# Patient Record
Sex: Female | Born: 1935 | Race: White | Hispanic: No | Smoking: Former smoker
Health system: Southern US, Community
[De-identification: ages and names within clinical notes are randomized; demographics above are authoritative.]

## PROBLEM LIST (undated history)

## (undated) DIAGNOSIS — E079 Disorder of thyroid, unspecified: Secondary | ICD-10-CM

## (undated) DIAGNOSIS — I10 Essential (primary) hypertension: Secondary | ICD-10-CM

## (undated) DIAGNOSIS — F0390 Unspecified dementia without behavioral disturbance: Secondary | ICD-10-CM

## (undated) DIAGNOSIS — K279 Peptic ulcer, site unspecified, unspecified as acute or chronic, without hemorrhage or perforation: Secondary | ICD-10-CM

## (undated) DIAGNOSIS — E039 Hypothyroidism, unspecified: Secondary | ICD-10-CM

## (undated) DIAGNOSIS — K219 Gastro-esophageal reflux disease without esophagitis: Secondary | ICD-10-CM

## (undated) DIAGNOSIS — I2510 Atherosclerotic heart disease of native coronary artery without angina pectoris: Secondary | ICD-10-CM

## (undated) DIAGNOSIS — M1990 Unspecified osteoarthritis, unspecified site: Secondary | ICD-10-CM

## (undated) HISTORY — PX: ABDOMINAL HYSTERECTOMY: SHX81

---

## 2009-04-02 ENCOUNTER — Ambulatory Visit (HOSPITAL_COMMUNITY): Admission: RE | Admit: 2009-04-02 | Discharge: 2009-04-02 | Payer: Self-pay | Admitting: Internal Medicine

## 2009-04-02 IMAGING — CR DG CHEST 2V
2 series · 2 of 2 positions shown · non-contrast
Comparison: None available.

CLINICAL DATA: Cough.

CHEST - 2 VIEW

[w chest pa]
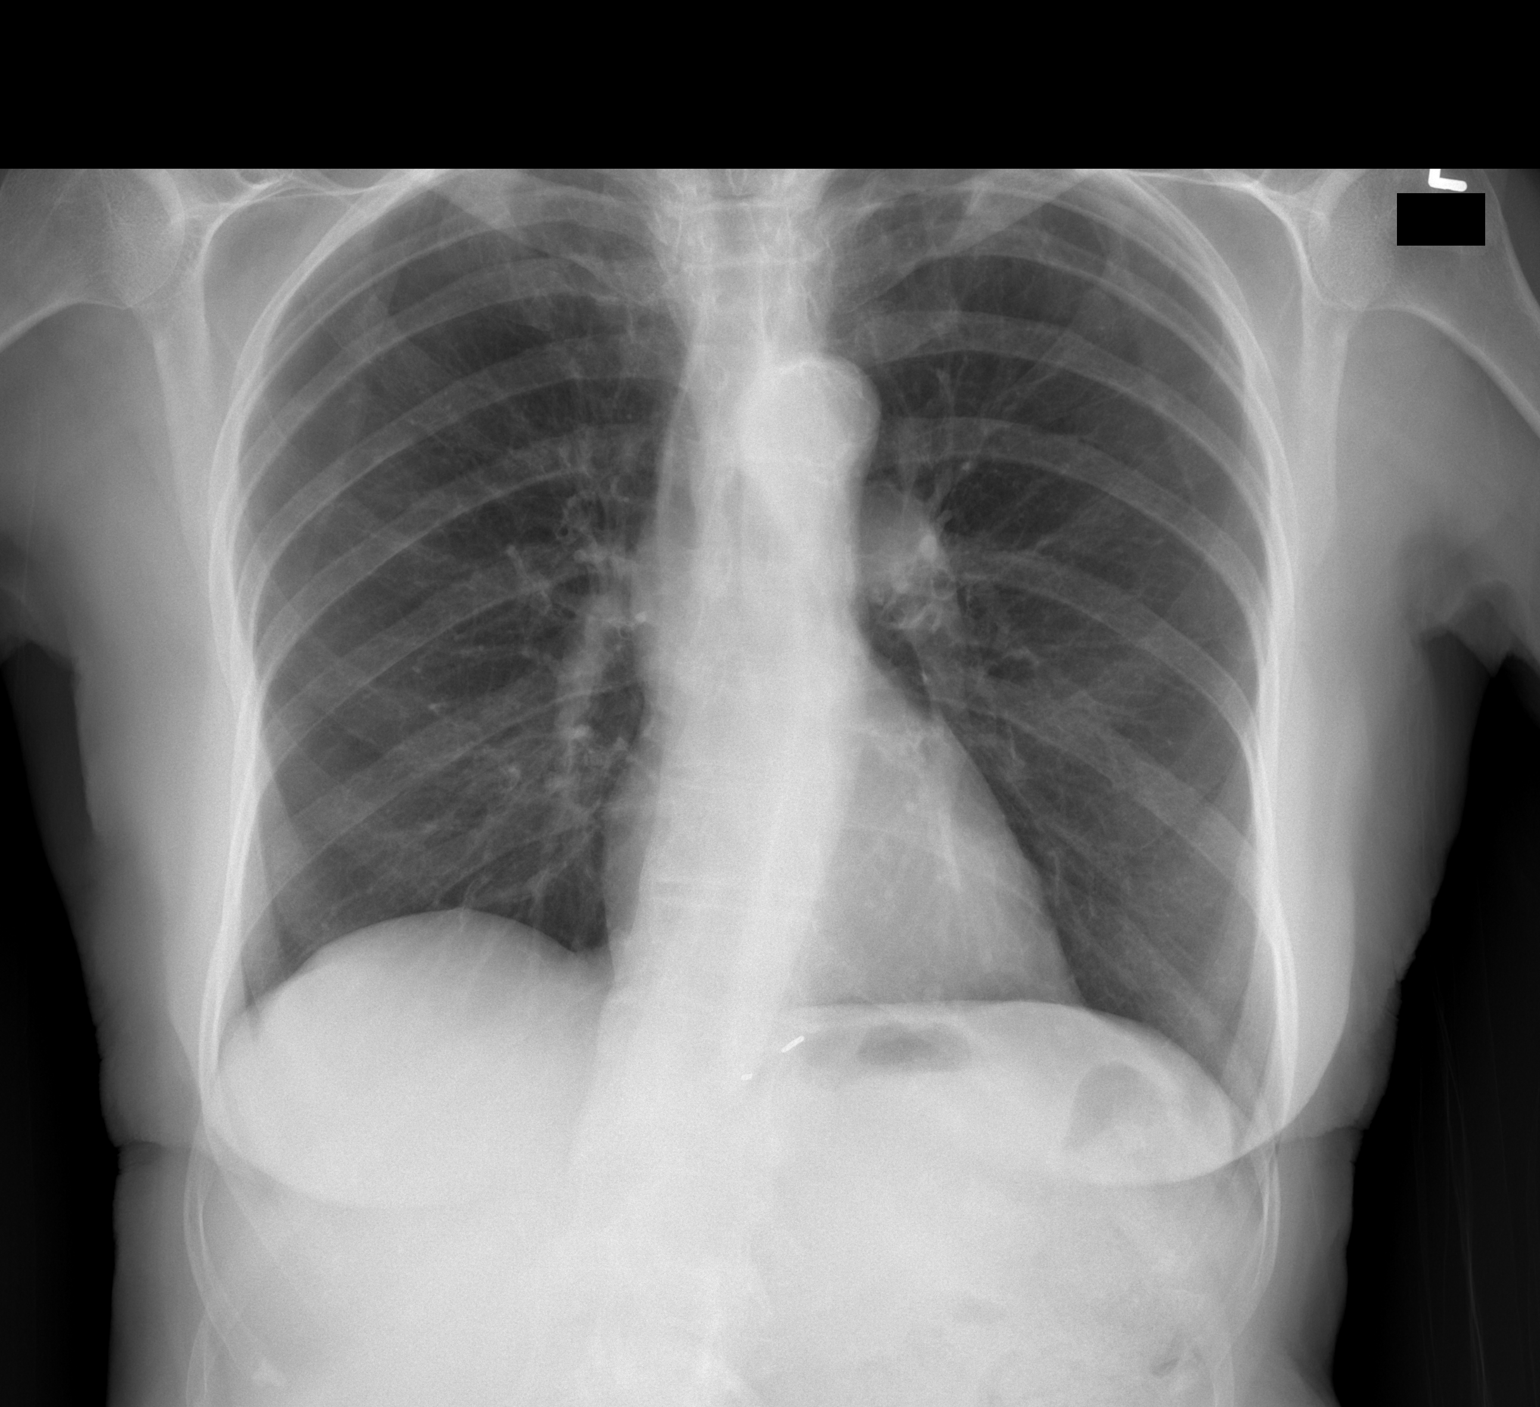

[w chest lat]
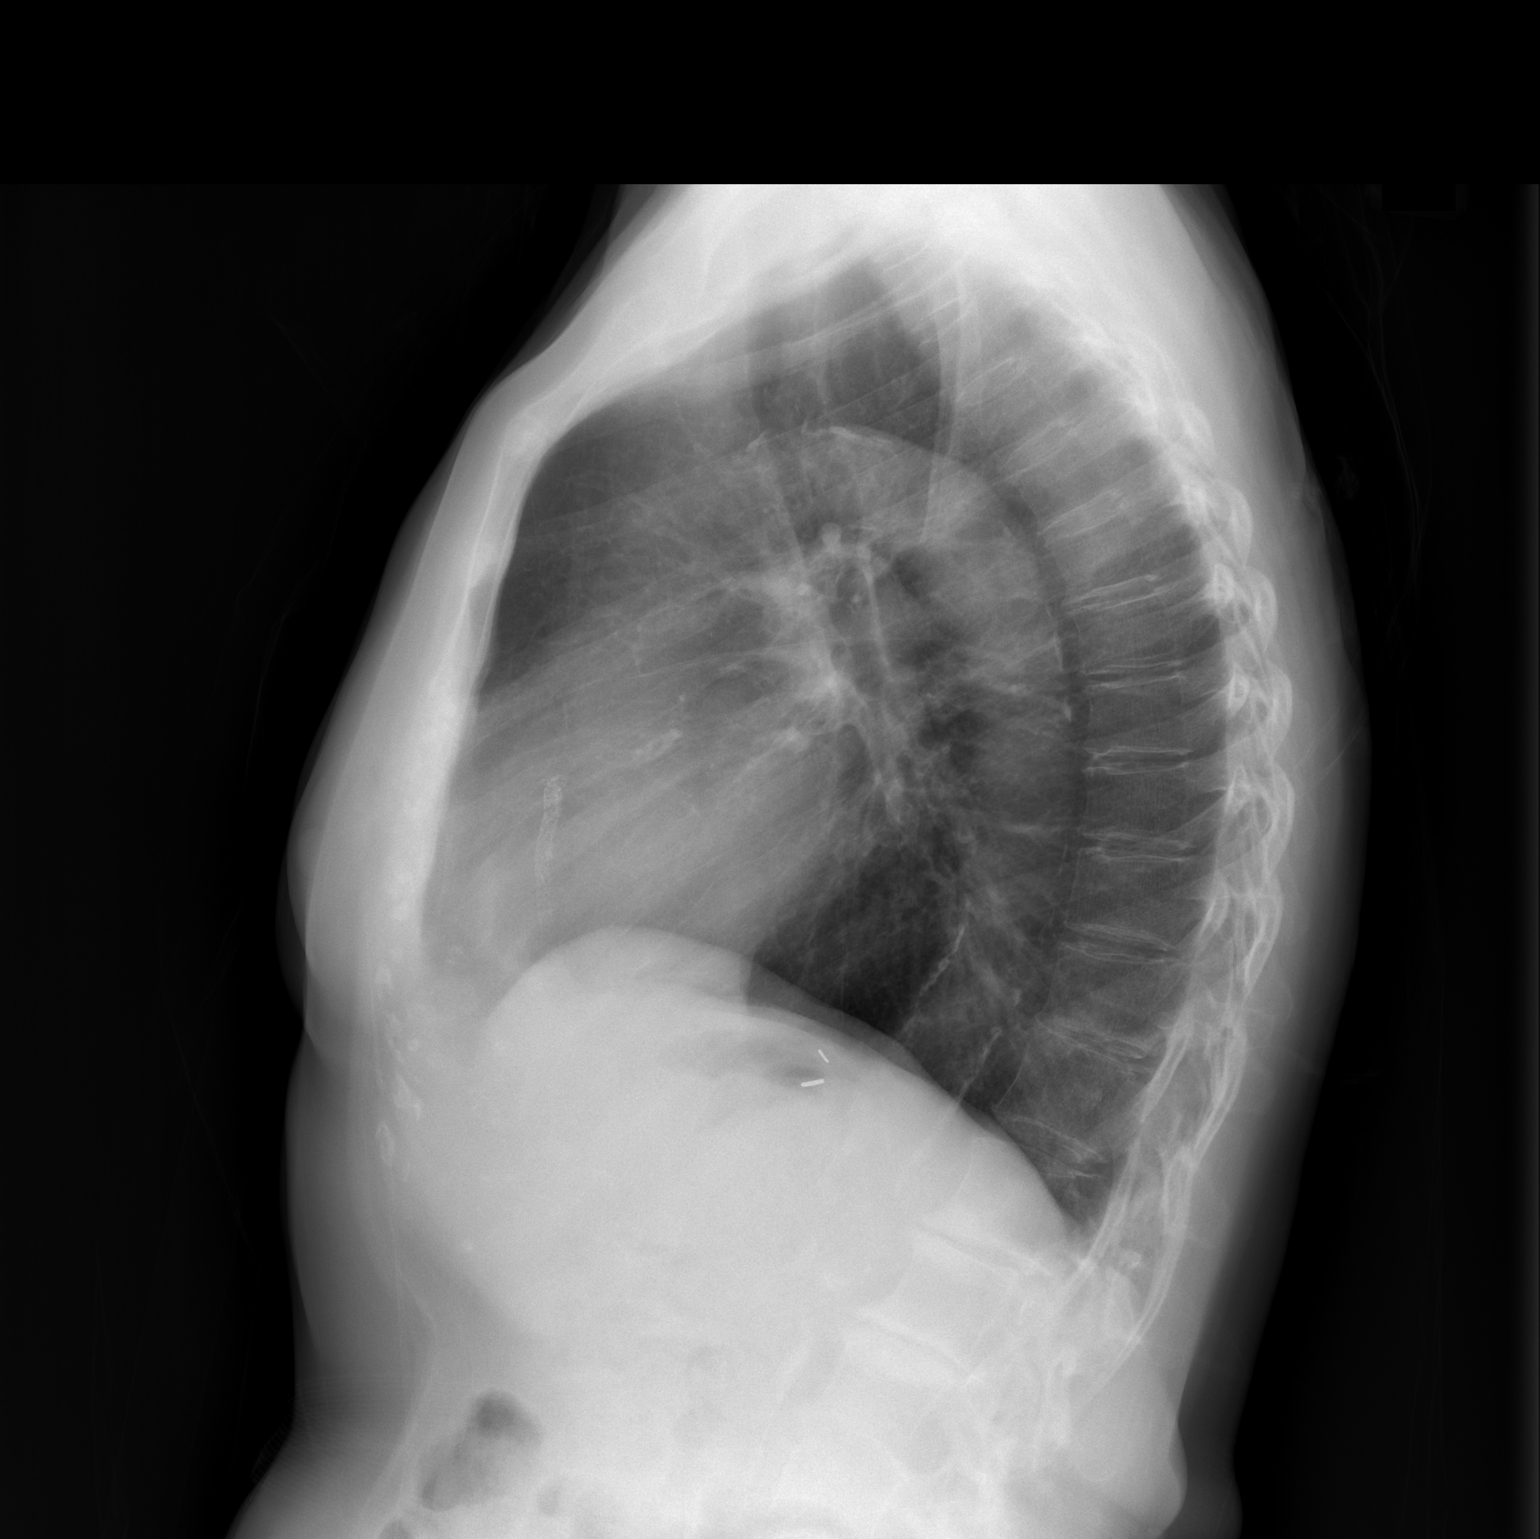

[2 of 2 positions shown; findings below may reference images not displayed]

FINDINGS: Lungs are clear.  Heart size is normal.  Coronary artery
stent noted.  No pleural effusion.
IMPRESSION: No acute disease.

## 2012-01-20 ENCOUNTER — Other Ambulatory Visit: Payer: Self-pay | Admitting: Internal Medicine

## 2012-01-23 ENCOUNTER — Other Ambulatory Visit: Payer: Self-pay | Admitting: Internal Medicine

## 2012-01-24 ENCOUNTER — Ambulatory Visit
Admission: RE | Admit: 2012-01-24 | Discharge: 2012-01-24 | Disposition: A | Discharge status: Home / Self Care | Payer: Medicare Other | Source: Ambulatory Visit | Attending: Internal Medicine | Admitting: Internal Medicine

## 2012-01-24 MED ORDER — IOHEXOL 300 MG/ML  SOLN
100.0000 mL | Freq: Once | INTRAMUSCULAR | Status: AC | PRN
  Administered 2012-01-24: 100 mL via INTRAVENOUS

## 2012-01-29 ENCOUNTER — Other Ambulatory Visit: Payer: Self-pay

## 2013-03-08 ENCOUNTER — Encounter (HOSPITAL_COMMUNITY): Payer: Self-pay | Admitting: Emergency Medicine

## 2013-03-08 ENCOUNTER — Emergency Department (HOSPITAL_COMMUNITY): Payer: Medicare Other

## 2013-03-08 ENCOUNTER — Observation Stay (HOSPITAL_COMMUNITY)
Admission: EM | Admit: 2013-03-08 | Discharge: 2013-03-10 | Disposition: A | Discharge status: Home / Self Care | Payer: Medicare Other | Attending: Internal Medicine | Admitting: Internal Medicine

## 2013-03-08 DIAGNOSIS — R1110 Vomiting, unspecified: Secondary | ICD-10-CM | POA: Insufficient documentation

## 2013-03-08 DIAGNOSIS — R112 Nausea with vomiting, unspecified: Secondary | ICD-10-CM | POA: Diagnosis present

## 2013-03-08 DIAGNOSIS — K277 Chronic peptic ulcer, site unspecified, without hemorrhage or perforation: Secondary | ICD-10-CM | POA: Insufficient documentation

## 2013-03-08 DIAGNOSIS — D649 Anemia, unspecified: Secondary | ICD-10-CM | POA: Diagnosis present

## 2013-03-08 DIAGNOSIS — N183 Chronic kidney disease, stage 3 unspecified: Secondary | ICD-10-CM | POA: Diagnosis present

## 2013-03-08 DIAGNOSIS — R269 Unspecified abnormalities of gait and mobility: Secondary | ICD-10-CM | POA: Insufficient documentation

## 2013-03-08 DIAGNOSIS — I959 Hypotension, unspecified: Principal | ICD-10-CM | POA: Diagnosis present

## 2013-03-08 DIAGNOSIS — K219 Gastro-esophageal reflux disease without esophagitis: Secondary | ICD-10-CM | POA: Insufficient documentation

## 2013-03-08 DIAGNOSIS — E079 Disorder of thyroid, unspecified: Secondary | ICD-10-CM | POA: Diagnosis present

## 2013-03-08 DIAGNOSIS — R5381 Other malaise: Secondary | ICD-10-CM | POA: Insufficient documentation

## 2013-03-08 DIAGNOSIS — E039 Hypothyroidism, unspecified: Secondary | ICD-10-CM | POA: Insufficient documentation

## 2013-03-08 DIAGNOSIS — D509 Iron deficiency anemia, unspecified: Secondary | ICD-10-CM | POA: Insufficient documentation

## 2013-03-08 DIAGNOSIS — F0390 Unspecified dementia without behavioral disturbance: Secondary | ICD-10-CM | POA: Diagnosis present

## 2013-03-08 DIAGNOSIS — I2510 Atherosclerotic heart disease of native coronary artery without angina pectoris: Secondary | ICD-10-CM | POA: Diagnosis present

## 2013-03-08 DIAGNOSIS — R262 Difficulty in walking, not elsewhere classified: Secondary | ICD-10-CM | POA: Insufficient documentation

## 2013-03-08 DIAGNOSIS — I10 Essential (primary) hypertension: Secondary | ICD-10-CM | POA: Diagnosis present

## 2013-03-08 DIAGNOSIS — R531 Weakness: Secondary | ICD-10-CM

## 2013-03-08 HISTORY — DX: Peptic ulcer, site unspecified, unspecified as acute or chronic, without hemorrhage or perforation: K27.9

## 2013-03-08 HISTORY — DX: Atherosclerotic heart disease of native coronary artery without angina pectoris: I25.10

## 2013-03-08 HISTORY — DX: Hypothyroidism, unspecified: E03.9

## 2013-03-08 HISTORY — DX: Gastro-esophageal reflux disease without esophagitis: K21.9

## 2013-03-08 HISTORY — DX: Unspecified dementia, unspecified severity, without behavioral disturbance, psychotic disturbance, mood disturbance, and anxiety: F03.90

## 2013-03-08 HISTORY — DX: Essential (primary) hypertension: I10

## 2013-03-08 HISTORY — DX: Disorder of thyroid, unspecified: E07.9

## 2013-03-08 HISTORY — DX: Unspecified osteoarthritis, unspecified site: M19.90

## 2013-03-08 LAB — COMPREHENSIVE METABOLIC PANEL
ALT: 18 U/L (ref 0–35)
AST: 22 U/L (ref 0–37)
CO2: 21 mEq/L (ref 19–32)
Calcium: 8.7 mg/dL (ref 8.4–10.5)
Creatinine, Ser: 1.3 mg/dL — ABNORMAL HIGH (ref 0.50–1.10)
GFR calc non Af Amer: 35 mL/min — ABNORMAL LOW (ref >90)
Sodium: 141 mEq/L (ref 135–145)
Total Protein: 5.8 g/dL — ABNORMAL LOW (ref 6.0–8.3)

## 2013-03-08 LAB — CBC WITH DIFFERENTIAL/PLATELET
Basophils Absolute: 0.1 10*3/uL (ref 0.0–0.1)
Basophils Relative: 1 % (ref 0–1)
Eosinophils Absolute: 0.4 10*3/uL (ref 0.0–0.7)
Eosinophils Relative: 5 % (ref 0–5)
HCT: 28.5 % — ABNORMAL LOW (ref 36.0–46.0)
MCH: 26.8 pg (ref 26.0–34.0)
MCHC: 32.3 g/dL (ref 30.0–36.0)
MCV: 83.1 fL (ref 78.0–100.0)
Monocytes Absolute: 0.6 10*3/uL (ref 0.1–1.0)
Platelets: 319 10*3/uL (ref 150–400)
RDW: 16.1 % — ABNORMAL HIGH (ref 11.5–15.5)
WBC: 7.2 10*3/uL (ref 4.0–10.5)

## 2013-03-08 LAB — GLUCOSE, CAPILLARY: Glucose-Capillary: 134 mg/dL — ABNORMAL HIGH (ref 70–99)

## 2013-03-08 LAB — POCT I-STAT TROPONIN I

## 2013-03-08 LAB — CBC
HCT: 27.9 % — ABNORMAL LOW (ref 36.0–46.0)
Hemoglobin: 9 g/dL — ABNORMAL LOW (ref 12.0–15.0)
MCH: 26.7 pg (ref 26.0–34.0)
Platelets: 312 10*3/uL (ref 150–400)
RBC: 3.37 MIL/uL — ABNORMAL LOW (ref 3.87–5.11)

## 2013-03-08 LAB — OCCULT BLOOD, POC DEVICE: Fecal Occult Bld: NEGATIVE

## 2013-03-08 LAB — PROTIME-INR: Prothrombin Time: 14 seconds (ref 11.6–15.2)

## 2013-03-08 LAB — TROPONIN I: Troponin I: <0.3 ng/mL (ref <0.30)

## 2013-03-08 LAB — TYPE AND SCREEN: Antibody Screen: NEGATIVE

## 2013-03-08 MED ORDER — ACETAMINOPHEN 325 MG PO TABS
650.0000 mg | ORAL_TABLET | Freq: Four times a day (QID) | ORAL | Status: DC | PRN
  Administered 2013-03-08 – 2013-03-09 (×2): 650 mg via ORAL
  Filled 2013-03-08 (×2): qty 2

## 2013-03-08 MED ORDER — POTASSIUM CHLORIDE IN NACL 20-0.9 MEQ/L-% IV SOLN
INTRAVENOUS | Status: AC
  Administered 2013-03-08: 100 mL/h via INTRAVENOUS
  Administered 2013-03-09: 04:00:00 via INTRAVENOUS
  Filled 2013-03-08 (×2): qty 1000

## 2013-03-08 MED ORDER — ONDANSETRON HCL 4 MG PO TABS: 4.0000 mg | ORAL_TABLET | Freq: Four times a day (QID) | ORAL | Status: DC | PRN

## 2013-03-08 MED ORDER — SODIUM CHLORIDE 0.9 % IV SOLN
1000.0000 mL | INTRAVENOUS | Status: DC
  Administered 2013-03-08: 1000 mL via INTRAVENOUS

## 2013-03-08 MED ORDER — DEXTROSE 50 % IV SOLN
25.0000 mL | Freq: Once | INTRAVENOUS | Status: AC
  Administered 2013-03-08: 25 mL via INTRAVENOUS
  Filled 2013-03-08: qty 50

## 2013-03-08 MED ORDER — ATORVASTATIN CALCIUM 40 MG PO TABS
40.0000 mg | ORAL_TABLET | Freq: Every day | ORAL | Status: DC
  Administered 2013-03-08 – 2013-03-09 (×2): 40 mg via ORAL
  Filled 2013-03-08 (×3): qty 1

## 2013-03-08 MED ORDER — VENLAFAXINE HCL ER 75 MG PO CP24
75.0000 mg | ORAL_CAPSULE | Freq: Every day | ORAL | Status: DC
  Administered 2013-03-09 – 2013-03-10 (×2): 75 mg via ORAL
  Filled 2013-03-08 (×3): qty 1

## 2013-03-08 MED ORDER — VITAMIN D3 25 MCG (1000 UNIT) PO TABS
1000.0000 [IU] | ORAL_TABLET | Freq: Every day | ORAL | Status: DC
  Administered 2013-03-08 – 2013-03-09 (×2): 1000 [IU] via ORAL
  Filled 2013-03-08 (×3): qty 1

## 2013-03-08 MED ORDER — ALBUTEROL SULFATE (5 MG/ML) 0.5% IN NEBU: 2.5000 mg | INHALATION_SOLUTION | RESPIRATORY_TRACT | Status: DC | PRN

## 2013-03-08 MED ORDER — ACETAMINOPHEN 650 MG RE SUPP: 650.0000 mg | Freq: Four times a day (QID) | RECTAL | Status: DC | PRN

## 2013-03-08 MED ORDER — HEPARIN SODIUM (PORCINE) 5000 UNIT/ML IJ SOLN
5000.0000 [IU] | Freq: Three times a day (TID) | INTRAMUSCULAR | Status: DC
  Administered 2013-03-08 – 2013-03-10 (×5): 5000 [IU] via SUBCUTANEOUS
  Filled 2013-03-08 (×9): qty 1

## 2013-03-08 MED ORDER — ONDANSETRON HCL 4 MG/2ML IJ SOLN: 4.0000 mg | Freq: Four times a day (QID) | INTRAMUSCULAR | Status: DC | PRN

## 2013-03-08 MED ORDER — SODIUM CHLORIDE 0.9 % IV SOLN
1000.0000 mL | Freq: Once | INTRAVENOUS | Status: AC
  Administered 2013-03-08: 1000 mL via INTRAVENOUS

## 2013-03-08 MED ORDER — LEVOTHYROXINE SODIUM 50 MCG PO TABS
50.0000 ug | ORAL_TABLET | Freq: Every day | ORAL | Status: DC
  Administered 2013-03-09 – 2013-03-10 (×2): 50 ug via ORAL
  Filled 2013-03-08 (×3): qty 1

## 2013-03-08 MED ORDER — CLOPIDOGREL BISULFATE 75 MG PO TABS
75.0000 mg | ORAL_TABLET | Freq: Every day | ORAL | Status: DC
  Administered 2013-03-08 – 2013-03-10 (×3): 75 mg via ORAL
  Filled 2013-03-08 (×4): qty 1

## 2013-03-08 MED ORDER — ASPIRIN EC 81 MG PO TBEC
81.0000 mg | DELAYED_RELEASE_TABLET | Freq: Every day | ORAL | Status: DC
  Administered 2013-03-08 – 2013-03-09 (×2): 81 mg via ORAL
  Filled 2013-03-08 (×3): qty 1

## 2013-03-08 MED ORDER — SODIUM CHLORIDE 0.9 % IJ SOLN
3.0000 mL | Freq: Two times a day (BID) | INTRAMUSCULAR | Status: DC
  Administered 2013-03-09: 3 mL via INTRAVENOUS

## 2013-03-08 NOTE — ED Notes (Addendum)
Pt reports to the ED for eval of hypotension and generalized weakness. Pt is an 80 y.o. Female who was at an adult day care when she began complaining of generalized weakness and was lowered to the ground. Pt denies any pain. Upon arrival pts BP was 69/35. Pt is pale and clammy. Pt has also had 4 episodes of emesis en route. No blood was noted in the emesis. Pt was given 500 mL of fluid and 4 mg of Zofran and BP came up to 120/50 mm/hg. Pts HR 70s-80s and regular. 12 lead showed NSR. CBG 84 mg/dl. Pt A&O at baseline. Pt has hx of dementia. BP 108/51 at this time. Denies CP, SOB, or abdominal pain.

## 2013-03-08 NOTE — ED Provider Notes (Signed)
CSN: [id number]     Arrival date & time 03/08/13  1124 History  First MD Initiated Contact with Patient 03/08/13 1134     Chief Complaint  Patient presents with  . Weakness    HPI Pt presents to the ED after episodes of generalized weakness and vomiting.  Pt resides of an adult day care.  She complained of feeling weak and was lowered to the ground. EMS was contacted they took her blood pressure. It was 69/35. It was noted that patient appeared clammy and pale. While being transported, she had 4 episodes of vomiting. There is no evidence of blood. Patient was given 500 L of fluid and her blood pressure improved to 120/50. Patient states she does not recall feeling poorly this morning. Her symptoms started somewhat suddenly. She denies any complaints of chest pain, fever, headache, shortness of breath, or abdominal pain. She has not noticed any blood in her stool. Patient does have history of dementia and her memory of some of the events is somewhat limited Past Medical History  Diagnosis Date  . Dementia   . Hypertension   . GERD (gastroesophageal reflux disease)   . Thyroid disease   . PUD (peptic ulcer disease)    History reviewed. No pertinent past surgical history. No family history on file. History  Substance Use Topics  . Smoking status: Never Smoker   . Smokeless tobacco: Never Used  . Alcohol Use: No   OB History   Grav Para Term Preterm Abortions TAB SAB Ect Mult Living                 Review of Systems  All other systems reviewed and are negative.    Allergies  Review of patient's allergies indicates no known allergies.  Home Medications   Current Outpatient Rx  Name  Route  Sig  Dispense  Refill  . aspirin EC 81 MG tablet   Oral   Take 81 mg by mouth daily.         Marland Kitchen atorvastatin (LIPITOR) 40 MG tablet   Oral   Take 40 mg by mouth daily.         . cholecalciferol (VITAMIN D) 1000 UNITS tablet   Oral   Take 1,000 Units by mouth daily.         .  clopidogrel (PLAVIX) 75 MG tablet   Oral   Take 75 mg by mouth daily with breakfast.         . diazoxide (PROGLYCEM) 50 MG/ML suspension   Oral   Take 50 mg by mouth daily.         Marland Kitchen levothyroxine (SYNTHROID, LEVOTHROID) 50 MCG tablet   Oral   Take 50 mcg by mouth daily before breakfast.         . venlafaxine XR (EFFEXOR-XR) 75 MG 24 hr capsule   Oral   Take 75 mg by mouth daily with breakfast.          BP 115/72  Pulse 69  Resp 18  SpO2 98% Physical Exam  Nursing note and vitals reviewed. HENT:  Head: Normocephalic and atraumatic.  Right Ear: External ear normal.  Left Ear: External ear normal.  Eyes: Conjunctivae are normal. Right eye exhibits no discharge. Left eye exhibits no discharge. No scleral icterus.  Neck: Neck supple. No tracheal deviation present.  Cardiovascular: Normal rate, regular rhythm and intact distal pulses.   Pulmonary/Chest: Effort normal and breath sounds normal. No stridor. No respiratory distress. She has no  wheezes. She has no rales.  Abdominal: Soft. Bowel sounds are normal. She exhibits no distension. There is no tenderness. There is no rebound and no guarding.  Genitourinary: Rectum normal. Guaiac negative stool.  Musculoskeletal: She exhibits no edema and no tenderness.  Neurological: She is alert. She has normal strength. No sensory deficit. Cranial nerve deficit:  no gross defecits noted. She exhibits normal muscle tone. She displays no seizure activity. Coordination normal.  Skin: Skin is warm. She is diaphoretic. There is pallor.  Psychiatric: She has a normal mood and affect.    ED Course  Procedures (including critical care time) Labs Review Labs Reviewed  CBC WITH DIFFERENTIAL - Abnormal; Notable for the following:    RBC 3.43 (*)    Hemoglobin 9.2 (*)    HCT 28.5 (*)    RDW 16.1 (*)    All other components within normal limits  COMPREHENSIVE METABOLIC PANEL - Abnormal; Notable for the following:    Glucose, Bld 42 (*)     BUN 27 (*)    Creatinine, Ser 1.30 (*)    Total Protein 5.8 (*)    Albumin 3.0 (*)    GFR calc non Af Amer 35 (*)    GFR calc Af Amer 41 (*)    All other components within normal limits  LACTIC ACID, PLASMA - Abnormal; Notable for the following:    Lactic Acid, Venous 2.3 (*)    All other components within normal limits  PROTIME-INR  LIPASE, BLOOD  GLUCOSE, CAPILLARY  URINALYSIS, ROUTINE W REFLEX MICROSCOPIC  POCT I-STAT TROPONIN I  OCCULT BLOOD, POC DEVICE  POCT CBG (FASTING - GLUCOSE)-MANUAL ENTRY  TYPE AND SCREEN  ABO/RH   Imaging Review Dg Chest Port 1 View  03/08/2013   CLINICAL DATA:  Weakness.  EXAM: PORTABLE CHEST - 1 VIEW  COMPARISON:  04/02/2009  FINDINGS: There is no evidence of pulmonary edema, consolidation, pneumothorax or pleural fluid. The heart size is normal. Stable atherosclerotic calcifications and mild tortuosity of the thoracic aorta identified.  IMPRESSION: No active disease.   Electronically Signed   By: Irish Lack M.D.   On: 03/08/2013 13:13    EKG Interpretation    Date/Time:  Monday March 08 2013 11:55:12 EST Ventricular Rate:  57 PR Interval:  182 QRS Duration: 105 QT Interval:  457 QTC Calculation: 445 R Axis:   75 Text Interpretation:  Sinus rhythm ECG OTHERWISE WITHIN NORMAL LIMITS No previous tracing Confirmed by Charliegh Vasudevan  MD-J, Maria Coin ([id number]) on 03/08/2013 12:01:23 PM           1285 BP has remained stable.  134/57.  Pt no longer is pale or diaphoretic 1345 BP still without complaints.  BP 115/72 MDM   1. Nausea and vomiting   2. Hypotension    Pt appears well at this time.  May have had an episode of vasovagal hypotension associated with her nausea and vomiting.  No complaints of pain.  Doubt surgical abdomen.  Anemia noted but no rectal bleeding at this time.  Afebrile, increased lactic acid but doubt sepsis.    Will admit for monitoring, serial evaluation and lab testing.Celene Kras, MD 03/08/13 [phone]

## 2013-03-08 NOTE — Progress Notes (Signed)
Completed report with [place] Rn from Ed awaiting patient arrival

## 2013-03-08 NOTE — Discharge Summary (Deleted)
[facility]  History and Physical  Sarah Daniel BJY:[id number] DOB: Sep 05, 1921 DOA: 03/08/2013  Referring physician: EDP PCP: Lillia Mountain, MD  Outpatient Specialists:  1. None  Chief Complaint: Weakness  HPI: Sarah Daniel is a 79 y.o. female with history of dementia, hypertension, hypothyroid, PUD/GERD, CAD, hypoglycemia on Diazoxide presented to the ED by EMS with complaints of generalized weakness and vomiting. Patient is a poor historian secondary to dementia. Patient resides with daughter and is fairly independent of activities of daily living. Daughter prepared her breakfast this morning and left for the day. Patient apparently had breakfast and took the bus to the adult day care center where she goes twice a week. Within half an hour, she apparently complained of feeling weak and was lowered to the ground. EMS was contacted and initial blood pressure was 69/35 mmHg. Patient was also clammy and pale. On route to ED, she had 4 nonbloody emesis. In the ED, patient received a 500 mL IV fluid bolus and her blood pressures have been stable. Blood sugars on BMP was in 40 mg range although CBG was 80. She received a half an amp of 50% dextrose. As per daughter, she was in her usual state of health without any complaints this morning. Patient denies chest pain, palpitations, dyspnea. In the ED, rectal exam by EDP showed normal stools and guaiac negative. Creatinine 1.3 and hemoglobin 9.2 with unknown baseline. Chest x-ray without acute disease. Hospitalist admission requested.   Review of Systems: All systems reviewed and apart from history of presenting illness, are negative.  Past Medical History  Diagnosis Date  . Dementia   . Hypertension   . GERD (gastroesophageal reflux disease)   . Thyroid disease   . PUD (peptic ulcer disease)   . Coronary artery disease    History reviewed. No pertinent past surgical history. Social History:  reports that she has never smoked. She has  never used smokeless tobacco. She reports that she does not drink alcohol or use illicit drugs.   No Known Allergies  No family history on file.  family history negative.   Prior to Admission medications   Medication Sig Start Date End Date Taking? Authorizing Provider  aspirin EC 81 MG tablet Take 81 mg by mouth daily.   Yes Historical Provider, MD  atorvastatin (LIPITOR) 40 MG tablet Take 40 mg by mouth daily.   Yes Historical Provider, MD  cholecalciferol (VITAMIN D) 1000 UNITS tablet Take 1,000 Units by mouth daily.   Yes Historical Provider, MD  clopidogrel (PLAVIX) 75 MG tablet Take 75 mg by mouth daily with breakfast.   Yes Historical Provider, MD  diazoxide (PROGLYCEM) 50 MG/ML suspension Take 50 mg by mouth daily.   Yes Historical Provider, MD  levothyroxine (SYNTHROID, LEVOTHROID) 50 MCG tablet Take 50 mcg by mouth daily before breakfast.   Yes Historical Provider, MD  venlafaxine XR (EFFEXOR-XR) 75 MG 24 hr capsule Take 75 mg by mouth daily with breakfast.   Yes Historical Provider, MD   Physical Exam: Filed Vitals:   03/08/13 1400 03/08/13 1415 03/08/13 1430 03/08/13 1445  BP: 112/55 109/54 111/50 118/59  Pulse: 77 71 73 75  Resp: 15 13 18 16   SpO2: 99% 96% 99% 97%     General exam: small built and thinly nourished female patient, lying comfortably supine on the gurney in no obvious distress.  Head, eyes and ENT: Nontraumatic and normocephalic. Pupils equally reacting to light and accommodation. Oral mucosa moist.  Neck: Supple. No JVD, carotid bruit  or thyromegaly.  Lymphatics: No lymphadenopathy.  Respiratory system: Clear to auscultation. No increased work of breathing.  Cardiovascular system: S1 and S2 heard, RRR. No JVD, murmurs, gallops, clicks or pedal edema. Telemetry: Sinus rhythm.   Gastrointestinal system: Abdomen is nondistended, soft and nontender. Normal bowel sounds heard. No organomegaly or masses appreciated.  Central nervous system: Alert and  oriented to person and partly to place only . No focal neurological deficits.  Extremities: Symmetric 5 x 5 power. Peripheral pulses symmetrically felt.   Skin: No rashes or acute findings.  Musculoskeletal system: Negative exam.  Psychiatry: Pleasant and cooperative.   Labs on Admission:  Basic Metabolic Panel:  Recent Labs Lab 03/08/13 1200  NA 141  K 3.5  CL 107  CO2 21  GLUCOSE 42*  BUN 27*  CREATININE 1.30*  CALCIUM 8.7   Liver Function Tests:  Recent Labs Lab 03/08/13 1200  AST 22  ALT 18  ALKPHOS 70  BILITOT 0.3  PROT 5.8*  ALBUMIN 3.0*    Recent Labs Lab 03/08/13 1200  LIPASE 53   No results found for this basename: AMMONIA,  in the last 168 hours CBC:  Recent Labs Lab 03/08/13 1200  WBC 7.2  NEUTROABS 5.1  HGB 9.2*  HCT 28.5*  MCV 83.1  PLT 319   Cardiac Enzymes: No results found for this basename: CKTOTAL, CKMB, CKMBINDEX, TROPONINI,  in the last 168 hours  BNP (last 3 results) No results found for this basename: PROBNP,  in the last 8760 hours CBG:  Recent Labs Lab 03/08/13 1344 03/08/13 1435  GLUCAP 80 134*    Radiological Exams on Admission: Dg Chest Port 1 View  03/08/2013   CLINICAL DATA:  Weakness.  EXAM: PORTABLE CHEST - 1 VIEW  COMPARISON:  04/02/2009  FINDINGS: There is no evidence of pulmonary edema, consolidation, pneumothorax or pleural fluid. The heart size is normal. Stable atherosclerotic calcifications and mild tortuosity of the thoracic aorta identified.  IMPRESSION: No active disease.   Electronically Signed   By: Irish Lack M.D.   On: 03/08/2013 13:13    EKG: Independently reviewed.  sinus bradycardia at 57 beats per minute without acute changes.   Assessment/Plan Principal Problem:   Hypotension Active Problems:   Nausea & vomiting   Weakness generalized   Anemia   CKD (chronic kidney disease), stage III   Dementia   Hypertension   Thyroid disease   Coronary artery disease   Hypotension  -  Unclear etiology.? Vasovagal from nausea and vomiting.  - Patient not on antihypertensives at home.  - Improved after IV fluids. Continue IV fluids overnight and monitor closely on telemetry   Nausea and vomiting - ? Viral gastroenteritis. No further episodes in the ED - Continue regular consistency diet and monitor closely.  Generalized weakness - Secondary to problem #1 and 2.  Anemia - Possibly chronic but no prior labs to compare. Follow CBC in a few hours. - Rectal exam showed normal colored stools and guaiac negative.  Stage III chronic kidney disease - Baseline creatinine is not known. Followup BMP in a.m.  History of hypoglycemia - Monitor CBGs closely. Continue diazoxide. - Not sure if she truly had a hypoglycemic episode in the ED.  Hypothyroidism  - continue Synthroid  History of CAD - Continue aspirin and Plavix.  History of dementia - Mental status at baseline.    Code Status: Full   Family Communication: Discussed with daughter at bedside.   Disposition Plan: Home when medically stable  Time spent: 60 minutes   HONGALGI,ANAND, MD, FACP, FHM. [facility] Pager [phone]  If 7PM-7AM, please contact night-coverage [email or web address] Password [facility] 03/08/2013, 3:31 PM

## 2013-03-08 NOTE — ED Notes (Signed)
CBG done. Blood sugar was 80

## 2013-03-09 LAB — URINALYSIS, ROUTINE W REFLEX MICROSCOPIC
Glucose, UA: NEGATIVE mg/dL
Hgb urine dipstick: NEGATIVE
Ketones, ur: NEGATIVE mg/dL
Leukocytes, UA: NEGATIVE
Protein, ur: NEGATIVE mg/dL
Urobilinogen, UA: 0.2 mg/dL (ref 0.0–1.0)

## 2013-03-09 LAB — TROPONIN I: Troponin I: <0.3 ng/mL (ref <0.30)

## 2013-03-09 LAB — CBC
Hemoglobin: 8.6 g/dL — ABNORMAL LOW (ref 12.0–15.0)
MCH: 26.7 pg (ref 26.0–34.0)
MCV: 82.9 fL (ref 78.0–100.0)
RBC: 3.22 MIL/uL — ABNORMAL LOW (ref 3.87–5.11)

## 2013-03-09 LAB — BASIC METABOLIC PANEL
BUN: 23 mg/dL (ref 6–23)
CO2: 19 mEq/L (ref 19–32)
Calcium: 8.4 mg/dL (ref 8.4–10.5)
Chloride: 106 mEq/L (ref 96–112)
Glucose, Bld: 95 mg/dL (ref 70–99)
Potassium: 4.4 mEq/L (ref 3.5–5.1)
Sodium: 136 mEq/L (ref 135–145)

## 2013-03-09 LAB — GLUCOSE, CAPILLARY
Glucose-Capillary: 102 mg/dL — ABNORMAL HIGH (ref 70–99)
Glucose-Capillary: 70 mg/dL (ref 70–99)
Glucose-Capillary: 92 mg/dL (ref 70–99)
Glucose-Capillary: 94 mg/dL (ref 70–99)

## 2013-03-09 LAB — IRON AND TIBC
Iron: 18 ug/dL — ABNORMAL LOW (ref 42–135)
Saturation Ratios: 5 % — ABNORMAL LOW (ref 20–55)
UIBC: 318 ug/dL (ref 125–400)

## 2013-03-09 LAB — URINE MICROSCOPIC-ADD ON

## 2013-03-09 LAB — VITAMIN B12: Vitamin B-12: 441 pg/mL (ref 211–911)

## 2013-03-09 MED ORDER — HYDROCODONE-ACETAMINOPHEN 5-325 MG PO TABS
1.0000 | ORAL_TABLET | Freq: Once | ORAL | Status: AC
  Administered 2013-03-09: 03:00:00 1 via ORAL
  Filled 2013-03-09: qty 1

## 2013-03-09 MED ORDER — PANTOPRAZOLE SODIUM 40 MG PO TBEC
40.0000 mg | DELAYED_RELEASE_TABLET | Freq: Every day | ORAL | Status: DC
  Administered 2013-03-09: 09:00:00 40 mg via ORAL
  Filled 2013-03-09: qty 1

## 2013-03-09 MED ORDER — POTASSIUM CHLORIDE IN NACL 20-0.9 MEQ/L-% IV SOLN
INTRAVENOUS | Status: DC
  Administered 2013-03-09: 16:00:00 75 mL/h via INTRAVENOUS
  Administered 2013-03-10: 06:00:00 via INTRAVENOUS
  Filled 2013-03-09 (×2): qty 1000

## 2013-03-09 NOTE — Progress Notes (Signed)
Subjective: No complaints  Objective: Vital signs in last 24 hours: Temp:  [98.2 F (36.8 C)-98.3 F (36.8 C)] 98.2 F (36.8 C) (12/02 0609) Pulse Rate:  [58-108] 61 (12/02 0609) Resp:  [13-22] 16 (12/02 0609) BP: (104-159)/(50-76) 159/76 mmHg (12/02 0609) SpO2:  [92 %-100 %] 98 % (12/02 0609) Weight:  [36 kg (79 lb 5.9 oz)-45.7 kg (100 lb 12 oz)] 45.7 kg (100 lb 12 oz) (12/02 0609) Weight change:  Last BM Date: 03/08/13  Intake/Output from previous day: 12/01 0701 - 12/02 0700 In: 1351.7 [P.O.:120; I.V.:1231.7] Out: -  Intake/Output this shift:    General appearance: alert Resp: clear to auscultation bilaterally Cardio: regular rate and rhythm, S1, S2 normal, no murmur, click, rub or gallop GI: soft, non-tender; bowel sounds normal; no masses,  no organomegaly Extremities: extremities normal, atraumatic, no cyanosis or edema  Lab Results:  Recent Labs  03/08/13 1800 03/09/13 0345  WBC 10.0 6.8  HGB 9.0* 8.6*  HCT 27.9* 26.7*  PLT 312 280   BMET  Recent Labs  03/08/13 1200 03/09/13 0345  NA 141 136  K 3.5 4.4  CL 107 106  CO2 21 19  GLUCOSE 42* 95  BUN 27* 23  CREATININE 1.30* 1.04  CALCIUM 8.7 8.4    Studies/Results: Dg Chest Port 1 View  03/08/2013   CLINICAL DATA:  Weakness.  EXAM: PORTABLE CHEST - 1 VIEW  COMPARISON:  04/02/2009  FINDINGS: There is no evidence of pulmonary edema, consolidation, pneumothorax or pleural fluid. The heart size is normal. Stable atherosclerotic calcifications and mild tortuosity of the thoracic aorta identified.  IMPRESSION: No active disease.   Electronically Signed   By: Irish Lack M.D.   On: 03/08/2013 13:13    Medications: I have reviewed the patient's current medications.  Assessment/Plan: Hypotension  - Unclear etiology.? Vasovagal from nausea and vomiting.  - Patient not on antihypertensives at home.  - Improved after IV fluids. Continue IV fluids overnight and monitor closely on telemetry  Nausea and  vomiting  . No further episodes   - Continue regular consistency diet and monitor closely.  Anemia  - Possibly chronic but no prior labs to compare. Basic lab anemia workup - Rectal exam showed normal colored stools and guaiac negative.  Stage III chronic kidney disease .  History of hypoglycemia  - Monitor CBGs closely. Hold diazoxide.  - Will discuss her history with daughter.  Hypothyroidism  - continue Synthroid, check tsh  History of CAD  - Continue aspirin and Plavix.  History of dementia  - Mental status at baseline.  Code Status: Full     LOS: 1 day   Marcin Holte JOSEPH 03/09/2013, 7:42 AM

## 2013-03-09 NOTE — Evaluation (Signed)
Physical Therapy Evaluation Patient Details Name: Sarah Daniel MRN: [id number] DOB: 1921/08/23 Today's Date: 03/09/2013 Time: 4098-1191 PT Time Calculation (min): 26 min  PT Assessment / Plan / Recommendation History of Present Illness  Pt. is an 79 y/o female admitted with weakness, hypotension and n/v.   Clinical Impression  Pt admitted with weakness, hypotension, n/v. Pt currently with functional limitations due to the deficits listed below (see PT Problem List). At this time no further skilled acute PT services indicated as pt. Is at baseline. Pt will benefit from skilled PT  In the outpatient setting to improve balance and decrease risk of falls.     PT Assessment  All further PT needs can be met in the next venue of care    Follow Up Recommendations  Outpatient PT    Does the patient have the potential to tolerate intense rehabilitation      Barriers to Discharge        Equipment Recommendations  None recommended by PT    Recommendations for Other Services     Frequency      Precautions / Restrictions Precautions Precautions: Fall Restrictions Weight Bearing Restrictions: No   Pertinent Vitals/Pain Denies pain      Mobility  Bed Mobility Bed Mobility: Supine to Sit Supine to Sit: 7: Independent Transfers Transfers: Stand to Sit;Sit to Stand Sit to Stand: 5: Supervision;From bed Stand to Sit: 5: Supervision;To chair/3-in-1 Ambulation/Gait Ambulation/Gait Assistance: 5: Supervision Ambulation Distance (Feet): 350 Feet Assistive device: None Ambulation/Gait Assistance Details: occasional increased lateral sway without LOB Gait Pattern: Within Functional Limits Gait velocity: WFL General Gait Details: pt. performed horizontal head turns with amb without LOB Stairs: No Wheelchair Mobility Wheelchair Mobility: No    Exercises     PT Diagnosis: Difficulty walking;Abnormality of gait  PT Problem List: Decreased balance;Decreased mobility;Decreased  cognition PT Treatment Interventions:       PT Goals(Current goals can be found in the care plan section) Acute Rehab PT Goals Patient Stated Goal: to go home PT Goal Formulation: With patient/family Time For Goal Achievement: 03/16/13 Potential to Achieve Goals: Good  Visit Information  Last PT Received On: 03/09/13 Assistance Needed: +1 History of Present Illness: Pt. is an 79 y/o female admitted with weakness, hypotension and n/v.        Prior Functioning  Home Living Family/patient expects to be discharged to:: Private residence Living Arrangements: Children Available Help at Discharge: Family;Other (Comment) ([facility]) Type of Home: House Home Access: Level entry Home Layout: One level;Able to live on main level with bedroom/bathroom Home Equipment: None Prior Function Level of Independence: Independent Comments: pt. overall very independent, at home up to 4 hours without supervision Communication Communication: No difficulties Dominant Hand: Right    Cognition  Cognition Arousal/Alertness: Awake/alert Behavior During Therapy: WFL for tasks assessed/performed Overall Cognitive Status: History of cognitive impairments - at baseline Memory: Decreased short-term memory    Extremity/Trunk Assessment Upper Extremity Assessment Upper Extremity Assessment: Overall WFL for tasks assessed Lower Extremity Assessment Lower Extremity Assessment: Overall WFL for tasks assessed Cervical / Trunk Assessment Cervical / Trunk Assessment: Normal   Balance Balance Balance Assessed: Yes Static Sitting Balance Static Sitting - Balance Support: Feet supported;No upper extremity supported Static Sitting - Level of Assistance: 7: Independent Static Sitting - Comment/# of Minutes: 5 Static Standing Balance Static Standing - Balance Support: No upper extremity supported;During functional activity Static Standing - Level of Assistance: 5: Stand by assistance   End of Session  PT - End of Session Equipment Utilized During Treatment: Gait belt Activity Tolerance: Patient tolerated treatment well Patient left: in chair;with call bell/phone within reach;with family/visitor present Nurse Communication: Mobility status  GP Functional Assessment Tool Used: clinical judgement Functional Limitation: Mobility: Walking and moving around Mobility: Walking and Moving Around Current Status (Z6109): At least 1 percent but less than 20 percent impaired, limited or restricted Mobility: Walking and Moving Around Goal Status [phone]): At least 1 percent but less than 20 percent impaired, limited or restricted Mobility: Walking and Moving Around Discharge Status [id number]): At least 1 percent but less than 20 percent impaired, limited or restricted   Ernestene Mention 03/09/2013, 4:17 PM  Clarita Crane, PT, DPT [phone]

## 2013-03-09 NOTE — Progress Notes (Signed)
UR completed 

## 2013-03-09 NOTE — Progress Notes (Signed)
This nurse phoned [facility] and informed her that Dr. Valentina Lucks would be giving her a call in r/t her mother's status. Voiced her appreciation.

## 2013-03-09 NOTE — Progress Notes (Signed)
Received call from [facility] daughter, wanting to know update on her mother's status, this nurse unable to ascertain from chart if there would be a discharge . Took name and number and told her this nurse would return her call as soon as i called the MD. Spoke with Dr. Valentina Lucks VS given and BS given. He took daughter's number and stated he would call her.

## 2013-03-09 NOTE — Progress Notes (Signed)
Pt's B/P=180/76;rechecked after 56m9ns.B/P=165/78;DR.Stoneking MD on call was called & made aware ;ordered to just continue monitoring pt.

## 2013-03-10 LAB — BASIC METABOLIC PANEL
BUN: 16 mg/dL (ref 6–23)
Calcium: 8.7 mg/dL (ref 8.4–10.5)
Creatinine, Ser: 0.89 mg/dL (ref 0.50–1.10)
GFR calc Af Amer: 65 mL/min — ABNORMAL LOW (ref >90)
Sodium: 143 mEq/L (ref 135–145)

## 2013-03-10 LAB — CBC
Platelets: 322 10*3/uL (ref 150–400)
RBC: 3.37 MIL/uL — ABNORMAL LOW (ref 3.87–5.11)
RDW: 16.2 % — ABNORMAL HIGH (ref 11.5–15.5)
WBC: 6 10*3/uL (ref 4.0–10.5)

## 2013-03-10 LAB — GLUCOSE, CAPILLARY
Glucose-Capillary: 104 mg/dL — ABNORMAL HIGH (ref 70–99)
Glucose-Capillary: 95 mg/dL (ref 70–99)

## 2013-03-10 MED ORDER — PANTOPRAZOLE SODIUM 40 MG PO TBEC: 40.0000 mg | DELAYED_RELEASE_TABLET | Freq: Every day | ORAL | Status: DC

## 2013-03-10 NOTE — Progress Notes (Signed)
Sarah Daniel [place] to be D/C'd Home per MD order.  Discussed with the patient and all questions fully answered.    Medication List    STOP taking these medications       PROGLYCEM 50 MG/ML suspension  Generic drug:  diazoxide      TAKE these medications       aspirin EC 81 MG tablet  Take 81 mg by mouth daily.     atorvastatin 40 MG tablet  Commonly known as:  LIPITOR  Take 40 mg by mouth daily.     cholecalciferol 1000 UNITS tablet  Commonly known as:  VITAMIN D  Take 1,000 Units by mouth daily.     clopidogrel 75 MG tablet  Commonly known as:  PLAVIX  Take 75 mg by mouth daily with breakfast.     levothyroxine 50 MCG tablet  Commonly known as:  SYNTHROID, LEVOTHROID  Take 50 mcg by mouth daily before breakfast.     pantoprazole 40 MG tablet  Commonly known as:  PROTONIX  Take 1 tablet (40 mg total) by mouth daily.     venlafaxine XR 75 MG 24 hr capsule  Commonly known as:  EFFEXOR-XR  Take 75 mg by mouth daily with breakfast.        VVS, Skin clean, dry and intact without evidence of skin break down, no evidence of skin tears noted. Scattered bruising to arms and legs.  IV catheter discontinued intact. Site without signs and symptoms of complications. Dressing and pressure applied.  An After Visit Summary was printed and given to the patient's daughter Patient escorted via [facility], and D/C home via private auto.  [place], [employer] 03/10/2013 9:15 AM

## 2013-03-10 NOTE — Care Management Note (Signed)
    Page 1 of 1   03/10/2013     11:34:19 AM   CARE MANAGEMENT NOTE 03/10/2013  Patient:  Sarah Daniel, Sarah Daniel   Account Number:  [id number]  Date Initiated:  03/10/2013  Documentation initiated by:  Letha Cape  Subjective/Objective Assessment:   dx hypotension  admit as observation.     Action/Plan:   Anticipated DC Date:  03/10/2013   Anticipated DC Plan:  HOME/SELF CARE      DC Planning Services  CM consult      Choice offered to / List presented to:             Status of service:  Completed, signed off Medicare Important Message given?   (If response is "NO", the following Medicare IM given date fields will be blank) Date Medicare IM given:   Date Additional Medicare IM given:    Discharge Disposition:  HOME/SELF CARE  Per UR Regulation:  Reviewed for med. necessity/level of care/duration of stay  If discussed at Long Length of Stay Meetings, dates discussed:    Comments:  03/10/13 11:33 Letha Cape RN, BSN [phone] patient lives with daughter, patient dc today, no NCM referral received, no needs anticipated.

## 2013-03-11 NOTE — Discharge Summary (Signed)
Physician Discharge Summary  Patient ID: Sarah Daniel MRN: [id number] DOB/AGE: 12-03-1921 79 y.o.  Admit date: 03/08/2013 Discharge date: 03/11/2013  Admission Diagnoses: Hypotension Hypoglycemia Nausea and vomiting Anemia Dementia Hypertension Hypothyroidism Coronary artery disease   Discharge Diagnoses:  Principal Problem:   Hypotension Active Problems: Hypoglycemia   Nausea & vomiting  Iron deficiency Anemia   Dementia   Hypertension   Hypothyroidism Coronary artery disease   Discharged Condition: good  Hospital Course: The patient was admitted on December 1. She breakfast at about 8:30 and then went to her adult daycare. At about 10 AM she began feeling weak and EMS was called blood pressure was initially 70/35 patient was clammy and pale and on route to the ER had 5 episodes of emesis. Her blood pressure was stabilized in ER with IV fluid bolus. Initial blood sugar was 40 and creatinine was 1.3. Hemoglobin 9.2. The patient was admitted and remained on IV fluids. Hemoglobin dropped as low as 8.9 although no evidence of active bleeding, iron studies suggested iron deficiency, B12 and folate normal. Blood sugars were monitored and remained in the normal range no document hypoglycemia. His oxide was discontinued.  Cardiac enzymes were negative. Discussion with daughter, patient has had episodes of hypoglycemia for several years usually occurring couple hours after a meal. She may have had a workup for this prior to moving to [facility], she's been on diazoxide to treat this.   Consults: None  Significant Diagnostic Studies: labs: At discharge sodium 143 potassium 4.0 chloride 111 bicarbonate 22 BUN 16 creatinine 0.89 , CBC WBC 6.3 hemoglobin 8.9 and platelets 322 and radiology: CXR: normal  Treatments: IV hydration  Discharge Exam: Blood pressure 142/82, pulse 79, temperature 98.2 F (36.8 C), temperature source Oral, resp. rate 18, height 5\' 3"  (1.6 m), weight 45.7 kg (100  lb 12 oz), SpO2 99.00%. Resp: clear to auscultation bilaterally Cardio: regular rate and rhythm, S1, S2 normal, no murmur, click, rub or gallop  Disposition: 01-Home or Self Care     Medication List    STOP taking these medications       PROGLYCEM 50 MG/ML suspension  Generic drug:  diazoxide      TAKE these medications       aspirin EC 81 MG tablet  Take 81 mg by mouth daily.     atorvastatin 40 MG tablet  Commonly known as:  LIPITOR  Take 40 mg by mouth daily.     cholecalciferol 1000 UNITS tablet  Commonly known as:  VITAMIN D  Take 1,000 Units by mouth daily.     clopidogrel 75 MG tablet  Commonly known as:  PLAVIX  Take 75 mg by mouth daily with breakfast.     levothyroxine 50 MCG tablet  Commonly known as:  SYNTHROID, LEVOTHROID  Take 50 mcg by mouth daily before breakfast.     pantoprazole 40 MG tablet  Commonly known as:  PROTONIX  Take 1 tablet (40 mg total) by mouth daily.     venlafaxine XR 75 MG 24 hr capsule  Commonly known as:  EFFEXOR-XR  Take 75 mg by mouth daily with breakfast.           Follow-up Information   Follow up with Lillia Mountain, MD. (next scheduled visit)    Specialty:  Internal Medicine   Contact information:   [address] [place] [address] [phone]       Signed: Lillia Mountain 03/11/2013, 7:48 AM

## 2013-03-11 NOTE — H&P (Signed)
[facility]  History and Physical  Ethlyn Moll MRN:[id number] DOB: 01/20/1922 DOA: 03/08/2013  Referring physician: EDP PCP: GRIFFIN,JOHN JOSEPH, MD  Outpatient Specialists:  1. None  Chief Complaint: Weakness  HPI: Chavie Ryle is a 79 y.o. female with history of dementia, hypertension, hypothyroid, PUD/GERD, CAD, hypoglycemia on Diazoxide presented to the ED by EMS with complaints of generalized weakness and vomiting. Patient is a poor historian secondary to dementia. Patient resides with daughter and is fairly independent of activities of daily living. Daughter prepared her breakfast this morning and left for the day. Patient apparently had breakfast and took the bus to the adult day care center where she goes twice a week. Within half an hour, she apparently complained of feeling weak and was lowered to the ground. EMS was contacted and initial blood pressure was 69/35 mmHg. Patient was also clammy and pale. On route to ED, she had 4 nonbloody emesis. In the ED, patient received a 500 mL IV fluid bolus and her blood pressures have been stable. Blood sugars on BMP was in 40 mg range although CBG was 80. She received a half an amp of 50% dextrose. As per daughter, she was in her usual state of health without any complaints this morning. Patient denies chest pain, palpitations, dyspnea. In the ED, rectal exam by EDP showed normal stools and guaiac negative. Creatinine 1.3 and hemoglobin 9.2 with unknown baseline. Chest x-ray without acute disease. Hospitalist admission requested.   Review of Systems: All systems reviewed and apart from history of presenting illness, are negative.  Past Medical History  Diagnosis Date  . Dementia   . Hypertension   . GERD (gastroesophageal reflux disease)   . Thyroid disease   . PUD (peptic ulcer disease)   . Coronary artery disease    History reviewed. No pertinent past surgical history. Social History:  reports that she has never smoked. She has  never used smokeless tobacco. She reports that she does not drink alcohol or use illicit drugs.   No Known Allergies  No family history on file.  family history negative.   Prior to Admission medications   Medication Sig Start Date End Date Taking? Authorizing Provider  aspirin EC 81 MG tablet Take 81 mg by mouth daily.   Yes Historical Provider, MD  atorvastatin (LIPITOR) 40 MG tablet Take 40 mg by mouth daily.   Yes Historical Provider, MD  cholecalciferol (VITAMIN D) 1000 UNITS tablet Take 1,000 Units by mouth daily.   Yes Historical Provider, MD  clopidogrel (PLAVIX) 75 MG tablet Take 75 mg by mouth daily with breakfast.   Yes Historical Provider, MD  diazoxide (PROGLYCEM) 50 MG/ML suspension Take 50 mg by mouth daily.   Yes Historical Provider, MD  levothyroxine (SYNTHROID, LEVOTHROID) 50 MCG tablet Take 50 mcg by mouth daily before breakfast.   Yes Historical Provider, MD  venlafaxine XR (EFFEXOR-XR) 75 MG 24 hr capsule Take 75 mg by mouth daily with breakfast.   Yes Historical Provider, MD   Physical Exam: Filed Vitals:   03/08/13 1400 03/08/13 1415 03/08/13 1430 03/08/13 1445  BP: 112/55 109/54 111/50 118/59  Pulse: 77 71 73 75  Resp: 15 13 18 16  SpO2: 99% 96% 99% 97%     General exam: small built and thinly nourished female patient, lying comfortably supine on the gurney in no obvious distress.  Head, eyes and ENT: Nontraumatic and normocephalic. Pupils equally reacting to light and accommodation. Oral mucosa moist.  Neck: Supple. No JVD, carotid bruit   or thyromegaly.  Lymphatics: No lymphadenopathy.  Respiratory system: Clear to auscultation. No increased work of breathing.  Cardiovascular system: S1 and S2 heard, RRR. No JVD, murmurs, gallops, clicks or pedal edema. Telemetry: Sinus rhythm.   Gastrointestinal system: Abdomen is nondistended, soft and nontender. Normal bowel sounds heard. No organomegaly or masses appreciated.  Central nervous system: Alert and  oriented to person and partly to place only . No focal neurological deficits.  Extremities: Symmetric 5 x 5 power. Peripheral pulses symmetrically felt.   Skin: No rashes or acute findings.  Musculoskeletal system: Negative exam.  Psychiatry: Pleasant and cooperative.   Labs on Admission:  Basic Metabolic Panel:  Recent Labs Lab 03/08/13 1200  NA 141  K 3.5  CL 107  CO2 21  GLUCOSE 42*  BUN 27*  CREATININE 1.30*  CALCIUM 8.7   Liver Function Tests:  Recent Labs Lab 03/08/13 1200  AST 22  ALT 18  ALKPHOS 70  BILITOT 0.3  PROT 5.8*  ALBUMIN 3.0*    Recent Labs Lab 03/08/13 1200  LIPASE 53   No results found for this basename: AMMONIA,  in the last 168 hours CBC:  Recent Labs Lab 03/08/13 1200  WBC 7.2  NEUTROABS 5.1  HGB 9.2*  HCT 28.5*  MCV 83.1  PLT 319   Cardiac Enzymes: No results found for this basename: CKTOTAL, CKMB, CKMBINDEX, TROPONINI,  in the last 168 hours  BNP (last 3 results) No results found for this basename: PROBNP,  in the last 8760 hours CBG:  Recent Labs Lab 03/08/13 1344 03/08/13 1435  GLUCAP 80 134*    Radiological Exams on Admission: Dg Chest Port 1 View  03/08/2013   CLINICAL DATA:  Weakness.  EXAM: PORTABLE CHEST - 1 VIEW  COMPARISON:  04/02/2009  FINDINGS: There is no evidence of pulmonary edema, consolidation, pneumothorax or pleural fluid. The heart size is normal. Stable atherosclerotic calcifications and mild tortuosity of the thoracic aorta identified.  IMPRESSION: No active disease.   Electronically Signed   By: Glenn  Yamagata M.D.   On: 03/08/2013 13:13    EKG: Independently reviewed.  sinus bradycardia at 57 beats per minute without acute changes.   Assessment/Plan Principal Problem:   Hypotension Active Problems:   Nausea & vomiting   Weakness generalized   Anemia   CKD (chronic kidney disease), stage III   Dementia   Hypertension   Thyroid disease   Coronary artery disease   Hypotension  -  Unclear etiology.? Vasovagal from nausea and vomiting.  - Patient not on antihypertensives at home.  - Improved after IV fluids. Continue IV fluids overnight and monitor closely on telemetry   Nausea and vomiting - ? Viral gastroenteritis. No further episodes in the ED - Continue regular consistency diet and monitor closely.  Generalized weakness - Secondary to problem #1 and 2.  Anemia - Possibly chronic but no prior labs to compare. Follow CBC in a few hours. - Rectal exam showed normal colored stools and guaiac negative.  Stage III chronic kidney disease - Baseline creatinine is not known. Followup BMP in a.m.  History of hypoglycemia - Monitor CBGs closely. Continue diazoxide. - Not sure if she truly had a hypoglycemic episode in the ED.  Hypothyroidism  - continue Synthroid  History of CAD - Continue aspirin and Plavix.  History of dementia - Mental status at baseline.    Code Status: Full   Family Communication: Discussed with daughter at bedside.   Disposition Plan: Home when medically stable     Time spent: 60 minutes   Maleia Weems, MD, FACP, FHM. [facility] Pager [phone]  If 7PM-7AM, please contact night-coverage [email or web address] Password [email or web address] 03/08/2013, 3:31 PM        

## 2014-01-29 ENCOUNTER — Emergency Department (HOSPITAL_COMMUNITY): Payer: Medicare Other

## 2014-01-29 ENCOUNTER — Inpatient Hospital Stay (HOSPITAL_COMMUNITY): Payer: Medicare Other

## 2014-01-29 ENCOUNTER — Inpatient Hospital Stay (HOSPITAL_COMMUNITY)
Admission: EM | Admit: 2014-01-29 | Discharge: 2014-02-01 | DRG: 065 | Disposition: A | Discharge status: Rehabilitation Facility | Payer: Medicare Other | Attending: Internal Medicine | Admitting: Internal Medicine

## 2014-01-29 ENCOUNTER — Encounter (HOSPITAL_COMMUNITY): Payer: Self-pay | Admitting: Emergency Medicine

## 2014-01-29 DIAGNOSIS — I10 Essential (primary) hypertension: Secondary | ICD-10-CM

## 2014-01-29 DIAGNOSIS — F329 Major depressive disorder, single episode, unspecified: Secondary | ICD-10-CM | POA: Diagnosis present

## 2014-01-29 DIAGNOSIS — I63239 Cerebral infarction due to unspecified occlusion or stenosis of unspecified carotid arteries: Secondary | ICD-10-CM

## 2014-01-29 DIAGNOSIS — Z8673 Personal history of transient ischemic attack (TIA), and cerebral infarction without residual deficits: Secondary | ICD-10-CM

## 2014-01-29 DIAGNOSIS — R1310 Dysphagia, unspecified: Secondary | ICD-10-CM | POA: Diagnosis present

## 2014-01-29 DIAGNOSIS — Z87891 Personal history of nicotine dependence: Secondary | ICD-10-CM | POA: Diagnosis not present

## 2014-01-29 DIAGNOSIS — R414 Neurologic neglect syndrome: Secondary | ICD-10-CM | POA: Diagnosis present

## 2014-01-29 DIAGNOSIS — Z7902 Long term (current) use of antithrombotics/antiplatelets: Secondary | ICD-10-CM

## 2014-01-29 DIAGNOSIS — R471 Dysarthria and anarthria: Secondary | ICD-10-CM | POA: Diagnosis present

## 2014-01-29 DIAGNOSIS — Z7982 Long term (current) use of aspirin: Secondary | ICD-10-CM

## 2014-01-29 DIAGNOSIS — E079 Disorder of thyroid, unspecified: Secondary | ICD-10-CM

## 2014-01-29 DIAGNOSIS — N183 Chronic kidney disease, stage 3 unspecified: Secondary | ICD-10-CM

## 2014-01-29 DIAGNOSIS — I739 Peripheral vascular disease, unspecified: Secondary | ICD-10-CM | POA: Diagnosis present

## 2014-01-29 DIAGNOSIS — Z90710 Acquired absence of both cervix and uterus: Secondary | ICD-10-CM

## 2014-01-29 DIAGNOSIS — IMO0001 Reserved for inherently not codable concepts without codable children: Secondary | ICD-10-CM

## 2014-01-29 DIAGNOSIS — I6521 Occlusion and stenosis of right carotid artery: Secondary | ICD-10-CM | POA: Diagnosis present

## 2014-01-29 DIAGNOSIS — E039 Hypothyroidism, unspecified: Secondary | ICD-10-CM | POA: Diagnosis present

## 2014-01-29 DIAGNOSIS — F0390 Unspecified dementia without behavioral disturbance: Secondary | ICD-10-CM | POA: Diagnosis present

## 2014-01-29 DIAGNOSIS — I63411 Cerebral infarction due to embolism of right middle cerebral artery: Secondary | ICD-10-CM | POA: Diagnosis present

## 2014-01-29 DIAGNOSIS — M1990 Unspecified osteoarthritis, unspecified site: Secondary | ICD-10-CM | POA: Diagnosis present

## 2014-01-29 DIAGNOSIS — G8104 Flaccid hemiplegia affecting left nondominant side: Secondary | ICD-10-CM

## 2014-01-29 DIAGNOSIS — R531 Weakness: Secondary | ICD-10-CM

## 2014-01-29 DIAGNOSIS — G8194 Hemiplegia, unspecified affecting left nondominant side: Secondary | ICD-10-CM | POA: Diagnosis present

## 2014-01-29 DIAGNOSIS — K219 Gastro-esophageal reflux disease without esophagitis: Secondary | ICD-10-CM | POA: Diagnosis present

## 2014-01-29 DIAGNOSIS — Z66 Do not resuscitate: Secondary | ICD-10-CM | POA: Diagnosis present

## 2014-01-29 DIAGNOSIS — I2510 Atherosclerotic heart disease of native coronary artery without angina pectoris: Secondary | ICD-10-CM | POA: Diagnosis present

## 2014-01-29 DIAGNOSIS — Z955 Presence of coronary angioplasty implant and graft: Secondary | ICD-10-CM | POA: Diagnosis not present

## 2014-01-29 DIAGNOSIS — I639 Cerebral infarction, unspecified: Secondary | ICD-10-CM

## 2014-01-29 DIAGNOSIS — Z79899 Other long term (current) drug therapy: Secondary | ICD-10-CM | POA: Diagnosis not present

## 2014-01-29 DIAGNOSIS — Z4659 Encounter for fitting and adjustment of other gastrointestinal appliance and device: Secondary | ICD-10-CM

## 2014-01-29 LAB — I-STAT CHEM 8, ED
BUN: 23 mg/dL (ref 6–23)
CALCIUM ION: 1.18 mmol/L (ref 1.13–1.30)
CHLORIDE: 104 meq/L (ref 96–112)
CREATININE: 0.9 mg/dL (ref 0.50–1.10)
Glucose, Bld: 152 mg/dL — ABNORMAL HIGH (ref 70–99)
HCT: 38 % (ref 36.0–46.0)
Hemoglobin: 12.9 g/dL (ref 12.0–15.0)
Potassium: 4.1 mEq/L (ref 3.7–5.3)
Sodium: 139 mEq/L (ref 137–147)
TCO2: 24 mmol/L (ref 0–100)

## 2014-01-29 LAB — CBC
HCT: 34.1 % — ABNORMAL LOW (ref 36.0–46.0)
HEMOGLOBIN: 10.8 g/dL — AB (ref 12.0–15.0)
MCH: 29.2 pg (ref 26.0–34.0)
MCHC: 31.7 g/dL (ref 30.0–36.0)
MCV: 92.2 fL (ref 78.0–100.0)
PLATELETS: 301 10*3/uL (ref 150–400)
RBC: 3.7 MIL/uL — AB (ref 3.87–5.11)
RDW: 15.8 % — ABNORMAL HIGH (ref 11.5–15.5)
WBC: 7.4 10*3/uL (ref 4.0–10.5)

## 2014-01-29 LAB — COMPREHENSIVE METABOLIC PANEL
ALK PHOS: 83 U/L (ref 39–117)
ALT: 29 U/L (ref 0–35)
AST: 26 U/L (ref 0–37)
Albumin: 3.6 g/dL (ref 3.5–5.2)
Anion gap: 13 (ref 5–15)
BUN: 23 mg/dL (ref 6–23)
CO2: 24 meq/L (ref 19–32)
Calcium: 9.4 mg/dL (ref 8.4–10.5)
Chloride: 101 mEq/L (ref 96–112)
Creatinine, Ser: 0.9 mg/dL (ref 0.50–1.10)
GFR calc non Af Amer: 55 mL/min — ABNORMAL LOW (ref >90)
GFR, EST AFRICAN AMERICAN: 63 mL/min — AB (ref >90)
Glucose, Bld: 154 mg/dL — ABNORMAL HIGH (ref 70–99)
Potassium: 4.3 mEq/L (ref 3.7–5.3)
SODIUM: 138 meq/L (ref 137–147)
Total Bilirubin: 0.3 mg/dL (ref 0.3–1.2)
Total Protein: 7 g/dL (ref 6.0–8.3)

## 2014-01-29 LAB — DIFFERENTIAL
Basophils Absolute: 0 10*3/uL (ref 0.0–0.1)
Basophils Relative: 1 % (ref 0–1)
EOS PCT: 1 % (ref 0–5)
Eosinophils Absolute: 0 10*3/uL (ref 0.0–0.7)
LYMPHS PCT: 7 % — AB (ref 12–46)
Lymphs Abs: 0.5 10*3/uL — ABNORMAL LOW (ref 0.7–4.0)
MONOS PCT: 5 % (ref 3–12)
Monocytes Absolute: 0.4 10*3/uL (ref 0.1–1.0)
Neutro Abs: 6.4 10*3/uL (ref 1.7–7.7)
Neutrophils Relative %: 86 % — ABNORMAL HIGH (ref 43–77)

## 2014-01-29 LAB — ETHANOL: Alcohol, Ethyl (B): <11 mg/dL (ref 0–11)

## 2014-01-29 LAB — I-STAT TROPONIN, ED: Troponin i, poc: 0 ng/mL (ref 0.00–0.08)

## 2014-01-29 LAB — APTT: aPTT: 24 seconds (ref 24–37)

## 2014-01-29 LAB — PROTIME-INR
INR: 0.99 (ref 0.00–1.49)
Prothrombin Time: 13.2 seconds (ref 11.6–15.2)

## 2014-01-29 MED ORDER — ASPIRIN EC 81 MG PO TBEC: 81.0000 mg | DELAYED_RELEASE_TABLET | Freq: Every day | ORAL | Status: DC

## 2014-01-29 MED ORDER — PANTOPRAZOLE SODIUM 40 MG IV SOLR
40.0000 mg | Freq: Once | INTRAVENOUS | Status: AC
  Administered 2014-01-30: 40 mg via INTRAVENOUS
  Filled 2014-01-29: qty 40

## 2014-01-29 MED ORDER — LEVOTHYROXINE SODIUM 50 MCG PO TABS
50.0000 ug | ORAL_TABLET | Freq: Every day | ORAL | Status: DC
  Administered 2014-01-31: 50 ug via ORAL
  Filled 2014-01-29 (×2): qty 1

## 2014-01-29 MED ORDER — DONEPEZIL HCL 10 MG PO TABS
10.0000 mg | ORAL_TABLET | Freq: Every day | ORAL | Status: DC
  Administered 2014-01-30 – 2014-01-31 (×2): 10 mg via ORAL
  Filled 2014-01-29 (×2): qty 1

## 2014-01-29 MED ORDER — SENNOSIDES-DOCUSATE SODIUM 8.6-50 MG PO TABS: 1.0000 | ORAL_TABLET | Freq: Every evening | ORAL | Status: DC | PRN

## 2014-01-29 MED ORDER — STROKE: EARLY STAGES OF RECOVERY BOOK
Freq: Once | Status: AC
  Administered 2014-01-29: 21:00:00
  Filled 2014-01-29: qty 1

## 2014-01-29 MED ORDER — ENOXAPARIN SODIUM 30 MG/0.3ML ~~LOC~~ SOLN
30.0000 mg | SUBCUTANEOUS | Status: DC
  Administered 2014-01-29 – 2014-01-31 (×3): 30 mg via SUBCUTANEOUS
  Filled 2014-01-29 (×3): qty 0.3

## 2014-01-29 MED ORDER — VENLAFAXINE HCL ER 75 MG PO CP24
75.0000 mg | ORAL_CAPSULE | Freq: Every day | ORAL | Status: DC
  Administered 2014-01-30 – 2014-01-31 (×2): 75 mg via ORAL
  Filled 2014-01-29 (×3): qty 1

## 2014-01-29 MED ORDER — PANTOPRAZOLE SODIUM 40 MG PO TBEC: 40.0000 mg | DELAYED_RELEASE_TABLET | Freq: Every day | ORAL | Status: DC

## 2014-01-29 MED ORDER — ASPIRIN 325 MG PO TABS
325.0000 mg | ORAL_TABLET | Freq: Every day | ORAL | Status: DC
  Administered 2014-01-31: 325 mg via ORAL
  Filled 2014-01-29: qty 1

## 2014-01-29 MED ORDER — ASPIRIN 300 MG RE SUPP
300.0000 mg | Freq: Every day | RECTAL | Status: DC
  Administered 2014-01-29 – 2014-01-30 (×2): 300 mg via RECTAL
  Filled 2014-01-29: qty 1

## 2014-01-29 NOTE — Progress Notes (Signed)
Report recd from Sharonaryn, ED RN. Pt currently in MRI. Will monitor for pt's arrival to unit.  Andrew AuVafiadis, Carlisa Eble I 01/29/2014 5:42 PM

## 2014-01-29 NOTE — Progress Notes (Signed)
1845 - pt unable to follow commands, unable to perform full NIH scale. Expressive aphasia noted, pt able to state where she is and name.  Will closely monitor   Andrew AuVafiadis, Namira Rosekrans I 01/29/2014 7:06 PM

## 2014-01-29 NOTE — Progress Notes (Signed)
1832 - pt arrived to unit per stretcher from MRI with transporter, Dion BodyJames Lackey. No distress noted. Will monitor.   Andrew AuVafiadis, Brandon Scarbrough I 01/29/2014 6:40 PM

## 2014-01-29 NOTE — ED Notes (Signed)
Per EMS, pt neighbor checked on her this afternoon and found pt on her knees in the floor with L sided weakness and slurred speech. LKW was last pm. Pt presents at this time with L weakness, slurred speech, and R sided gaze.

## 2014-01-29 NOTE — Consult Note (Signed)
Referring Physician: Dr. Alvino Chapel    Chief Complaint: left hemiparesis, left face weakness, right gaze preference, dysarthria.  HPI:                                                                                                                                         Sarah Daniel is an 80 y.o. female with a past medical history significant for HTN, CAD, dementia, hypothyroidism, brought in by EMS for further evaluation of the above stated symptoms. Patient is not really capable of contribute to her clinical history and thus all information is obtained from the chart. " Per EMS, pt neighbor checked on her this afternoon and found pt on her knees in the floor with L sided weakness and slurred speech. LKW was last pm. Pt presents at this time with L weakness, slurred speech, and R sided gaze". Reportedly last seen normal last PM. CT brain showed cortical hypodensity in the right frontoparietal region suspicious for acute/subacute right MCA distribution infarct. No mass effect or hemorrhagic transformation. Currently, alert and awake, follows commands.  Date last known well: 01/28/14 Time last known well: uncertain tPA Given: no, out of the window.   Past Medical History  Diagnosis Date  . Dementia   . Hypertension   . GERD (gastroesophageal reflux disease)   . Thyroid disease   . PUD (peptic ulcer disease)   . Coronary artery disease   . Hypothyroidism   . Arthritis     Past Surgical History  Procedure Laterality Date  . Abdominal hysterectomy      History reviewed. No pertinent family history. Social History:  reports that she has quit smoking. She has never used smokeless tobacco. She reports that she does not drink alcohol or use illicit drugs.  Allergies: No Known Allergies  Medications:                                                                                                                           I have reviewed the patient's current medications.  ROS: unable  to obtain due to mental status.  History obtained from chart review  Physical exam: pleasant female in no apparent distress. Blood pressure 172/66, temperature 97.7 F (36.5 C), resp. rate 15, SpO2 100.00%. Head: normocephalic. Neck: supple, no bruits, no JVD. Cardiac: no murmurs. Lungs: clear. Abdomen: soft, no tender, no mass. Extremities: no edema. Neurologic Examination:                                                                                                      General: Mental Status: Alert and awake. Comprehension intact and is able to follow 2nd steps commands. Dysarthric but without evidence of aphasia.   Cranial Nerves: II: Discs flat bilaterally; Visual fields grossly normal, pupils equal, round, reactive to light and accommodation III,IV, VI: ptosis not present, right gaze preference V,VII: smile asymmetric due to left face weakness, facial light touch sensation normal bilaterally VIII: hearing grossly normal bilaterally IX,X: gag reflex present XI: bilateral shoulder shrug no tested XII: midline tongue Motor: Dense flaccid left hemiparesis arm greater than leg Sensory: Pinprick and light touch appears to be diminished in the left side. Deep Tendon Reflexes:  1+ UE, 2 LE Plantars: Right: downgoing   Left: upgoing Cerebellar: Unable to test in the left side due to weakness. Gait:  Unable to test    Results for orders placed during the hospital encounter of 01/29/14 (from the past 48 hour(s))  ETHANOL     Status: None   Collection Time    01/29/14  3:42 PM      Result Value Ref Range   Alcohol, Ethyl (B) <11  0 - 11 mg/dL   Comment:            LOWEST DETECTABLE LIMIT FOR     SERUM ALCOHOL IS 11 mg/dL     FOR MEDICAL PURPOSES ONLY  PROTIME-INR     Status: None   Collection Time    01/29/14  3:42 PM      Result Value Ref Range   Prothrombin Time 13.2   11.6 - 15.2 seconds   INR 0.99  0.00 - 1.49  APTT     Status: None   Collection Time    01/29/14  3:42 PM      Result Value Ref Range   aPTT 24  24 - 37 seconds  CBC     Status: Abnormal   Collection Time    01/29/14  3:42 PM      Result Value Ref Range   WBC 7.4  4.0 - 10.5 K/uL   RBC 3.70 (*) 3.87 - 5.11 MIL/uL   Hemoglobin 10.8 (*) 12.0 - 15.0 g/dL   HCT 34.1 (*) 36.0 - 46.0 %   MCV 92.2  78.0 - 100.0 fL   MCH 29.2  26.0 - 34.0 pg   MCHC 31.7  30.0 - 36.0 g/dL   RDW 15.8 (*) 11.5 - 15.5 %   Platelets 301  150 - 400 K/uL  DIFFERENTIAL     Status: Abnormal   Collection Time    01/29/14  3:42 PM      Result Value Ref Range  Neutrophils Relative % 86 (*) 43 - 77 %   Neutro Abs 6.4  1.7 - 7.7 K/uL   Lymphocytes Relative 7 (*) 12 - 46 %   Lymphs Abs 0.5 (*) 0.7 - 4.0 K/uL   Monocytes Relative 5  3 - 12 %   Monocytes Absolute 0.4  0.1 - 1.0 K/uL   Eosinophils Relative 1  0 - 5 %   Eosinophils Absolute 0.0  0.0 - 0.7 K/uL   Basophils Relative 1  0 - 1 %   Basophils Absolute 0.0  0.0 - 0.1 K/uL  COMPREHENSIVE METABOLIC PANEL     Status: Abnormal   Collection Time    01/29/14  3:42 PM      Result Value Ref Range   Sodium 138  137 - 147 mEq/L   Potassium 4.3  3.7 - 5.3 mEq/L   Chloride 101  96 - 112 mEq/L   CO2 24  19 - 32 mEq/L   Glucose, Bld 154 (*) 70 - 99 mg/dL   BUN 23  6 - 23 mg/dL   Creatinine, Ser 0.90  0.50 - 1.10 mg/dL   Calcium 9.4  8.4 - 10.5 mg/dL   Total Protein 7.0  6.0 - 8.3 g/dL   Albumin 3.6  3.5 - 5.2 g/dL   AST 26  0 - 37 U/L   ALT 29  0 - 35 U/L   Alkaline Phosphatase 83  39 - 117 U/L   Total Bilirubin 0.3  0.3 - 1.2 mg/dL   GFR calc non Af Amer 55 (*) >90 mL/min   GFR calc Af Amer 63 (*) >90 mL/min   Comment: (NOTE)     The eGFR has been calculated using the CKD EPI equation.     This calculation has not been validated in all clinical situations.     eGFR's persistently <90 mL/min signify possible Chronic Kidney     Disease.   Anion gap 13  5  - 15  I-STAT TROPOININ, ED     Status: None   Collection Time    01/29/14  3:53 PM      Result Value Ref Range   Troponin i, poc 0.00  0.00 - 0.08 ng/mL   Comment 3            Comment: Due to the release kinetics of cTnI,     a negative result within the first hours     of the onset of symptoms does not rule out     myocardial infarction with certainty.     If myocardial infarction is still suspected,     repeat the test at appropriate intervals.  I-STAT CHEM 8, ED     Status: Abnormal   Collection Time    01/29/14  3:55 PM      Result Value Ref Range   Sodium 139  137 - 147 mEq/L   Potassium 4.1  3.7 - 5.3 mEq/L   Chloride 104  96 - 112 mEq/L   BUN 23  6 - 23 mg/dL   Creatinine, Ser 0.90  0.50 - 1.10 mg/dL   Glucose, Bld 152 (*) 70 - 99 mg/dL   Calcium, Ion 1.18  1.13 - 1.30 mmol/L   TCO2 24  0 - 100 mmol/L   Hemoglobin 12.9  12.0 - 15.0 g/dL   HCT 38.0  36.0 - 46.0 %   No results found.   Assessment: 80 y.o. female with a right MCA distribution syndrome and CT brain  disclosing cortical hypodensity in the same territory. Stroke mechanism likely embolic or resulting from focal right MCA atherosclerotic disease. Unfortunately, she is out of the window for IV thrombolysis or endovascular intervention. Admit to medicine and complete stroke work up. Aspirin after she passes swallowing evaluation. Stroke team will follow up in the morning.  Stroke Risk Factors - age,  HTN, CAD,  Plan: 1. HgbA1c, fasting lipid panel 2. MRI, MRA  of the brain without contrast 3. Echocardiogram 4. Carotid dopplers 5. Prophylactic therapy-aspirin 6. Risk factor modification 7. Telemetry monitoring 8. Frequent neuro checks 9. PT/OT SLP   Dorian Pod, MD [facility] [phone]  01/29/2014, 4:50 PM

## 2014-01-29 NOTE — ED Notes (Signed)
Patient transported to CT 

## 2014-01-29 NOTE — H&P (Signed)
[facility] History and Physical  Sarah Daniel ZOX:[id number] DOB: [birth year] DOA: 01/29/2014  Referring physician: EDP PCP: Sarah MountainGRIFFIN,Sarah Sarah Langhorst, Daniel   Chief Complaint: found down  HPI: Sarah Daniel is a 80 y.o. female with PMH of CAD, HTN, Dementia, hypothyroidism was brought to the ER by EMS today after her daughter found her down this morning beside her bed, when she had a facial droop and L hemiparesis. Patient is a very poor historian. Last seen normal yesterday, called EMS [facility] she was noted to have L hemiparesis, L neglect and facial droop. CT with R MCA stroke, Neurology and [facility] consulted    Review of Systems: unable to obtain due to dementia Constitutional:  No weight loss, night sweats, Fevers, chills, fatigue.  HEENT:  No headaches, Difficulty swallowing,Tooth/dental problems,Sore throat,  No sneezing, itching, ear ache, nasal congestion, post nasal drip,  Cardio-vascular:  No chest pain, Orthopnea, PND, swelling in lower extremities, anasarca, dizziness, palpitations  GI:  No heartburn, indigestion, abdominal pain, nausea, vomiting, diarrhea, change in bowel habits, loss of appetite  Resp:  No shortness of breath with exertion or at rest. No excess mucus, no productive cough, No non-productive cough, No coughing up of blood.No change in color of mucus.No wheezing.No chest wall deformity  Skin:  no rash or lesions.  GU:  no dysuria, change in color of urine, no urgency or frequency. No flank pain.  Musculoskeletal:  No joint pain or swelling. No decreased range of motion. No back pain.  Psych:  No change in mood or affect. No depression or anxiety. No memory loss.   Past Medical History  Diagnosis Date  . Dementia   . Hypertension   . GERD (gastroesophageal reflux disease)   . Thyroid disease   . PUD (peptic ulcer disease)   . Coronary artery disease   . Hypothyroidism   . Arthritis    Past Surgical History  Procedure Laterality  Date  . Abdominal hysterectomy     Social History:  reports that she has quit smoking. She has never used smokeless tobacco. She reports that she does not drink alcohol or use illicit drugs.  No Known Allergies  History reviewed. No pertinent family history.   Prior to Admission medications   Medication Sig Start Date End Date Taking? Authorizing Provider  aspirin EC 81 MG tablet Take 81 mg by mouth daily.    Historical Provider, Daniel  atorvastatin (LIPITOR) 40 MG tablet Take 40 mg by mouth daily.    Historical Provider, Daniel  cholecalciferol (VITAMIN D) 1000 UNITS tablet Take 1,000 Units by mouth daily.    Historical Provider, Daniel  clopidogrel (PLAVIX) 75 MG tablet Take 75 mg by mouth daily with breakfast.    Historical Provider, Daniel  donepezil (ARICEPT) 10 MG tablet  11/15/13   Historical Provider, Daniel  levothyroxine (SYNTHROID, LEVOTHROID) 50 MCG tablet Take 50 mcg by mouth daily before breakfast.    Historical Provider, Daniel  pantoprazole (PROTONIX) 40 MG tablet Take 1 tablet (40 mg total) by mouth daily. 03/10/13   Sarah MountainJohn Shamon Lobo Griffin, Daniel  venlafaxine XR (EFFEXOR-XR) 75 MG 24 hr capsule Take 75 mg by mouth daily with breakfast.    Historical Provider, Daniel   Physical Exam: Filed Vitals:   01/29/14 1534 01/29/14 1536 01/29/14 1637 01/29/14 1638  BP:   172/66   Temp:  97.7 F (36.5 C)    Resp: 19   15  SpO2:    100%    Wt Readings from  Last 3 Encounters:  03/09/13 45.7 kg (100 lb 12 oz)    General:  Appears calm and comfortable, L neglect, oriented to self and place only Eyes: PERRL, normal lids, irises & conjunctiva ENT: grossly normal lips & tongue Neck: no LAD, masses or thyromegaly Cardiovascular: RRR, no m/r/g. No LE edema. Telemetry: SR, no arrhythmias  Respiratory: CTA bilaterally, no w/r/r. Normal respiratory effort. Abdomen: soft, ntnd Skin: no rash or induration seen on limited exam Musculoskeletal: grossly normal tone BUE/BLE Psychiatric: grossly normal mood and affect,  speech fluent and appropriate Neurologic: L hemi neglect, Dense L hemiparesis, and facial droop, plantars upgoing on L          Labs on Admission:  Basic Metabolic Panel:  Recent Labs Lab 01/29/14 1542 01/29/14 1555  NA 138 139  K 4.3 4.1  CL 101 104  CO2 24  --   GLUCOSE 154* 152*  BUN 23 23  CREATININE 0.90 0.90  CALCIUM 9.4  --    Liver Function Tests:  Recent Labs Lab 01/29/14 1542  AST 26  ALT 29  ALKPHOS 83  BILITOT 0.3  PROT 7.0  ALBUMIN 3.6   No results found for this basename: LIPASE, AMYLASE,  in the last 168 hours No results found for this basename: AMMONIA,  in the last 168 hours CBC:  Recent Labs Lab 01/29/14 1542 01/29/14 1555  WBC 7.4  --   NEUTROABS 6.4  --   HGB 10.8* 12.9  HCT 34.1* 38.0  MCV 92.2  --   PLT 301  --    Cardiac Enzymes: No results found for this basename: CKTOTAL, CKMB, CKMBINDEX, TROPONINI,  in the last 168 hours  BNP (last 3 results) No results found for this basename: PROBNP,  in the last 8760 hours CBG: No results found for this basename: GLUCAP,  in the last 168 hours  Radiological Exams on Admission: Dg Chest 1 View  01/29/2014   CLINICAL DATA:  Left-sided weakness, slurred speech. History of hypertension. Found on floor.  EXAM: CHEST - 1 VIEW  COMPARISON:  03/08/2013  FINDINGS: Heart is normal size. No confluent airspace opacities or effusions. Calcifications in the aortic arch. No visible aneurysm. No acute bony abnormality.  IMPRESSION: No active disease.   Electronically Signed   By: Charlett Nose M.D.   On: 01/29/2014 17:03   Ct Head Wo Contrast  01/29/2014   CLINICAL DATA:  Stroke symptoms, left-sided weakness, slurred speech.  EXAM: CT HEAD WITHOUT CONTRAST  TECHNIQUE: Contiguous axial images were obtained from the base of the skull through the vertex without intravenous contrast.  COMPARISON:  None.  FINDINGS: Cortical hypodensity in the right frontoparietal region (series 201/ image 16), suspicious for  acute/subacute right MCA distribution infarct.  No evidence of parenchymal hemorrhage or extra-axial fluid collection.  No mass lesion, mass effect, or midline shift.  Subcortical white matter and periventricular small vessel ischemic changes. Intracranial atherosclerosis.  Global cortical atrophy.  No ventriculomegaly.  The visualized paranasal sinuses are essentially clear. The mastoid air cells are unopacified.  No evidence of calvarial fracture.  IMPRESSION: Suspected acute/subacute right MCA distribution infarct.  These results were called by telephone at the time of interpretation on 01/29/2014 at 4:49 pm to Dr. Lynelle Doctor, who verbally acknowledged these results.   Electronically Signed   By: Charline Bills M.D.   On: 01/29/2014 16:49    EKG: Independently reviewed. NSR, no acute ST t wave changes  Assessment/Plan    1. L hemiparesis/L hemi neglect  and facial droop -large R MCA stroke on CT -check MRI/MRA -check ECHO/carotid duplex -LDL, hbaic -Pt/OT/ST eval -ASA 300mg  PR daily since failed ER swallow screen, on ASA 81 and plavix 75mg  at home prior to admission  2. Dementia -mild per daughter, resume aricept tomorrow pending speech eval  3. Coronary artery disease -with h/o PCI and stents in 2013 per [place] in [place] -hold anti-platelet agents pending swallow screen and neuro recs  4. Hypothyroidism -resume synthroid tomorrow  Code Status: DNR DVT Prophylaxis: lovenox Family Communication: called and d/w daughter Geraldine ContrasDee Disposition Plan: inpatient  Time spent: 60min  [facility] HospitalJOSEPH,Aleesa Sweigert [facility] Pager [phone]

## 2014-01-29 NOTE — ED Notes (Signed)
Pt to MRI at this time. Will be transported to floor from MRI.

## 2014-01-29 NOTE — ED Provider Notes (Signed)
CSN: [id number]     Arrival date & time 01/29/14  1527 History   First MD Initiated Contact with Patient 01/29/14 1535     Chief Complaint  Patient presents with  . Stroke Symptoms    Level V caveat due to altered mental status (Consider location/radiation/quality/duration/timing/severity/associated sxs/prior Treatment) The history is provided by the patient.  patient presents with a likely stroke. Reportedly found this morning. Not in her left side. Left facial droop. Minimal speech.  Past Medical History  Diagnosis Date  . Dementia   . Hypertension   . GERD (gastroesophageal reflux disease)   . Thyroid disease   . PUD (peptic ulcer disease)   . Coronary artery disease   . Hypothyroidism   . Arthritis    Past Surgical History  Procedure Laterality Date  . Abdominal hysterectomy     History reviewed. No pertinent family history. History  Substance Use Topics  . Smoking status: Former Games developermoker  . Smokeless tobacco: Never Used     Comment: QUIT MANY YEARS AGO "  . Alcohol Use: No   OB History   Grav Para Term Preterm Abortions TAB SAB Ect Mult Living                 Review of Systems  Unable to perform ROS     Allergies  Review of patient's allergies indicates no known allergies.  Home Medications   Prior to Admission medications   Medication Sig Start Date End Date Taking? Authorizing Provider  aspirin EC 81 MG tablet Take 81 mg by mouth daily.    Historical Provider, MD  atorvastatin (LIPITOR) 40 MG tablet Take 40 mg by mouth daily.    Historical Provider, MD  cholecalciferol (VITAMIN D) 1000 UNITS tablet Take 1,000 Units by mouth daily.    Historical Provider, MD  clopidogrel (PLAVIX) 75 MG tablet Take 75 mg by mouth daily with breakfast.    Historical Provider, MD  levothyroxine (SYNTHROID, LEVOTHROID) 50 MCG tablet Take 50 mcg by mouth daily before breakfast.    Historical Provider, MD  pantoprazole (PROTONIX) 40 MG tablet Take 1 tablet (40 mg total) by  mouth daily. 03/10/13   Lillia MountainJohn Joseph Griffin, MD  venlafaxine XR (EFFEXOR-XR) 75 MG 24 hr capsule Take 75 mg by mouth daily with breakfast.    Historical Provider, MD   BP 174/77  Temp(Src) 97.7 F (36.5 C)  Resp 19  SpO2 99% Physical Exam  Constitutional: She appears well-developed.  HENT:  Head: Normocephalic.  Eyes:  Pupils are somewhat constricted. Patient will look to the right but will not cross midline to the left.  Neck: Neck supple.  Cardiovascular: Normal rate and regular rhythm.   Pulmonary/Chest: Effort normal.  Abdominal: Soft.  Neurological:  Patient is minimally verbal. She will squeeze hand on right to commands. She has left-sided neglect. Left-sided facial droop. There is some beats of clonus on the right foot.  Skin: Skin is warm.    ED Course  Procedures (including critical care time) Labs Review Labs Reviewed  CBC - Abnormal; Notable for the following:    RBC 3.70 (*)    Hemoglobin 10.8 (*)    HCT 34.1 (*)    RDW 15.8 (*)    All other components within normal limits  DIFFERENTIAL - Abnormal; Notable for the following:    Neutrophils Relative % 86 (*)    Lymphocytes Relative 7 (*)    Lymphs Abs 0.5 (*)    All other components within normal limits  I-STAT CHEM 8, ED - Abnormal; Notable for the following:    Glucose, Bld 152 (*)    All other components within normal limits  PROTIME-INR  APTT  ETHANOL  COMPREHENSIVE METABOLIC PANEL  URINE RAPID DRUG SCREEN (HOSP PERFORMED)  URINALYSIS, ROUTINE W REFLEX MICROSCOPIC  I-STAT TROPOININ, ED    Imaging Review No results found.   EKG Interpretation   Date/Time:  Saturday January 29 2014 15:34:53 EDT Ventricular Rate:  86 PR Interval:  175 QRS Duration: 103 QT Interval:  374 QTC Calculation: 447 R Axis:   68 Text Interpretation:  Sinus rhythm Baseline wander in lead(s) V4 T waves  more prominant anteriorly Confirmed by Caren Garske  MD, Aubrina Nieman [phone] on  01/29/2014 3:59:41 PM      MDM   Final  diagnoses:  Weakness  Stroke    Patient presents with left-sided weakness and neglect. Last normal was yesterday. Not a TPA candidate due to time of onset. Will be admitted by internal medicine and neurology has been consulted.    Juliet RudeNathan R. Rubin PayorPickering, MD 01/29/14 [phone]

## 2014-01-29 NOTE — ED Notes (Addendum)
Spoke with pt daughter on phone. Daughter states pt lives in house with her, usually alert and oriented and safe at home with minimal assistance. Daughter updated on pt status by this RN and admitting physician.

## 2014-01-30 ENCOUNTER — Encounter (HOSPITAL_COMMUNITY): Payer: Self-pay | Admitting: Radiology

## 2014-01-30 ENCOUNTER — Inpatient Hospital Stay (HOSPITAL_COMMUNITY): Payer: Medicare Other

## 2014-01-30 DIAGNOSIS — I059 Rheumatic mitral valve disease, unspecified: Secondary | ICD-10-CM

## 2014-01-30 DIAGNOSIS — E079 Disorder of thyroid, unspecified: Secondary | ICD-10-CM

## 2014-01-30 LAB — LIPID PANEL
CHOL/HDL RATIO: 1.8 ratio
CHOLESTEROL: 183 mg/dL (ref 0–200)
HDL: 102 mg/dL (ref >39)
LDL Cholesterol: 61 mg/dL (ref 0–99)
TRIGLYCERIDES: 101 mg/dL (ref <150)
VLDL: 20 mg/dL (ref 0–40)

## 2014-01-30 LAB — HEMOGLOBIN A1C
Hgb A1c MFr Bld: 5.3 % (ref <5.7)
MEAN PLASMA GLUCOSE: 105 mg/dL (ref <117)

## 2014-01-30 MED ORDER — ACETAMINOPHEN 650 MG RE SUPP: 650.0000 mg | Freq: Four times a day (QID) | RECTAL | Status: DC | PRN

## 2014-01-30 MED ORDER — CHLORHEXIDINE GLUCONATE 0.12 % MT SOLN
15.0000 mL | Freq: Two times a day (BID) | OROMUCOSAL | Status: DC
  Administered 2014-01-30 – 2014-01-31 (×3): 15 mL via OROMUCOSAL
  Filled 2014-01-30 (×4): qty 15

## 2014-01-30 MED ORDER — SODIUM CHLORIDE 0.9 % IV SOLN
INTRAVENOUS | Status: DC
  Administered 2014-01-30 – 2014-01-31 (×3): via INTRAVENOUS

## 2014-01-30 MED ORDER — TRAMADOL HCL 50 MG PO TABS
50.0000 mg | ORAL_TABLET | Freq: Four times a day (QID) | ORAL | Status: DC | PRN
  Administered 2014-01-30 – 2014-02-01 (×3): 50 mg via ORAL
  Filled 2014-01-30 (×3): qty 1

## 2014-01-30 MED ORDER — IOHEXOL 350 MG/ML SOLN
50.0000 mL | Freq: Once | INTRAVENOUS | Status: AC | PRN
  Administered 2014-01-30: 50 mL via INTRAVENOUS

## 2014-01-30 MED ORDER — KETOROLAC TROMETHAMINE 15 MG/ML IJ SOLN
7.5000 mg | Freq: Once | INTRAMUSCULAR | Status: AC
  Administered 2014-01-30: 7.5 mg via INTRAVENOUS
  Filled 2014-01-30: qty 1

## 2014-01-30 MED ORDER — CETYLPYRIDINIUM CHLORIDE 0.05 % MT LIQD
7.0000 mL | Freq: Two times a day (BID) | OROMUCOSAL | Status: DC
  Administered 2014-01-30 – 2014-01-31 (×4): 7 mL via OROMUCOSAL

## 2014-01-30 MED ORDER — MORPHINE SULFATE 2 MG/ML IJ SOLN
1.0000 mg | INTRAMUSCULAR | Status: DC | PRN
Start: 2014-01-30 — End: 2014-02-01

## 2014-01-30 NOTE — Progress Notes (Signed)
Patient ID: Sarah Daniel  female  ZOX:[id number]    DOB: 07/25/1921    DOA: 01/29/2014  PCP: Lillia Mountain, MD  Brief history of present illness  Sarah Daniel is a 80 y.o. female with PMH of CAD, HTN, Dementia, hypothyroidism was brought to the ER by EMS today after her daughter found her down this morning beside her bed, when she had a facial droop and L hemiparesis.  Patient is a very poor historian.Last seen normal on 10/23, in ED noted to have L hemiparesis, L neglect and facial droop. CT with R MCA stroke  Assessment/Plan: Principal Problem: 1. L hemiparesis/L hemi neglect and facial droop /acute CVA CT head showed acute/subacute right MCA distribution infarct in the right frontoparietal region - MRI confirmed acute nonhemorrhagic anterior right MCA territory infarct, remote posterior right MCA territory infarct - MRA showed occlusion of right internal carotid artery with partial reconstitution in the cavernous and ophthalmic segments, diminished flow intensity in the right M1 and A1 segments, mild to distal small vessel disease and left MCA branches - ECHO/carotid duplex pending -Lipid panel showed LDL of 61, hbAic pending -Pt/OT/ST eval pending -ASA 300mg  PR daily since failed ER swallow screen, on ASA 81 and plavix 75mg  at home prior to admission   Active problems:  2. Dementia  -mild per daughter, resume aricept pending speech eval   3. Coronary artery disease  -with h/o PCI and stents in 2013 per Dtr in [place]  -hold anti-platelet agents pending swallow screen and neuro recs   4. Hypothyroidism  -resume synthroid when he passes swallow evaluation  DVT Prophylaxis: DO NOT RESUSCITATE  Code Status: Lovenox  Family Communication:  Disposition:  Consultants:  Neurology  Procedures:  CT head, MRI head, MRA  Antibiotics:  None    Subjective: Patient seen and examined, oriented to self left-sided neglect   Objective: Weight change:    Intake/Output Summary (Last 24 hours) at 01/30/14 0928 Last data filed at 01/30/14 0200  Gross per 24 hour  Intake      0 ml  Output      1 ml  Net     -1 ml   Blood pressure 144/76, pulse 85, temperature 98 F (36.7 C), temperature source Oral, resp. rate 18, weight 42.185 kg (93 lb), SpO2 98.00%.  Physical Exam: General: Alert and awake, oriented to self CVS: S1-S2 clear, no murmur rubs or gallops Chest: clear to auscultation bilaterally anteriorly Abdomen: soft nontender, nondistended, normal bowel sounds  Extremities: no cyanosis, clubbing or edema  Neuro: Left-sided hemiparesis  Lab Results: Basic Metabolic Panel:  Recent Labs Lab 01/29/14 1542 01/29/14 1555  NA 138 139  K 4.3 4.1  CL 101 104  CO2 24  --   GLUCOSE 154* 152*  BUN 23 23  CREATININE 0.90 0.90  CALCIUM 9.4  --    Liver Function Tests:  Recent Labs Lab 01/29/14 1542  AST 26  ALT 29  ALKPHOS 83  BILITOT 0.3  PROT 7.0  ALBUMIN 3.6   No results found for this basename: LIPASE, AMYLASE,  in the last 168 hours No results found for this basename: AMMONIA,  in the last 168 hours CBC:  Recent Labs Lab 01/29/14 1542 01/29/14 1555  WBC 7.4  --   NEUTROABS 6.4  --   HGB 10.8* 12.9  HCT 34.1* 38.0  MCV 92.2  --   PLT 301  --    Cardiac Enzymes: No results found for this basename: CKTOTAL, CKMB, CKMBINDEX, TROPONINI,  in the last 168 hours BNP: No components found with this basename: POCBNP,  CBG: No results found for this basename: GLUCAP,  in the last 168 hours   Micro Results: No results found for this or any previous visit (from the past 240 hour(s)).  Studies/Results: Dg Chest 1 View  01/29/2014   CLINICAL DATA:  Left-sided weakness, slurred speech. History of hypertension. Found on floor.  EXAM: CHEST - 1 VIEW  COMPARISON:  03/08/2013  FINDINGS: Heart is normal size. No confluent airspace opacities or effusions. Calcifications in the aortic arch. No visible aneurysm. No acute  bony abnormality.  IMPRESSION: No active disease.   Electronically Signed   By: Charlett NoseKevin  Dover M.D.   On: 01/29/2014 17:03   Ct Head Wo Contrast  01/29/2014   CLINICAL DATA:  Stroke symptoms, left-sided weakness, slurred speech.  EXAM: CT HEAD WITHOUT CONTRAST  TECHNIQUE: Contiguous axial images were obtained from the base of the skull through the vertex without intravenous contrast.  COMPARISON:  None.  FINDINGS: Cortical hypodensity in the right frontoparietal region (series 201/ image 16), suspicious for acute/subacute right MCA distribution infarct.  No evidence of parenchymal hemorrhage or extra-axial fluid collection.  No mass lesion, mass effect, or midline shift.  Subcortical white matter and periventricular small vessel ischemic changes. Intracranial atherosclerosis.  Global cortical atrophy.  No ventriculomegaly.  The visualized paranasal sinuses are essentially clear. The mastoid air cells are unopacified.  No evidence of calvarial fracture.  IMPRESSION: Suspected acute/subacute right MCA distribution infarct.  These results were called by telephone at the time of interpretation on 01/29/2014 at 4:49 pm to Dr. Lynelle DoctorKnapp, who verbally acknowledged these results.   Electronically Signed   By: Charline BillsSriyesh  Krishnan M.D.   On: 01/29/2014 16:49   Mr Maxine GlennMra Head Wo Contrast  01/29/2014   CLINICAL DATA:  Left-sided weakness, slurred speech, and rightward gaze. Headaches. Stroke.  EXAM: MRI HEAD WITHOUT CONTRAST  MRA HEAD WITHOUT CONTRAST  TECHNIQUE: Multiplanar, multiecho pulse sequences of the brain and surrounding structures were obtained without intravenous contrast. Angiographic images of the head were obtained using MRA technique without contrast.  COMPARISON:  CT head from the same day.  FINDINGS: MRI HEAD FINDINGS  The diffusion-weighted images confirm an acute nonhemorrhagic anterior right MCA territory infarct involving the right frontal operculum and insular cortex. There is a focus of restricted diffusion  within the lateral right temporal lobe as well a more remote posterior right temporal and parietal infarct is again noted. No acute hemorrhage or mass lesion is present.  Advanced atrophy and diffuse white matter changes are otherwise stable. There dilated perivascular spaces and remote lacunar infarcts within the basal ganglia bilaterally.  Flow is present in the left internal carotid artery in the vertebrobasilar system. There is no flow within the right carotid artery. Abnormal signal suggests attenuated or occluded flow within the right middle cerebral artery as well.  Patient is status post bilateral lens extractions. The globes and orbits are otherwise intact. Paranasal sinuses are clear. There is some fluid in left mastoid air cells. No obstructing at nasopharyngeal lesion is evident.  MRA HEAD FINDINGS  The right internal carotid artery is occluded. There is partial reconstitution at the level of the ophthalmic artery with some flow seen into the anterior genu of the cavernous carotid artery as well. There is markedly attenuated flow within the right M1 and A1 segments. No MCA territory branch vessels are seen. There is markedly attenuated signal within the right A2 segments. No definite  anterior communicating artery is evident. The left A1 and M1 segments are normal. The left MCA bifurcation is within normal limits. There is some attenuation of distal left MCA branch vessels. The left ACA branch vessels are normal.  The right vertebral artery is the dominant vessel. The PICA origins are visualized and normal bilaterally. The basilar artery is within normal limits. Both posterior cerebral arteries originate from the basilar tip. Asymmetric attenuation of PCA branch vessels is worse on the left.  IMPRESSION: 1. Acute nonhemorrhagic anterior right MCA territory infarct. 2. More remote posterior right MCA territory infarcts. 3. Occlusion of the right internal carotid artery with partial reconstitution in the  cavernous and ophthalmic segments. 4. Diminished flow intensity in the right M1 and A1 segments with no flow signal seen beyond the right MCA bifurcation. 5. Mild distal small vessel disease in the left MCA branch vessels and left greater than right PCA branch vessels. These results were called by telephone at the time of interpretation on 01/29/2014 at 7:02 pm to Dr. Wyatt Portela , who verbally acknowledged these results.   Electronically Signed   By: Gennette Pac M.D.   On: 01/29/2014 19:02   Mr Brain Wo Contrast  01/29/2014   CLINICAL DATA:  Left-sided weakness, slurred speech, and rightward gaze. Headaches. Stroke.  EXAM: MRI HEAD WITHOUT CONTRAST  MRA HEAD WITHOUT CONTRAST  TECHNIQUE: Multiplanar, multiecho pulse sequences of the brain and surrounding structures were obtained without intravenous contrast. Angiographic images of the head were obtained using MRA technique without contrast.  COMPARISON:  CT head from the same day.  FINDINGS: MRI HEAD FINDINGS  The diffusion-weighted images confirm an acute nonhemorrhagic anterior right MCA territory infarct involving the right frontal operculum and insular cortex. There is a focus of restricted diffusion within the lateral right temporal lobe as well a more remote posterior right temporal and parietal infarct is again noted. No acute hemorrhage or mass lesion is present.  Advanced atrophy and diffuse white matter changes are otherwise stable. There dilated perivascular spaces and remote lacunar infarcts within the basal ganglia bilaterally.  Flow is present in the left internal carotid artery in the vertebrobasilar system. There is no flow within the right carotid artery. Abnormal signal suggests attenuated or occluded flow within the right middle cerebral artery as well.  Patient is status post bilateral lens extractions. The globes and orbits are otherwise intact. Paranasal sinuses are clear. There is some fluid in left mastoid air cells. No obstructing  at nasopharyngeal lesion is evident.  MRA HEAD FINDINGS  The right internal carotid artery is occluded. There is partial reconstitution at the level of the ophthalmic artery with some flow seen into the anterior genu of the cavernous carotid artery as well. There is markedly attenuated flow within the right M1 and A1 segments. No MCA territory branch vessels are seen. There is markedly attenuated signal within the right A2 segments. No definite anterior communicating artery is evident. The left A1 and M1 segments are normal. The left MCA bifurcation is within normal limits. There is some attenuation of distal left MCA branch vessels. The left ACA branch vessels are normal.  The right vertebral artery is the dominant vessel. The PICA origins are visualized and normal bilaterally. The basilar artery is within normal limits. Both posterior cerebral arteries originate from the basilar tip. Asymmetric attenuation of PCA branch vessels is worse on the left.  IMPRESSION: 1. Acute nonhemorrhagic anterior right MCA territory infarct. 2. More remote posterior right MCA territory infarcts.  3. Occlusion of the right internal carotid artery with partial reconstitution in the cavernous and ophthalmic segments. 4. Diminished flow intensity in the right M1 and A1 segments with no flow signal seen beyond the right MCA bifurcation. 5. Mild distal small vessel disease in the left MCA branch vessels and left greater than right PCA branch vessels. These results were called by telephone at the time of interpretation on 01/29/2014 at 7:02 pm to Dr. Wyatt PortelaSVALDO CAMILO , who verbally acknowledged these results.   Electronically Signed   By: Gennette Pachris  Mattern M.D.   On: 01/29/2014 19:02    Medications: Scheduled Meds: . antiseptic oral rinse  7 mL Mouth Rinse q12n4p  . aspirin  300 mg Rectal Daily   Or  . aspirin  325 mg Oral Daily  . chlorhexidine  15 mL Mouth Rinse BID  . donepezil  10 mg Oral QHS  . enoxaparin (LOVENOX) injection  30  mg Subcutaneous Q24H  . levothyroxine  50 mcg Oral QAC breakfast  . pantoprazole  40 mg Oral Daily  . venlafaxine XR  75 mg Oral Q breakfast      LOS: 1 day   RAI,RIPUDEEP M.D. [facility] 01/30/2014, 9:28 AM Pager: [phone]  If 7PM-7AM, please contact night-coverage [email or web address] Password [email or web address]

## 2014-01-30 NOTE — Evaluation (Addendum)
Physical Therapy Evaluation Patient Details Name: Sarah Daniel MRN: [id number] DOB: 23-Apr-1921 Today's Date: 01/30/2014   History of Present Illness  Adm 01/29/14 after found down at home with Lt sided weakness. + Rt frontoparietal CVA PMHx- HTN, dementia, CAD  Clinical Impression  Pt admitted with Rt frontoparietal CVA. Pt currently with functional limitations due to the deficits listed below (see PT Problem List). Pt very active prior to admission and participated fully with all requests this date (session 43 minutes). Feel pt would make an excellent inpatient rehab candidate. Pt will benefit from skilled PT to increase their independence and safety with mobility to allow discharge to the venue listed below.       Follow Up Recommendations CIR;Supervision/Assistance - 24 hour    Equipment Recommendations   (TBA)    Recommendations for Other Services Rehab consult;OT consult;Speech consult     Precautions / Restrictions Precautions Precautions: Fall Restrictions Other Position/Activity Restrictions: monitor LUE due to weakness and inattention      Mobility  Bed Mobility Overal bed mobility: Needs Assistance Bed Mobility: Rolling;Sidelying to Sit;Sit to Sidelying Rolling: Mod assist Sidelying to sit: Mod assist     Sit to sidelying: Mod assist General bed mobility comments: able to follow commands to reach for LUE to bring across her body prior to rolling to Rt (required assist to obtain/find it); rt side to sit  Transfers Overall transfer level: Needs assistance Equipment used: 1 person hand held assist Transfers: Sit to/from Stand Sit to Stand: Mod assist         General transfer comment: pt with lean to her Left; good weightbearing through LLE  Ambulation/Gait Ambulation/Gait assistance: Max assist Ambulation Distance (Feet): 6 Feet Assistive device: 1 person hand held assist Gait Pattern/deviations: Step-to pattern;Decreased stride length;Decreased  dorsiflexion - left;Decreased weight shift to right;Shuffle   Gait velocity interpretation: <1.8 ft/sec, indicative of risk for recurrent falls General Gait Details: bearing weight on LLE without buckling, however required assist to advance and place LLE; assist at pelvis and ribs for weightshifting/balance; as approached chair, IV team arrived to place IV for pt to go down for CT angiogram and returned pt to bed  Stairs            Wheelchair Mobility    Modified Rankin (Stroke Patients Only) Modified Rankin (Stroke Patients Only) Pre-Morbid Rankin Score: No symptoms Modified Rankin: Moderately severe disability     Balance Overall balance assessment: Needs assistance Sitting-balance support: No upper extremity supported;Feet supported Sitting balance-Leahy Scale: Poor Sitting balance - Comments: Lt lean, could correct to midline without assist at beginning of session; by the end, was fatigued and could initiate shift to midline and required assist to achieve   Standing balance support: No upper extremity supported;During functional activity Standing balance-Leahy Scale: Poor                               Pertinent Vitals/Pain Pain Assessment: No/denies pain    Home Living Family/patient expects to be discharged to:: Private residence Living Arrangements: Children (daughter) Available Help at Discharge: Family;Available PRN/intermittently (ACES 3 days/week 10-2 (rides [facility] bus)) Type of Home: House Home Access: Level entry     Home Layout: One level;Able to live on main level with bedroom/bathroom Home Equipment: Gilmer Morane - single point Additional Comments: walk-in shower    Prior Function Level of Independence: Independent         Comments: pt. overall very independent, at home  up to 4 hours without supervision     Hand Dominance   Dominant Hand: Right    Extremity/Trunk Assessment   Upper Extremity Assessment: Defer to OT evaluation (+associated  reactions with yawn and sneeze)           Lower Extremity Assessment: LLE deficits/detail   LLE Deficits / Details: AAROM WFL; able to initiate hip flexion, knee extension, and dorsiflexion against gravity; maintained weightbearing in standing/stepping  Cervical / Trunk Assessment: Other exceptions  Communication   Communication: Expressive difficulties (following all simple commands; correct yes/no)  Cognition Arousal/Alertness: Lethargic Behavior During Therapy: Restless Overall Cognitive Status: Difficult to assess                      General Comments General comments (skin integrity, edema, etc.): Pt appears to have Lt visual field cut with Rt gaze preference. Would only track to midline, however would turn head to her left to "keep her eyes" on therapist as walking from Rt side of bed to her Lt. Pt able to reach across midline with RUE to try to grasp LUE during tasks. When handed towel to wipe her mouth, she wiped both lt and rt sides. Daughter present throughout. Lengthy discussion re: post-acute therapy options    Exercises        Assessment/Plan    PT Assessment Patient needs continued PT services  PT Diagnosis Hemiplegia non-dominant side   PT Problem List Decreased strength;Decreased activity tolerance;Decreased balance;Decreased mobility;Decreased cognition;Decreased knowledge of use of DME;Decreased safety awareness;Decreased knowledge of precautions  PT Treatment Interventions DME instruction;Gait training;Functional mobility training;Therapeutic activities;Balance training;Neuromuscular re-education;Cognitive remediation;Patient/family education   PT Goals (Current goals can be found in the Care Plan section) Acute Rehab PT Goals Patient Stated Goal: unable to state; agrees she wants to walk PT Goal Formulation: With patient/family Time For Goal Achievement: 02/06/14 Potential to Achieve Goals: Good    Frequency Min 4X/week   Barriers to discharge  Decreased caregiver support      Co-evaluation               End of Session Equipment Utilized During Treatment: Gait belt Activity Tolerance: Patient limited by fatigue Patient left: in bed;with nursing/sitter in room Nurse Communication: Mobility status ([facility])         Time: [id number] PT Time Calculation (min): 33 min   Charges:   PT Evaluation $Initial PT Evaluation Tier I: 1 Procedure PT Treatments $Gait Training: 8-22 mins $Therapeutic Activity: 8-22 mins   PT G Codes:          Maybree Riling 01/30/2014, 12:36 PM Pager [phone]

## 2014-01-30 NOTE — Progress Notes (Signed)
Notified Md, Dr. Isidoro Donningai, via text,  that pt has headache and that no prn orders for pain in place.  Will monitor for return call. Pt currently calm, no acute distress noted, no restlessness noted.   Will continue to monitor closely.  Andrew AuVafiadis, Arlan Birks I 01/30/2014 4:30 PM

## 2014-01-30 NOTE — Evaluation (Signed)
Occupational Therapy Evaluation Patient Details Name: Sarah Daniel MRN: [id number] DOB: [birth year] Today's Date: 01/30/2014    History of Present Illness Adm 01/29/14 after found down at home with Lt sided weakness. + Rt frontoparietal CVA PMHx- HTN, dementia, CAD   Clinical Impression   Pt s/p above. Pt independent with ADLs, PTA. Feel pt will benefit from acute OT to increase independence prior to d/c. Pt with apparent left neglect/inattention.  Recommending CIR for rehab and feel pt is great candidate.     Follow Up Recommendations  CIR;Supervision/Assistance - 24 hour    Equipment Recommendations  Other (comment) (defer to next venue)    Recommendations for Other Services Rehab consult     Precautions / Restrictions Precautions Precautions: Fall Restrictions Other Position/Activity Restrictions: monitor LUE due to weakness and inattention      Mobility Bed Mobility Overal bed mobility: Needs Assistance Bed Mobility: Supine to Sit;Sit to Supine;Rolling Rolling: Mod assist/Min assist  Supine to sit: Mod assist Sit to supine: Mod assist   General bed mobility comments: assist with LE's when returning to bed. Rolled to assist with hygiene.  Transfers Overall transfer level: Needs assistance   Transfers: Sit to/from Stand;Stand Pivot Transfers Sit to Stand: Mod assist Stand pivot transfers: Max assist       General transfer comment: cues for technique.     Balance                                            ADL Overall ADL's : Needs assistance/impaired     Grooming: Brushing hair;Moderate assistance (applying lotion)   Upper Body Bathing: Sitting;Moderate assistance   Lower Body Bathing: Maximal assistance;Sit to/from stand   Upper Body Dressing : Sitting;Maximal assistance   Lower Body Dressing: Maximal assistance;Sit to/from stand   Toilet Transfer: Maximal assistance;Stand-pivot;BSC   Toileting- Clothing Manipulation and  Hygiene: Bed level;Maximal assistance;Sitting/lateral lean       Functional mobility during ADLs: Maximal assistance (stand pivot) General ADL Comments: Recommended family sit on left side to try to get pt to look to left. Pt incontinent of stool when OT arrived. Assisted in cleaning pt. Discussed with family member safety/positioning of LUE and to have it elevated on pillow. Discussed CIR with family member. Pt with decreased balance sitting EOB-leaning back at times. Tried to get pt to find objects/look to left.      Vision                 Additional Comments: pt with apparent left inattention/neglect; right visual gaze preference   Perception     Praxis      Pertinent Vitals/Pain Pain Assessment: Faces Faces Pain Scale: Hurts little more Pain Location: head Pain Descriptors / Indicators:  (rubbing head) Pain Intervention(s): Monitored during session     Hand Dominance Right   Extremity/Trunk Assessment Upper Extremity Assessment Upper Extremity Assessment: LUE deficits/detail LUE Deficits / Details: flaccid   Lower Extremity Assessment Lower Extremity Assessment: Defer to PT evaluation       Communication Communication Communication: Expressive difficulties   Cognition Arousal/Alertness: Awake/alert Behavior During Therapy: Impulsive;Restless Overall Cognitive Status: Difficult to assess Area of Impairment: Attention;Safety/judgement; following commands  Following commands: followed one step commands inconsistently   Current Attention Level: Sustained     Safety/Judgement: Decreased awareness of safety;Decreased awareness of deficits         General Comments  Exercises       Shoulder Instructions      Home Living Family/patient expects to be discharged to:: Private residence Living Arrangements: Children (daughter) Available Help at Discharge: Family;Available PRN/intermittently (ACES 3 days/week 10-2 (rides [facility] bus)) Type of Home:  House Home Access: Level entry     Home Layout: One level;Able to live on main level with bedroom/bathroom     Bathroom Shower/Tub: Walk-in shower         Home Equipment: Gilmer MorCane - single point          Prior Functioning/Environment Level of Independence: Independent        Comments: pt. overall very independent, at home up to 4 hours without supervision    OT Diagnosis: Hemiplegia non-dominant side   OT Problem List: Decreased strength;Decreased activity tolerance;Impaired balance (sitting and/or standing);Decreased knowledge of use of DME or AE;Decreased knowledge of precautions;Pain;Impaired UE functional use   OT Treatment/Interventions: Self-care/ADL training;Therapeutic exercise;Neuromuscular education;DME and/or AE instruction;Therapeutic activities;Cognitive remediation/compensation;Visual/perceptual remediation/compensation;Patient/family education;Balance training    OT Goals(Current goals can be found in the care plan section) Acute Rehab OT Goals Patient Stated Goal: unable to state OT Goal Formulation: Patient unable to participate in goal setting Time For Goal Achievement: 02/13/14 Potential to Achieve Goals: Good  OT Frequency: Min 2X/week   Barriers to D/C:            Co-evaluation              End of Session Equipment Utilized During Treatment: Gait belt Nurse Communication: Other (comment) (BM)  Activity Tolerance: Patient tolerated treatment well Patient left: in bed;with call bell/phone within reach;with bed alarm set;with family/visitor present   Time: [id number] OT Time Calculation (min): 37 min Charges:  OT General Charges $OT Visit: 1 Procedure OT Evaluation $Initial OT Evaluation Tier I: 1 Procedure OT Treatments $Self Care/Home Management : 8-22 mins G-CodesEarlie Daniel:    Sarah Daniel L OTR/L [phone] 01/30/2014, 4:36 PM

## 2014-01-30 NOTE — Progress Notes (Signed)
STROKE TEAM PROGRESS NOTE   HISTORY Zaya Daniel is an 80 y.o. female with a past medical history significant for HTN, CAD, dementia, and hypothyroidism, brought in by EMS for further evaluation of left hemiparesis, left face weakness, right gaze preference, and dysarthria. Patient is not really capable of contribute to her clinical history and thus all information is obtained from the chart.  " Per EMS, pt neighbor checked on her this afternoon and found pt on her knees in the floor with L sided weakness and slurred speech. LKW was last pm. Pt presents at this time with L weakness, slurred speech, and R sided gaze".  Reportedly last seen normal last PM.  CT brain showed cortical hypodensity in the right frontoparietal region suspicious for acute/subacute right MCA distribution infarct. No mass effect or hemorrhagic transformation.  Currently, alert and awake, follows commands.   Date last known well: 01/28/14  Time last known well: uncertain  tPA Given: no, out of the window.   SUBJECTIVE (INTERVAL HISTORY) The patient's daughter is at the bedside. Her mother lives with her. She reports that her mother had abnormal shaking spells of the left upper extremity last week which sounds like a possible limb shaking TIA. The patient has baseline dementia. Dr Roda ShuttersXu discussed the placement of an NG feeding tube as well as possible PEG placement. The patient's daughter agrees with this plan.   OBJECTIVE Temp:  [97.6 F (36.4 C)-98.4 F (36.9 C)] 97.9 F (36.6 C) (10/25 1357) Pulse Rate:  [66-94] 87 (10/25 1357) Cardiac Rhythm:  [-]  Resp:  [15-21] 16 (10/25 1357) BP: (121-188)/(65-97) 170/79 mmHg (10/25 1357) SpO2:  [96 %-100 %] 100 % (10/25 1357) Weight:  [93 lb (42.185 kg)] 93 lb (42.185 kg) (10/25 0835)  No results found for this basename: GLUCAP,  in the last 168 hours  Recent Labs Lab 01/29/14 1542 01/29/14 1555  NA 138 139  K 4.3 4.1  CL 101 104  CO2 24  --   GLUCOSE 154* 152*  BUN  23 23  CREATININE 0.90 0.90  CALCIUM 9.4  --     Recent Labs Lab 01/29/14 1542  AST 26  ALT 29  ALKPHOS 83  BILITOT 0.3  PROT 7.0  ALBUMIN 3.6    Recent Labs Lab 01/29/14 1542 01/29/14 1555  WBC 7.4  --   NEUTROABS 6.4  --   HGB 10.8* 12.9  HCT 34.1* 38.0  MCV 92.2  --   PLT 301  --    No results found for this basename: CKTOTAL, CKMB, CKMBINDEX, TROPONINI,  in the last 168 hours  Recent Labs  01/29/14 1542  LABPROT 13.2  INR 0.99   No results found for this basename: COLORURINE, APPERANCEUR, LABSPEC, PHURINE, GLUCOSEU, HGBUR, BILIRUBINUR, KETONESUR, PROTEINUR, UROBILINOGEN, NITRITE, LEUKOCYTESUR,  in the last 72 hours     Component Value Date/Time   CHOL 183 01/30/2014 0530   TRIG 101 01/30/2014 0530   HDL 102 01/30/2014 0530   CHOLHDL 1.8 01/30/2014 0530   VLDL 20 01/30/2014 0530   LDLCALC 61 01/30/2014 0530   No results found for this basename: HGBA1C   No results found for this basename: labopia,  cocainscrnur,  labbenz,  amphetmu,  thcu,  labbarb     Recent Labs Lab 01/29/14 1542  ETH <11    Dg Chest 1 View 01/29/2014    No active disease.      Ct Head Wo Contrast 01/29/2014    Suspected acute/subacute right MCA distribution infarct.  Mr Maxine GlennMra Head Wo Contrast 01/29/2014    1. Acute nonhemorrhagic anterior right MCA territory infarct.  2. More remote posterior right MCA territory infarcts.  3. Occlusion of the right internal carotid artery with partial reconstitution in the cavernous and ophthalmic segments.  4. Diminished flow intensity in the right M1 and A1 segments with no flow signal seen beyond the right MCA bifurcation.  5. Mild distal small vessel disease in the left MCA branch vessels and left greater than right PCA branch vessels.   CTA neck 1. The right internal carotid artery is occluded at the bifurcation.  It is reconstituted within the cavernous segment, likely from the  right ophthalmic artery.  2. The proximal left  subclavian artery is occluded less than 10 mm  from the aortic arch. It is reconstituted at the level of the left  vertebral artery, likely from retrograde flow is discussed.  3. Atherosclerotic changes within the left carotid artery  bifurcation without significant stenosis.  4. Atherosclerotic changes within the vertebral arteries above the  dural margin bilaterally without significant stenoses.  5. Advanced spondylosis of the cervical spine.   2D echo  - Procedure narrative: Transthoracic echocardiography. Image quality was suboptimal. The study was technically difficult, as a result of poor acoustic windows and restricted patient mobility. - Left ventricle: The cavity size was normal. Wall thickness was normal. Systolic function was normal. The estimated ejection fraction was in the range of 55% to 60%. Wall motion was normal; there were no regional wall motion abnormalities. Doppler parameters are consistent with abnormal left ventricular relaxation (grade 1 diastolic dysfunction). - Aortic valve: There was trivial regurgitation. - Mitral valve: Mildly thickened leaflets . There was mild to moderate regurgitation. - Left atrium: The atrium was moderately dilated.  EEG pending  A1C 5.3 and LDL 61  PHYSICAL EXAM  Temp:  [97.6 F (36.4 C)-98.4 F (36.9 C)] 97.7 F (36.5 C) (10/25 1713) Pulse Rate:  [69-94] 69 (10/25 1713) Resp:  [16-20] 20 (10/25 1713) BP: (121-188)/(71-97) 137/71 mmHg (10/25 1713) SpO2:  [96 %-100 %] 99 % (10/25 1713) Weight:  [93 lb (42.185 kg)] 93 lb (42.185 kg) (10/25 0835)  General - Well nourished, well developed, in no apparent distress, but significant left side neglect.  Ophthalmologic - not able to see through.  Cardiovascular - Regular rate and rhythm with no murmur.  Neuro - awake alert, severe dysarthria and not able to answer most questions, but did tell me that she born in 1923. She follows simple commands, and able to name 1/3 objects  and not repeating but largely due to neglect and lethargy after working with PT. Significant left neglect with right forced eye gaze. No blink to visual threat on the left. Left facial droop and left UE flaccid, but some antigravity movement on the LLE. RUE and RLE 4+/5. Reflex 1+, no babinski   ASSESSMENT/PLAN Ms. Sarah Daniel is a 80 y.o. female with history of  hypertension, coronary artery disease s/p stent, dementia, and hypothyroidism presenting with left hemiparesis, left face weakness, right gaze preference, and dysarthria.. She did not receive IV t-PA due to late presentation.   Daughter mentioned she had left arm shaking episodes last week and undergoing work up. I am suspect it could be limb-shaking TIA which is associated with carotid stenosis. Her MRA and CTA showed right ICA/MCA tendem occlusion, which supports that the idea of limib-sharking TIA. But will do EEG to rule out seizure vs. Limb shaking TIA. Her stroke most likely  caused by ICA stenosis. No since it is occluded as well as MCA occluded, no endovascular intervention is indicated.   Stroke:  Non-dominant infarct secondary to embolic unknown source.  MRI  see above  MRA  see above  CTA ordered and confirmed left ICA occlusion - no endovascular intervention is indicated  2D Echo unremarkable  LDL - 61, at the goal of < 70  HgbA1c 5.3 at the goal  Lovenox for VTE prophylaxis  NPO except meds with no liquids - need to consider [name] tube and PEG if no improvement  No antithrombotics prior to admission, now on Aspirin suppository 300 mg daily.  Ongoing aggressive risk factor management  Resultant aphasia, left neglect, and left hemiparesis.  Therapy recommendations: - CIR  Left carotid stenosis/occlusion - CTA and MRA confirmed - likely to explain the left arm shaking episodes last week - no endovascular intervention indicated - BP control is the key - Permissive hypertension (OK if < 220/120) but gradually  normalize to keep SBP 130-150 in 5-7 days - EEG to rule out seizure like activity, but less likely  Hypertension History  Home meds:   No antihypertensive medications prior to admission.   Stable  BP long term goal 130-150   Permissive hypertension (OK if < 220/120) but gradually normalize to keep SBP 130-150 in 5-7 days  Hyperlipidemia  Home meds:  Lipitor 40 mg daily prior to admission. Not resumed in hospital as patient is NPO.  LDL 61 goal < 70  Resume Lipitor when swallowing has been cleared.  Continue statin at discharge  Other Stroke Risk Factors Advanced age   Hx stroke/TIA   Coronary artery disease  Other Active Problems  Dementia  N.p.o. - place NG feeding tube - may need PEG.  Hospital day # 1  Delton See PA-C [facility] Pager [phone] 01/30/2014, 2:46 PM  I, the attending vascular neurologist, have personally obtained a history, examined the patient, evaluated laboratory data, individually viewed imaging studies, and formulated the assessment and plan of care.  I have made any additions or clarifications directly to the above note and agree with the findings and plan as currently documented.   Please note: All text in blue color in the note is my addition to the original note.   Marvel Plan, MD PhD Stroke Neurology 01/30/2014 6:28 PM   To contact Stroke Continuity provider, please refer to [email or web address]. After hours, contact General Neurology

## 2014-01-30 NOTE — Progress Notes (Signed)
  Echocardiogram 2D Echocardiogram has been performed.  [name], [name] 01/30/2014, 4:01 PM

## 2014-01-30 NOTE — Evaluation (Signed)
Clinical/Bedside Swallow Evaluation Patient Details  Name: Sarah Daniel MRN: [id number] Date of Birth: [birth year]  Today's Date: 01/30/2014 Time: 1020-1039 SLP Time Calculation (min): 19 min  Past Medical History:  Past Medical History  Diagnosis Date  . Dementia   . Hypertension   . GERD (gastroesophageal reflux disease)   . Thyroid disease   . PUD (peptic ulcer disease)   . Coronary artery disease   . Hypothyroidism   . Arthritis    Past Surgical History:  Past Surgical History  Procedure Laterality Date  . Abdominal hysterectomy     HPI:  80 y.o. female with PMH of CAD, HTN, mild dementia, hypothyroidism was brought to the ER by EMS after her daughter found her down beside her bed - she had a facial droop and L hemiparesis, L neglect.  MRI confirmed acute nonhemorrhagic anterior right MCA territory infarct, remote posterior right MCA territory infarct.  Failed RN stroke swallow screen.     Assessment / Plan / Recommendation Clinical Impression  Pt presents with a neurogenic dysphagia, compromised further by decreased MS and difficulty maintaining alertness for safe PO consumption.  Weak throat clearing associated with ice chips/water, suggestive of compromised airway protection.  When alert, pt able to consume purees in limited quantities safely.  Recommend NPO except meds crushed in puree.  SLP will return next date to determine PO readiness and complete speech/communication eval if pt's MS allows.  Discussed with daughter, who is in agreement with plan.    Aspiration Risk  Moderate    Diet Recommendation NPO except meds   Medication Administration: Crushed with puree Postural Changes and/or Swallow Maneuvers: Seated upright 90 degrees    Other  Recommendations Oral Care Recommendations:  (QD)   Follow Up Recommendations       Frequency and Duration min 2x/week  2 weeks       SLP Swallow Goals     Swallow Study Prior Functional Status       General Date of  Onset: 01/29/14 HPI: 80 y.o. female with PMH of CAD, HTN, mild dementia, hypothyroidism was brought to the ER by EMS after her daughter found her down beside her bed - she had a facial droop and L hemiparesis, L neglect.  MRI confirmed acute nonhemorrhagic anterior right MCA territory infarct, remote posterior right MCA territory infarct.  Failed RN stroke swallow screen.   Type of Study: Bedside swallow evaluation Previous Swallow Assessment: none per records Diet Prior to this Study: NPO Temperature Spikes Noted: No Respiratory Status: Room air History of Recent Intubation: No Behavior/Cognition: Lethargic Oral Cavity - Dentition: Adequate natural dentition Self-Feeding Abilities: Needs assist Patient Positioning: Upright in bed Baseline Vocal Quality: Clear Volitional Cough: Cognitively unable to elicit Volitional Swallow: Unable to elicit    Oral/Motor/Sensory Function Overall Oral Motor/Sensory Function:  (central left asymmetry CN VII)   Ice Chips Ice chips: Impaired Presentation: Spoon Oral Phase Impairments: Reduced labial seal Pharyngeal Phase Impairments: Suspected delayed Swallow;Throat Clearing - Immediate   Thin Liquid Thin Liquid: Not tested    Nectar Thick Nectar Thick Liquid: Not tested   Honey Thick Honey Thick Liquid: Not tested   Puree Puree: Impaired Presentation: Spoon Pharyngeal Phase Impairments: Suspected delayed Swallow;Multiple swallows   Solid  Temprance Wyre L. Dunmoreouture, [place]MA CCC/SLP Pager [phone]     Solid: Not tested       Blenda Mountsouture, Akaisha Truman Laurice 01/30/2014,10:50 AM

## 2014-01-30 NOTE — Progress Notes (Signed)
SLP Cancellation Note  Patient Details Name: Staceyann Guinea-BissauFrance MRN: [id number] DOB: [birth year]   Cancelled treatment:       Reason Eval/Treat Not Completed: Patient preparing for angiogram per RN; will return this pm.   Blenda Mountsouture, Brendin Situ Laurice 01/30/2014, 9:45 AM

## 2014-01-31 ENCOUNTER — Inpatient Hospital Stay (HOSPITAL_COMMUNITY): Payer: Medicare Other

## 2014-01-31 DIAGNOSIS — N183 Chronic kidney disease, stage 3 (moderate): Secondary | ICD-10-CM

## 2014-01-31 DIAGNOSIS — I6309 Cerebral infarction due to thrombosis of other precerebral artery: Secondary | ICD-10-CM

## 2014-01-31 DIAGNOSIS — G8100 Flaccid hemiplegia affecting unspecified side: Secondary | ICD-10-CM

## 2014-01-31 LAB — GLUCOSE, CAPILLARY
GLUCOSE-CAPILLARY: 102 mg/dL — AB (ref 70–99)
Glucose-Capillary: 99 mg/dL (ref 70–99)

## 2014-01-31 MED ORDER — JEVITY 1.2 CAL PO LIQD
1000.0000 mL | ORAL | Status: DC
  Filled 2014-01-31 (×3): qty 1000

## 2014-01-31 MED ORDER — PANTOPRAZOLE SODIUM 40 MG IV SOLR
40.0000 mg | INTRAVENOUS | Status: DC
  Administered 2014-01-31: 40 mg via INTRAVENOUS
  Filled 2014-01-31: qty 40

## 2014-01-31 NOTE — Progress Notes (Signed)
*  PRELIMINARY RESULTS* Vascular Ultrasound Carotid Duplex (Doppler) has been completed. Findings suggest 1-39% left internal carotid artery stenosis. Unable to detect color flow in the right internal carotid artery, this along with severe plaque formation is suggestive of possible occlusion. The right vertebral is patent with antegrade flow. The left vertebral artery is patent with retrograde flow. Right brachial artery pressure is 166/69, left brachial artery pressure is 142/80. These findings along with a brachial artery pressure difference of 24mmHg is suggestive of possible steal syndrome.    01/31/2014 4:51 PM Gertie FeyMichelle Mirakle Tomlin, RVT, RDCS, RDMS

## 2014-01-31 NOTE — Evaluation (Signed)
Supervised and reviewed by Silas Muff MA CCC-SLP  

## 2014-01-31 NOTE — Progress Notes (Signed)
Speech Language Pathology Treatment: Dysphagia  Patient Details Name: Sarah Daniel MRN: [id number] DOB: 27-Jun-1921 Today's Date: 01/31/2014 Time: [id number] SLP Time Calculation (min): 29 min  Assessment / Plan / Recommendation Clinical Impression  Pt continues to demonstrate neurogenic and cognitively based swallow impairments. She has decreased oral strength and awareness of the bolus resulting in oral holding, delay, anterior spillage, and multiple swallows. With SLP providing maximum verbal, visual, and tactile cues for initiation of swallow sequence, pt was able to initiate only after protracted periods of time requiring suction to remove oral contents. Pt is only able to follow oro-facial commands given tactile cues, showing signs of a bucco-facial apraxia. Her ability to sustain attention is impaired, impacting her ability to eat safely and increasing her aspiration risk. Recommend that pt remain NPO at this time. Expect improvement as cognitive function recovers. SLP to follow up with further PO trials as appropriate.    HPI HPI: 80 y.o. female with PMH of CAD, HTN, mild dementia, hypothyroidism was brought to the ER by EMS after her daughter found her down beside her bed - she had a facial droop and L hemiparesis, L neglect.  MRI confirmed acute nonhemorrhagic anterior right MCA territory infarct, remote posterior right MCA territory infarct.  Failed RN stroke swallow screen.     Pertinent Vitals Pain Assessment: No/denies pain  SLP Plan  Continue with current plan of care    Recommendations Diet recommendations: NPO Liquids provided via: Cup Medication Administration: Via alternative means              Oral Care Recommendations: Oral care BID Follow up Recommendations: Inpatient Rehab Plan: Continue with current plan of care    GO     Barkley BrunsO'Brien, Sarah 01/31/2014, 1:06 PM

## 2014-01-31 NOTE — Progress Notes (Signed)
Routine EEG completed, no family members in room.  Results pending.

## 2014-01-31 NOTE — Progress Notes (Signed)
CARE MANAGEMENT NOTE 01/31/2014  Patient:  Sarah Daniel,Sarah Daniel   Account Number:  [id number]  Date Initiated:  01/31/2014  Documentation initiated by:  Jiles CrockerHANDLER,Vanita Cannell  Subjective/Objective Assessment:   ADMITTED FOR STROKE     Action/Plan:   CM FOLLOWING FOR DCP   Anticipated DC Date:  02/03/2014   Anticipated DC Plan:  IP REHAB FACILITY      DC Planning Services  CM consult          Status of service:  In process, will continue to follow Medicare Important Message given?   (If response is "NO", the following Medicare IM given date fields will be blank)  Per UR Regulation:  Reviewed for med. necessity/level of care/duration of stay  Comments:  10/26/2015Abelino Derrick- B Clorine Swing RN,BSN,MHA [phone]

## 2014-01-31 NOTE — Progress Notes (Signed)
Physical Therapy Treatment Patient Details Name: Sarah Daniel MRN: [id number] DOB: [birth year] Today's Date: 01/31/2014    History of Present Illness Adm 01/29/14 after found down at home with Lt sided weakness. + Rt frontoparietal CVA PMHx- HTN, dementia, CAD    PT Comments    Patient more internally and externally distracted today. Pt with difficulty staying on task when holding IV pole during ambulation (letting go to reach for nearby supports- BSC, curtain). Incr difficulty turning to her left (as to be expected due to visual deficit). Daughter present during session.  Follow Up Recommendations  CIR;Supervision/Assistance - 24 hour     Equipment Recommendations   (TBA)    Recommendations for Other Services       Precautions / Restrictions Precautions Precautions: Fall Restrictions Other Position/Activity Restrictions: monitor LUE due to weakness and inattention    Mobility  Bed Mobility Overal bed mobility: Needs Assistance Bed Mobility: Rolling;Sidelying to Sit Rolling: Min assist Sidelying to sit: Min assist (with rail)       General bed mobility comments: min assist to find LUE with Rt and bring across body; pt then used RUE and rail to roll and come to sit; min assist for LLe guidance over EOB (inattention)  Transfers Overall transfer level: Needs assistance Equipment used: 1 person hand held assist Transfers: Sit to/from UGI CorporationStand;Stand Pivot Transfers Sit to Stand: Mod assist Stand pivot transfers: Mod assist       General transfer comment: assist to obtain proper position pror to stand; pt with lean to her Left; good weightbearing through LLE  Ambulation/Gait Ambulation/Gait assistance: Mod assist Ambulation Distance (Feet): 40 Feet Assistive device:  (RUE pushing IV pole) Gait Pattern/deviations: Step-to pattern;Decreased step length - right;Decreased stance time - left;Decreased weight shift to right;Steppage;Wide base of support (Lt knee  hyperextension)   Gait velocity interpretation: <1.8 ft/sec, indicative of risk for recurrent falls General Gait Details: bearing weight on LLE without buckling, however required assist to prevent knee hyperextension and occasional placement assist at pelvis and ribs for weightshifting/balance; pt easily distracted and with much difficulty turning to her Left   Stairs            Wheelchair Mobility    Modified Rankin (Stroke Patients Only) Modified Rankin (Stroke Patients Only) Pre-Morbid Rankin Score: No symptoms Modified Rankin: Moderately severe disability     Balance     Sitting balance-Leahy Scale: Poor Sitting balance - Comments: Lt lean, could correct to midline without assist at beginning of session; by the end, was fatigued and could initiate shift to midline and required assist to achieve   Standing balance support: No upper extremity supported Standing balance-Leahy Scale: Poor                      Cognition Arousal/Alertness: Awake/alert Behavior During Therapy: Restless;Impulsive Overall Cognitive Status: Difficult to assess                      Exercises      General Comments General comments (skin integrity, edema, etc.): Incr difficulty turning her head to left and eyes not passing midline; using tissue in Rt hand to wipe Lt side of mouth independently; perseverated on wiping her bottom after use of BSC      Pertinent Vitals/Pain Pain Assessment: No/denies pain    Home Living                      Prior Function  PT Goals (current goals can now be found in the care plan section) Acute Rehab PT Goals Patient Stated Goal: unable to state; agrees she wants to walk Progress towards PT goals: Progressing toward goals    Frequency  Min 4X/week    PT Plan Current plan remains appropriate    Co-evaluation             End of Session Equipment Utilized During Treatment: Gait belt Activity Tolerance:  Patient tolerated treatment well Patient left: in chair;with chair alarm set;with call bell/phone within reach;with family/visitor present;Other (comment) (Rehab admissions coordinator)     Time: [id number] PT Time Calculation (min): 36 min  Charges:  $Gait Training: 8-22 mins $Therapeutic Activity: 8-22 mins                    G Codes:      Abdalla Naramore 01/31/2014, 11:05 AM Pager [phone]

## 2014-01-31 NOTE — Progress Notes (Addendum)
STROKE TEAM PROGRESS NOTE   HISTORY Sarah Guinea-BissauFrance is an 80 y.o. female with a past medical history significant for HTN, CAD, dementia, and hypothyroidism, brought in by EMS for further evaluation of left hemiparesis, left face weakness, right gaze preference, and dysarthria. Patient is not really capable of contribute to her clinical history and thus all information is obtained from the chart. " Per EMS, pt neighbor checked on her this afternoon and found pt on her knees in the floor with L sided weakness and slurred speech. LKW was last pm. Pt presents at this time with L weakness, slurred speech, and R sided gaze".  Reportedly last seen normal last PM, 01/28/2014, time unknown. CT brain showed cortical hypodensity in the right frontoparietal region suspicious for acute/subacute right MCA distribution infarct. No mass effect or hemorrhagic transformation. tPA Given: no, out of the window.   SUBJECTIVE (INTERVAL HISTORY) Her daughter is at bedside. Patient just evaluated by ST and failed - they recommend NPO for now. EEG done showed no seizure. Still with L sided weakness and profound left side neglect. Discussed with daughter regarding [name] tube.  OBJECTIVE  Recent Labs Lab 01/29/14 1542 01/29/14 1555  NA 138 139  K 4.3 4.1  CL 101 104  CO2 24  --   GLUCOSE 154* 152*  BUN 23 23  CREATININE 0.90 0.90  CALCIUM 9.4  --     Recent Labs Lab 01/29/14 1542  AST 26  ALT 29  ALKPHOS 83  BILITOT 0.3  PROT 7.0  ALBUMIN 3.6    Recent Labs Lab 01/29/14 1542 01/29/14 1555  WBC 7.4  --   NEUTROABS 6.4  --   HGB 10.8* 12.9  HCT 34.1* 38.0  MCV 92.2  --   PLT 301  --     Recent Labs  01/29/14 1542  LABPROT 13.2  INR 0.99      Component Value Date/Time   CHOL 183 01/30/2014 0530   TRIG 101 01/30/2014 0530   HDL 102 01/30/2014 0530   CHOLHDL 1.8 01/30/2014 0530   VLDL 20 01/30/2014 0530   LDLCALC 61 01/30/2014 0530   Lab Results  Component Value Date   HGBA1C 5.3  01/30/2014     Recent Labs Lab 01/29/14 1542  ETH <11    Dg Chest 1 View 01/29/2014    No active disease.      Ct Head Wo Contrast 01/29/2014    Suspected acute/subacute right MCA distribution infarct.    Mr Sarah GlennMra Head Wo Contrast 01/29/2014    1. Acute nonhemorrhagic anterior right MCA territory infarct.  2. More remote posterior right MCA territory infarcts.  3. Occlusion of the right internal carotid artery with partial reconstitution in the cavernous and ophthalmic segments.  4. Diminished flow intensity in the right M1 and A1 segments with no flow signal seen beyond the right MCA bifurcation.  5. Mild distal small vessel disease in the left MCA branch vessels and left greater than right PCA branch vessels.   CTA neck 1. The right internal carotid artery is occluded at the bifurcation. It is reconstituted within the cavernous segment, likely from the right ophthalmic artery.  2. The proximal left subclavian artery is occluded less than 10 mm from the aortic arch. It is reconstituted at the level of the left vertebral artery, likely from retrograde flow is discussed.  3. Atherosclerotic changes within the left carotid artery bifurcation without significant stenosis.  4. Atherosclerotic changes within the vertebral arteries above the dural margin  bilaterally without significant stenoses.  5. Advanced spondylosis of the cervical spine.   2D echo  - Procedure narrative: Transthoracic echocardiography. Image quality was suboptimal. The study was technically difficult, as a result of poor acoustic windows and restricted patient mobility. - Left ventricle: The cavity size was normal. Wall thickness was normal. Systolic function was normal. The estimated ejection fraction was in the range of 55% to 60%. Wall motion was normal; there were no regional wall motion abnormalities. Doppler parameters are consistent with abnormal left ventricular relaxation (grade 1 diastolic dysfunction). -  Aortic valve: There was trivial regurgitation. - Mitral valve: Mildly thickened leaflets . There was mild to moderate regurgitation. - Left atrium: The atrium was moderately dilated.  EEG  Impression:  This awake and asleep EEG is abnormal due to the presence of:  1. Mild diffuse slowing of the waking background  2. Additional focal slowing over the right hemisphere  Clinical Correlation of the above findings indicates diffuse cerebral dysfunction that is non-specific in etiology. Additional focal slowing over the right hemisphere indicates focal cerebral dysfunction in this region suggestive of underlying structural or physiologic abnormality. The absence of epileptiform discharges does not rule out a clinical diagnosis of epilepsy. Clinical correlation is advised.  CUS - suggest 1-39% left internal carotid artery stenosis. Unable to detect color flow in the right internal carotid artery, this along with severe plaque formation is suggestive of possible occlusion. The right vertebral is patent with antegrade flow. The left vertebral artery is patent with retrograde flow. Right brachial artery pressure is 166/69, left brachial artery pressure is 142/80. These findings along with a brachial artery pressure difference of is suggestive of possible steal syndrome.   PHYSICAL EXAM Temp:  [97.7 F (36.5 C)-98.5 F (36.9 C)] 98.1 F (36.7 C) (10/26 0959) Pulse Rate:  [69-87] 76 (10/26 0959) Resp:  [16-20] 18 (10/26 0959) BP: (126-170)/(64-83) 158/64 mmHg (10/26 0959) SpO2:  [98 %-100 %] 99 % (10/26 0959)  General - Well nourished, well developed, in no apparent distress, but significant left side neglect.  Ophthalmologic - not able to see through.  Cardiovascular - Regular rate and rhythm with no murmur.  Neuro - awake alert, severe dysarthria and not able to answer most questions, but did tell me that she born in 1923. She follows simple commands, and able to name 1/3 objects and not  repeating but largely due to neglect and lethargy after working with PT. Significant left neglect with right forced eye gaze. No blink to visual threat on the left. Left facial droop and left UE flaccid, but some antigravity movement on the LLE. RUE and RLE 4+/5. Reflex 1+, no babinski   ASSESSMENT/PLAN Ms. Kwynn Daniel is a 80 y.o. female with history of  hypertension, coronary artery disease s/p stent, dementia, and hypothyroidism presenting with left hemiparesis, left face weakness, right gaze preference, and dysarthria.. She did not receive IV t-PA due to late presentation.   Stroke:  Non-dominant anterior right MCA territory infarct secondary to unknown embolic source.  CTA ordered and confirmed left ICA occlusion - no endovascular intervention is indicated - also show left subclavian artery occlusion.  2D Echo unremarkable  CUS confirmed right ICA occlusion but also confirmed left subclavian steal syndrome which is asymptomatic now.   LDL - 61, at the goal of < 70  HgbA1c 5.3 at the goal  Lovenox for VTE prophylaxis  NPO except meds with no liquids. Failed repeat swallow eval this am. We recommend [name] tube  placement with TF. consider PEG later.  No antithrombotics prior to admission, now on Aspirin suppository 300 mg daily.  Ongoing aggressive risk factor management  Resultant dysphagia, aphasia, left neglect, and left hemiparesis.  Therapy recommendations: - CIR  Left carotid stenosis/occlusion - CTA and MRA confirmed - likely to explain the left arm shaking episodes last week -  limb-shaking TIA which is associated with carotid stenosis - no endovascular intervention indicated - BP control is the key - Permissive hypertension (OK if < 220/120) but gradually normalize to keep SBP 130-150 in 5-7 days - EEG ruled out seizure like activity  Hypertension History  Home meds:   No antihypertensive medications prior to admission.   Stable  BP long term goal 130-150    Permissive hypertension (OK if < 220/120) but gradually normalize to keep SBP 130-150 in 5-7 days  Hyperlipidemia  Home meds:  Lipitor 40 mg daily prior to admission. Not resumed in hospital as patient is NPO.  LDL 61 goal < 70  Resume Lipitor when swallowing cleared or tube placed  Continue statin at discharge  Other Stroke Risk Factors Advanced age   Hx stroke/TIA   Coronary artery disease  Other Active Problems  Dementia  Hospital day # 2  SHARON BIBY, MSN, RN, ANVP-BC, ANP-BC, Lawernce IonGNP-BC [facility] Pager: [phone] 01/31/2014 11:57 AM   I, the attending vascular neurologist, have personally obtained a history, examined the patient, evaluated laboratory data, individually viewed imaging studies, and formulated the assessment and plan of care.  I have made any additions or clarifications directly to the above note and agree with the findings and plan as currently documented.   Neurology will sign off. Please call with questions. [facility]. Thanks for the consult.  Marvel PlanJindong Shaia Porath, MD PhD Stroke Neurology 01/31/2014 6:47 PM   To contact Stroke Continuity provider, please refer to [email or web address]. After hours, contact General Neurology

## 2014-01-31 NOTE — Progress Notes (Signed)
Supervised and reviewed by Kala Ambriz MA CCC-SLP  

## 2014-01-31 NOTE — Procedures (Signed)
ELECTROENCEPHALOGRAM REPORT  Date of Study: 01/31/2014  Patient's Name: Sarah Daniel MRN: [id number] Date of Birth: 01/05/1922  Referring Provider: Delton Seeavid Rinehuls  Clinical History: This is a 80 year old woman with left hemiparesis, left face weakness, right gaze preference, and dysarthria with acute/subacute right MCA distribution infarct.  Medications: asa, aricept, lovenox, protonix, effexor  Technical Summary: A multichannel digital EEG recording measured by the international 10-20 system with electrodes applied with paste and impedances below 5000 ohms performed as portable with EKG monitoring in an awake and asleep patient.  Hyperventilation and photic stimulation were not performed.  The digital EEG was referentially recorded, reformatted, and digitally filtered in a variety of bipolar and referential montages for optimal display.   Description: The patient is awake and asleep during the recording.  During maximal wakefulness, there is an asymmetric, medium voltage 8 Hz posterior dominant rhythm better formed over the left occipital region.  This is admixed with a small amount of diffuse 4-5 Hz theta slowing with additional focal near-continuous theta and delta slowing over the right hemisphere.  During drowsiness and sleep, there is an increase in theta and delta slowing of the background.  Vertex waves and asymmetric sleep spindles better formed over the left parasagittal derivations were seen. There were no epileptiform discharges or electrographic seizures seen.    EKG lead was unremarkable.  Impression: This awake and asleep EEG is abnormal due to the presence of: 1. Mild diffuse slowing of the waking background 2. Additional focal slowing over the right hemisphere  Clinical Correlation of the above findings indicates diffuse cerebral dysfunction that is non-specific in etiology. Additional focal slowing over the right hemisphere indicates focal cerebral dysfunction in this  region suggestive of underlying structural or physiologic abnormality.  The absence of epileptiform discharges does not rule out a clinical diagnosis of epilepsy.  Clinical correlation is advised.   Patrcia DollyKaren Aquino, M.D.

## 2014-01-31 NOTE — Progress Notes (Signed)
Rehab admissions - I received a call back from pt's daughter and then met with her in person. Pt was in the process of leaving for EEG testing. Dtr has spoken with family and MD and they would like to pursue inpatient rehab. In regards to be able to provide needed assistance for her mom, dtr stated, "we will do what it takes and cross those bridges when they come."  Pt's daughter also asked how to start the process to apply for Medicaid. I called financial counselor and she recommended that pt's dtr get application from Department of Social Services. Dtr was understanding of this recommendation.  I will now open the case with [facility] and seek insurance authorization. We will consider possible inpatient rehab stay pending her medical clearance and completed stroke work up, insurance authorization and our bed availability.  I will keep the pt/family and medical team aware of any updates as I wait to hear from [facility].  Please call me with any questions. Thanks.  Nanetta Batty, PT Rehabilitation Admissions Coordinator [phone]'

## 2014-01-31 NOTE — Consult Note (Signed)
Physical Medicine and Rehabilitation Consult Reason for Consult: CVA Referring Physician: [facility]   HPI: Sarah Daniel is a 80 y.o. right handed female with history of dementia maintained on Aricept, coronary artery disease, hypertension. By report patient lives with her daughter independent prior to admission and assistance as needed. Presented 01/29/2014 after being found down by her daughter with left-sided weakness and facial droop. Patient was a poor medical historian. MRI/MRA of the brain shows acute nonhemorrhagic anterior right MCA territory infarct as well as occlusion of the right internal carotid artery with partial reconstitution of the cavernous and ophthalmic segments. CT angiogram of the neck again showing right internal carotid artery occlusion as well as proximal left subclavian artery occlusion less than 10 mm from the aortic arch. Patient did not receive TPA. Echocardiogram with ejection fraction of 60% grade 1 diastolic dysfunction. EEG is pending. Neurology service is consulted maintained on aspirin for CVA prophylaxis as well as subcutaneous Lovenox for DVT prophylaxis. Swallow evaluation per speech therapy 01/30/2014 patient currently nothing by mouth with question plan nasogastric tube versus PEG tube. Physical occupational therapy evaluations completed with recommendations of physical medicine rehabilitation consult.   Review of Systems  Unable to perform ROS: language   Past Medical History  Diagnosis Date  . Dementia   . Hypertension   . GERD (gastroesophageal reflux disease)   . Thyroid disease   . PUD (peptic ulcer disease)   . Coronary artery disease   . Hypothyroidism   . Arthritis    Past Surgical History  Procedure Laterality Date  . Abdominal hysterectomy     History reviewed. No pertinent family history. Social History:  reports that she has quit smoking. She has never used smokeless tobacco. She reports that she does not drink alcohol or use  illicit drugs. Allergies: No Known Allergies Medications Prior to Admission  Medication Sig Dispense Refill  . atorvastatin (LIPITOR) 40 MG tablet Take 40 mg by mouth daily.      . cholecalciferol (VITAMIN D) 1000 UNITS tablet Take 1,000 Units by mouth daily.      Marland Kitchen donepezil (ARICEPT) 10 MG tablet Take 10 mg by mouth daily.       . IRON PO Take 1 tablet by mouth daily.      Marland Kitchen levothyroxine (SYNTHROID, LEVOTHROID) 50 MCG tablet Take 50 mcg by mouth daily before breakfast.      . venlafaxine XR (EFFEXOR-XR) 75 MG 24 hr capsule Take 75 mg by mouth daily with breakfast.        Home: Home Living Family/patient expects to be discharged to:: Private residence Living Arrangements: Children (daughter) Available Help at Discharge: Family;Available PRN/intermittently ([facility] 3 days/week 10-2 (rides [employer] bus)) Type of Home: House Home Access: Level entry Home Layout: One level;Able to live on main level with bedroom/bathroom Home Equipment: Cane - single point Additional Comments: walk-in shower  Functional History: Prior Function Level of Independence: Independent Comments: pt. overall very independent, at home up to 4 hours without supervision Functional Status:  Mobility: Bed Mobility Overal bed mobility: Needs Assistance Bed Mobility: Supine to Sit;Sit to Supine;Rolling Rolling: Mod assist Sidelying to sit: Mod assist Supine to sit: Mod assist Sit to supine: Mod assist Sit to sidelying: Mod assist General bed mobility comments: assist with LE's when returning to bed. Rolled to assist with hygiene. Transfers Overall transfer level: Needs assistance Equipment used: 1 person hand held assist Transfers: Sit to/from [employer] Sit to Stand: Mod assist Stand pivot transfers:  Mod assist General transfer comment: cues for technique.  Ambulation/Gait Ambulation/Gait assistance: Max assist Ambulation Distance (Feet): 6 Feet Assistive device: 1 person hand held  assist Gait Pattern/deviations: Step-to pattern;Decreased stride length;Decreased dorsiflexion - left;Decreased weight shift to right;Shuffle Gait velocity interpretation: <1.8 ft/sec, indicative of risk for recurrent falls General Gait Details: bearing weight on LLE without buckling, however required assist to advance and place LLE; assist at pelvis and ribs for weightshifting/balance; as approached chair, IV team arrived to place IV for pt to go down for CT angiogram and returned pt to bed    ADL: ADL Overall ADL's : Needs assistance/impaired Grooming: Brushing hair;Moderate assistance (applying lotion) Upper Body Bathing: Sitting;Moderate assistance Lower Body Bathing: Maximal assistance;Sit to/from stand Upper Body Dressing : Sitting;Maximal assistance Lower Body Dressing: Maximal assistance;Sit to/from stand Toilet Transfer: Stand-pivot;BSC;Maximal assistance Toileting- Clothing Manipulation and Hygiene: Bed level;Maximal assistance;Sitting/lateral lean Functional mobility during ADLs: Maximal assistance (stand pivot) General ADL Comments: Recommended family sit on left side to try to get pt to look to left. Pt incontinent of stool when OT arrived. Assisted in cleaning pt. Discussed with family member safety/positioning of LUE and to have it elevated on pillow. Discussed CIR with family member. Pt with decreased balance sitting EOB-leaning back at times.  Cognition: Cognition Overall Cognitive Status: Difficult to assess Orientation Level: Oriented to person;Oriented to place Cognition Arousal/Alertness: Awake/alert Behavior During Therapy: Impulsive;Restless Overall Cognitive Status: Difficult to assess Area of Impairment: Attention;Safety/judgement;Following commands Current Attention Level: Sustained Following Commands: Follows one step commands inconsistently Safety/Judgement: Decreased awareness of safety;Decreased awareness of deficits Difficult to assess due to: Impaired  communication  Blood pressure 126/75, pulse 79, temperature 98.5 F (36.9 C), temperature source Oral, resp. rate 18, height 4\' 9"  (1.448 m), weight 42.185 kg (93 lb), SpO2 99.00%. Physical Exam  HENT:  Left facial droop  Eyes:  Pupils reactive to light  Neck: Normal range of motion. Neck supple. No thyromegaly present.  Cardiovascular: Normal rate and regular rhythm.   Respiratory: Effort normal and breath sounds normal. No respiratory distress.  GI: Soft. Bowel sounds are normal. She exhibits no distension.  Neurological: She is alert.  Patient displays a severe left inattention. (neck/SCM is actually tight due to severe rotation to right)She would follow some inconsistent commands. She did attempt mouth most words but mostly unintelligible. Makes little eye contact. 0/5 both UE and LE. Does not react to pain on left.   Skin: Skin is warm and dry.  Psychiatric:  Flat, did not engage    No results found for this or any previous visit (from the past 24 hour(s)). Dg Chest 1 View  01/29/2014   CLINICAL DATA:  Left-sided weakness, slurred speech. History of hypertension. Found on floor.  EXAM: CHEST - 1 VIEW  COMPARISON:  03/08/2013  FINDINGS: Heart is normal size. No confluent airspace opacities or effusions. Calcifications in the aortic arch. No visible aneurysm. No acute bony abnormality.  IMPRESSION: No active disease.   Electronically Signed   By: Charlett Nose M.D.   On: 01/29/2014 17:03   Ct Head Wo Contrast  01/29/2014   CLINICAL DATA:  Stroke symptoms, left-sided weakness, slurred speech.  EXAM: CT HEAD WITHOUT CONTRAST  TECHNIQUE: Contiguous axial images were obtained from the base of the skull through the vertex without intravenous contrast.  COMPARISON:  None.  FINDINGS: Cortical hypodensity in the right frontoparietal region (series 201/ image 16), suspicious for acute/subacute right MCA distribution infarct.  No evidence of parenchymal hemorrhage or extra-axial fluid collection.  No mass lesion, mass effect, or midline shift.  Subcortical white matter and periventricular small vessel ischemic changes. Intracranial atherosclerosis.  Global cortical atrophy.  No ventriculomegaly.  The visualized paranasal sinuses are essentially clear. The mastoid air cells are unopacified.  No evidence of calvarial fracture.  IMPRESSION: Suspected acute/subacute right MCA distribution infarct.  These results were called by telephone at the time of interpretation on 01/29/2014 at 4:49 pm to Dr. Lynelle DoctorKnapp, who verbally acknowledged these results.   Electronically Signed   By: Charline BillsSriyesh  Krishnan M.D.   On: 01/29/2014 16:49   Ct Angio Neck W/cm &/or Wo/cm  01/30/2014   CLINICAL DATA:  Acute right MCA territory infarct. Right internal carotid artery occlusion.  EXAM: CT ANGIOGRAPHY NECK  TECHNIQUE: Multidetector CT imaging of the neck was performed using the standard protocol during bolus administration of intravenous contrast. Multiplanar CT image reconstructions and MIPs were obtained to evaluate the vascular anatomy. Carotid stenosis measurements (when applicable) are obtained utilizing NASCET criteria, using the distal internal carotid diameter as the denominator.  CONTRAST:  50 mL Omnipaque 350  COMPARISON:  MRI brain and MRA head 01/29/2014  FINDINGS: There is a common origin of the left common carotid artery and the innominate artery. Dense atherosclerotic calcifications are present in the distal arch. The subclavian artery is occluded less than 1 cm from the origin. It is reconstituted at the level of the left vertebral artery.  The vertebral arteries originate from the subclavian arteries bilaterally. Calcifications are present at the origin of the vertebral arteries without significant stenosis. There is marked tortuosity in the proximal right vertebral artery with a 50% stenosis. Contrast density is less and the proximal left vertebral artery been in the right compatible with a subclavian steal  phenomenon. There are no significant vertebral artery stenoses in the neck. Atherosclerotic irregularity is present within the distal vertebral arteries bilaterally, left greater than right without significant stenosis. The PICA origins are visualized and normal.  The right common carotid artery is tortuous proximally without significant stenosis. The right internal carotid artery is occluded at its origin. There is no reconstitution in the neck. The artery is reconstituted within the cavernous segments, likely from the ophthalmic artery. Dense calcifications are present within the cavernous right internal carotid artery.  The left common carotid artery is within normal limits. The bifurcation demonstrates atherosclerotic change without a significant stenosis relative to the distal vessel. The cervical and proximal intracranial left ICA is unremarkable.  Limited imaging of the brain again demonstrates the right MCA territory infarcts.  There is fusion of posterior elements at C2-3. Anterolisthesis at C4-5 and C5-6 appears degenerative with advanced facet disease at both levels. There is chronic loss of height and endplate changes at C6-7 and to lesser extent at C7-T1. No acute osseous abnormality is present.  Review of the MIP images confirms the above findings.  IMPRESSION: 1. The right internal carotid artery is occluded at the bifurcation. It is reconstituted within the cavernous segment, likely from the right ophthalmic artery. 2. The proximal left subclavian artery is occluded less than 10 mm from the aortic arch. It is reconstituted at the level of the left vertebral artery, likely from retrograde flow is discussed. 3. Atherosclerotic changes within the left carotid artery bifurcation without significant stenosis. 4. Atherosclerotic changes within the vertebral arteries above the dural margin bilaterally without significant stenoses. 5. Advanced spondylosis of the cervical spine.   Electronically Signed   By:  Gennette Pachris  Mattern M.D.   On: 01/30/2014 13:09  Mr Maxine GlennMra Head Wo Contrast  01/29/2014   CLINICAL DATA:  Left-sided weakness, slurred speech, and rightward gaze. Headaches. Stroke.  EXAM: MRI HEAD WITHOUT CONTRAST  MRA HEAD WITHOUT CONTRAST  TECHNIQUE: Multiplanar, multiecho pulse sequences of the brain and surrounding structures were obtained without intravenous contrast. Angiographic images of the head were obtained using MRA technique without contrast.  COMPARISON:  CT head from the same day.  FINDINGS: MRI HEAD FINDINGS  The diffusion-weighted images confirm an acute nonhemorrhagic anterior right MCA territory infarct involving the right frontal operculum and insular cortex. There is a focus of restricted diffusion within the lateral right temporal lobe as well a more remote posterior right temporal and parietal infarct is again noted. No acute hemorrhage or mass lesion is present.  Advanced atrophy and diffuse white matter changes are otherwise stable. There dilated perivascular spaces and remote lacunar infarcts within the basal ganglia bilaterally.  Flow is present in the left internal carotid artery in the vertebrobasilar system. There is no flow within the right carotid artery. Abnormal signal suggests attenuated or occluded flow within the right middle cerebral artery as well.  Patient is status post bilateral lens extractions. The globes and orbits are otherwise intact. Paranasal sinuses are clear. There is some fluid in left mastoid air cells. No obstructing at nasopharyngeal lesion is evident.  MRA HEAD FINDINGS  The right internal carotid artery is occluded. There is partial reconstitution at the level of the ophthalmic artery with some flow seen into the anterior genu of the cavernous carotid artery as well. There is markedly attenuated flow within the right M1 and A1 segments. No MCA territory branch vessels are seen. There is markedly attenuated signal within the right A2 segments. No definite  anterior communicating artery is evident. The left A1 and M1 segments are normal. The left MCA bifurcation is within normal limits. There is some attenuation of distal left MCA branch vessels. The left ACA branch vessels are normal.  The right vertebral artery is the dominant vessel. The PICA origins are visualized and normal bilaterally. The basilar artery is within normal limits. Both posterior cerebral arteries originate from the basilar tip. Asymmetric attenuation of PCA branch vessels is worse on the left.  IMPRESSION: 1. Acute nonhemorrhagic anterior right MCA territory infarct. 2. More remote posterior right MCA territory infarcts. 3. Occlusion of the right internal carotid artery with partial reconstitution in the cavernous and ophthalmic segments. 4. Diminished flow intensity in the right M1 and A1 segments with no flow signal seen beyond the right MCA bifurcation. 5. Mild distal small vessel disease in the left MCA branch vessels and left greater than right PCA branch vessels. These results were called by telephone at the time of interpretation on 01/29/2014 at 7:02 pm to Dr. Wyatt PortelaSVALDO CAMILO , who verbally acknowledged these results.   Electronically Signed   By: Gennette Pachris  Mattern M.D.   On: 01/29/2014 19:02   Mr Brain Wo Contrast  01/29/2014   CLINICAL DATA:  Left-sided weakness, slurred speech, and rightward gaze. Headaches. Stroke.  EXAM: MRI HEAD WITHOUT CONTRAST  MRA HEAD WITHOUT CONTRAST  TECHNIQUE: Multiplanar, multiecho pulse sequences of the brain and surrounding structures were obtained without intravenous contrast. Angiographic images of the head were obtained using MRA technique without contrast.  COMPARISON:  CT head from the same day.  FINDINGS: MRI HEAD FINDINGS  The diffusion-weighted images confirm an acute nonhemorrhagic anterior right MCA territory infarct involving the right frontal operculum and insular cortex. There is a focus of  restricted diffusion within the lateral right temporal  lobe as well a more remote posterior right temporal and parietal infarct is again noted. No acute hemorrhage or mass lesion is present.  Advanced atrophy and diffuse white matter changes are otherwise stable. There dilated perivascular spaces and remote lacunar infarcts within the basal ganglia bilaterally.  Flow is present in the left internal carotid artery in the vertebrobasilar system. There is no flow within the right carotid artery. Abnormal signal suggests attenuated or occluded flow within the right middle cerebral artery as well.  Patient is status post bilateral lens extractions. The globes and orbits are otherwise intact. Paranasal sinuses are clear. There is some fluid in left mastoid air cells. No obstructing at nasopharyngeal lesion is evident.  MRA HEAD FINDINGS  The right internal carotid artery is occluded. There is partial reconstitution at the level of the ophthalmic artery with some flow seen into the anterior genu of the cavernous carotid artery as well. There is markedly attenuated flow within the right M1 and A1 segments. No MCA territory branch vessels are seen. There is markedly attenuated signal within the right A2 segments. No definite anterior communicating artery is evident. The left A1 and M1 segments are normal. The left MCA bifurcation is within normal limits. There is some attenuation of distal left MCA branch vessels. The left ACA branch vessels are normal.  The right vertebral artery is the dominant vessel. The PICA origins are visualized and normal bilaterally. The basilar artery is within normal limits. Both posterior cerebral arteries originate from the basilar tip. Asymmetric attenuation of PCA branch vessels is worse on the left.  IMPRESSION: 1. Acute nonhemorrhagic anterior right MCA territory infarct. 2. More remote posterior right MCA territory infarcts. 3. Occlusion of the right internal carotid artery with partial reconstitution in the cavernous and ophthalmic segments.  4. Diminished flow intensity in the right M1 and A1 segments with no flow signal seen beyond the right MCA bifurcation. 5. Mild distal small vessel disease in the left MCA branch vessels and left greater than right PCA branch vessels. These results were called by telephone at the time of interpretation on 01/29/2014 at 7:02 pm to Dr. Wyatt Portela , who verbally acknowledged these results.   Electronically Signed   By: Gennette Pac M.D.   On: 01/29/2014 19:02    Assessment/Plan: Diagnosis: Right MCA infarct with dense left hemiparesis and visual spatial deficits 1. Does the need for close, 24 hr/day medical supervision in concert with the patient's rehab needs make it unreasonable for this patient to be served in a less intensive setting? Yes 2. Co-Morbidities requiring supervision/potential complications: cad, anemia, hx of dementia 3. Due to bladder management, bowel management, safety, skin/wound care, disease management, medication administration and patient education, does the patient require 24 hr/day rehab nursing? Yes 4. Does the patient require coordinated care of a physician, rehab nurse, PT (1-2 hrs/day, 5 days/week), OT (1-2 hrs/day, 5 days/week) and SLP (1-2 hrs/day, 5 days/week) to address physical and functional deficits in the context of the above medical diagnosis(es)? Yes Addressing deficits in the following areas: balance, endurance, locomotion, strength, transferring, bowel/bladder control, bathing, dressing, feeding, grooming, toileting, cognition, speech, language, swallowing and psychosocial support 5. Can the patient actively participate in an intensive therapy program of at least 3 hrs of therapy per day at least 5 days per week? Yes and Potentially 6. The potential for patient to make measurable gains while on inpatient rehab is good 7. Anticipated functional outcomes upon discharge  from inpatient rehab are min assist and mod assist  with PT, min assist and mod assist with  OT, min assist and mod assist with SLP. 8. Estimated rehab length of stay to reach the above functional goals is: 25-30 days 9. Does the patient have adequate social supports to accommodate these discharge functional goals? Yes and Potentially 10. Anticipated D/C setting: Home 11. Anticipated post D/C treatments: HH therapy 12. Overall Rehab/Functional Prognosis: good  RECOMMENDATIONS: This patient's condition is appropriate for continued rehabilitative care in the following setting: CIR if daughter is willing to provide for projected care needs. According to the daughter, the patient was functional at a household level prior to stroke. Patient has agreed to participate in recommended program. N/A Note that insurance prior authorization may be required for reimbursement for recommended care.  Comment: Rehab Admissions Coordinator to follow up.  Thanks,  Ranelle Oyster, MD, [place] [facility]     01/31/2014

## 2014-01-31 NOTE — Progress Notes (Signed)
Rehab Admissions Coordinator Note:  Patient was screened by Clois DupesBoyette, Jyaire Koudelka Godwin for appropriateness for an Inpatient Acute Rehab Consult per PT and OT recommendation.  At this time, we are recommending Inpatient Rehab consult. Please place order.  Clois DupesBoyette, Emiley Digiacomo Godwin 01/31/2014, 8:14 AM  I can be reached at [phone].

## 2014-01-31 NOTE — PMR Pre-admission (Signed)
PMR Admission Coordinator Pre-Admission Assessment  Patient: Sarah Daniel is an 80 y.o., female MRN: [id number] DOB: 1922-01-27 Height: 4\' 9"  (144.8 cm) Weight: 40.688 kg (89 lb 11.2 oz)              Insurance Information HMO: yes    PPO:      PCP:      IPA:      80/20:      OTHER:  PRIMARY: Blue Medicare      Policy#: [id number]      Subscriber: self CM Name: Eugenio Hoes, RN      Phone#:  [phone]     Fax#: [phone] Verbal authorization given from Cedric on 02-01-14. Updates due to Cedric on  Pre-Cert#:                                       Employer: retired Benefits:  Phone #: [phone]     Name: verified benefits online Eff. 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 right handed female with history of dementia maintained on Aricept, coronary artery disease, hypertension. By report patient lives with her daughter independent prior to admission and assistance as needed. Presented 01/29/2014 after being found down by her daughter with left-sided weakness and facial droop. Patient was a poor medical historian. MRI/MRA of the brain shows acute nonhemorrhagic anterior right MCA territory infarct as well as occlusion of the right internal carotid artery with partial reconstitution of the cavernous and ophthalmic segments.  CT angiogram of the neck again showing right internal carotid artery occlusion as well as proximal left subclavian artery occlusion less than 10 mm from the aortic arch. Patient did not receive TPA. Echocardiogram with ejection fraction of 60% grade 1 diastolic dysfunction. EEG is pending. Neurology service is consulted maintained on aspirin for CVA prophylaxis as well as subcutaneous Lovenox for DVT prophylaxis. Swallow evaluation per speech therapy 01/30/2014 patient currently nothing by mouth with question plan nasogastric tube versus PEG tube. Physical occupational therapy evaluations completed with recommendations of physical medicine rehabilitation consult.  NIH Total: 15  Past Medical History  Past Medical History  Diagnosis Date  . Dementia   . Hypertension   . GERD (gastroesophageal reflux disease)   . Thyroid disease   . PUD (peptic ulcer disease)   . Coronary artery disease   . Hypothyroidism   . Arthritis     Family History  family history is not on file.  Prior Rehab/Hospitalizations: none   Current Medications  Current facility-administered medications:0.9 %  sodium chloride infusion, , Intravenous, Continuous, Ripudeep K Rai, MD, Last Rate: 60 mL/hr at 01/31/14 2009;  antiseptic oral rinse (CPC / CETYLPYRIDINIUM  CHLORIDE 0.05%) solution 7 mL, 7 mL, Mouth Rinse, q12n4p, Zannie CovePreetha Joseph, MD, 7 mL at 01/31/14 1653;  aspirin suppository 300 mg, 300 mg, Rectal, Daily, Zannie CovePreetha Joseph, MD, 300 mg at 01/30/14 [id number] aspirin tablet 325 mg, 325 mg, Oral, Daily, Zannie CovePreetha Joseph, MD, 325 mg at 01/31/14 1050;  chlorhexidine (PERIDEX) 0.12 % solution 15 mL, 15 mL, Mouth Rinse, BID, Zannie CovePreetha Joseph, MD, 15 mL at 01/31/14 2009;  donepezil (ARICEPT) tablet 10 mg, 10 mg, Oral, QHS, Zannie CovePreetha Joseph, MD, 10 mg at 01/31/14 2259;  enoxaparin (LOVENOX) injection 30 mg, 30 mg, Subcutaneous, Q24H, Zannie CovePreetha Joseph, MD, 30 mg at 01/31/14 2009 feeding supplement (JEVITY 1.2 CAL) liquid 1,000 mL, 1,000 mL, Per  Tube, Continuous, Reanne Vista LawmanJ Barnett, RD;  levothyroxine (SYNTHROID, LEVOTHROID) tablet 50 mcg, 50 mcg, Oral, QAC breakfast, Zannie CovePreetha Joseph, MD, 50 mcg at 01/31/14 0847;  morphine 2 MG/ML injection 1-2 mg, 1-2 mg, Intravenous, Q4H PRN, Ripudeep K Rai, MD;  pantoprazole (PROTONIX) injection 40 mg, 40 mg, Intravenous, Q24H, Ripudeep K Rai, MD, 40 mg at 01/31/14 1050 senna-docusate (Senokot-S) tablet 1 tablet, 1 tablet, Oral, QHS PRN, Zannie CovePreetha Joseph, MD;  traMADol Janean Sark(ULTRAM) tablet 50 mg, 50 mg, Oral, Q6H PRN, Ripudeep K Rai, MD, 50 mg at 02/01/14 0510;  venlafaxine XR (EFFEXOR-XR) 24 hr capsule 75 mg, 75 mg, Oral, Q breakfast, Zannie CovePreetha Joseph, MD, 75 mg at 01/31/14 0847  Patients Current Diet: NPO, having [name] placed on 02-01-14 in am.  Precautions / Restrictions Precautions Precautions: Fall Restrictions Weight Bearing Restrictions: No Other Position/Activity Restrictions: monitor LUE due to weakness and inattention   Prior Activity Level Limited Community (1-2x/wk): Pt got out on twice a week for [facility] and enjoyed "going to her job". Dtr states they just bumped her up to three days/week for this program. Dtr would drive her mom on occasional errands as well. Pt was independent with all mobility (no AD used) and ADL's.  Home Assistive Devices / Equipment Home Assistive Devices/Equipment: Eyeglasses;Dentures (specify type) (upper denture) Home Equipment: Cane - single point  Prior Functional Level Prior Function Level of Independence: Independent Comments: pt. overall very independent, at home up to 4 hours without supervision  Current Functional Level Cognition  Arousal/Alertness: Lethargic Overall Cognitive Status: Impaired/Different from baseline Difficult to assess due to: Impaired communication;Level of arousal Current Attention Level: Sustained Orientation Level: Oriented to person;Oriented to place;Oriented to situation;Disoriented to time Following Commands:  Follows one step commands inconsistently Safety/Judgement: Decreased awareness of safety;Decreased awareness of deficits Attention: Focused Focused Attention: Appears intact Memory: Impaired Memory Impairment: Storage deficit (not tested, suspect impairment due to low level attention) Awareness: Impaired Awareness Impairment: Intellectual impairment;Emergent impairment Problem Solving: Impaired Problem Solving Impairment: Verbal basic Executive Function: Initiating;Self Monitoring Initiating: Impaired Initiating Impairment: Verbal basic;Functional basic Self Monitoring: Impaired Self Monitoring Impairment: Verbal basic;Functional basic    Extremity Assessment (includes Sensation/Coordination)          ADLs  Overall ADL's : Needs assistance/impaired Grooming: Brushing hair;Moderate assistance (applying lotion) Upper Body Bathing: Sitting;Moderate assistance Lower Body Bathing: Maximal assistance;Sit to/from stand Upper Body Dressing : Sitting;Maximal assistance Lower Body Dressing: Maximal assistance;Sit to/from stand Toilet Transfer: Stand-pivot;BSC;Maximal assistance Toileting- Clothing Manipulation and Hygiene: Bed level;Maximal assistance;Sitting/lateral lean Functional mobility during ADLs: Maximal assistance (stand pivot) General ADL Comments: Recommended family sit on left side to try to get pt to look to left. Pt incontinent of stool when OT arrived. Assisted in cleaning pt. Discussed with family member safety/positioning of LUE and to have it  elevated on pillow. Discussed CIR with family member. Pt with decreased balance sitting EOB-leaning back at times.    Mobility  Overal bed mobility: Needs Assistance Bed Mobility: Rolling;Sidelying to Sit Rolling: Min assist Sidelying to sit: Min assist (with rail) Supine to sit: Mod assist Sit to supine: Mod assist Sit to sidelying: Mod assist General bed mobility comments: min assist to find LUE with Rt and bring across body;  pt then used RUE and rail to roll and come to sit; min assist for LLe guidance over EOB (inattention)    Transfers  Overall transfer level: Needs assistance Equipment used: 1 person hand held assist Transfers: Sit to/from UGI CorporationStand;Stand Pivot Transfers Sit to Stand: Mod assist Stand pivot transfers: Mod assist General transfer comment: assist to obtain proper position pror to stand; pt with lean to her Left; good weightbearing through LLE    Ambulation / Gait / Stairs / Wheelchair Mobility  Ambulation/Gait Ambulation/Gait assistance: Mod assist Ambulation Distance (Feet): 40 Feet Assistive device:  (RUE pushing IV pole) Gait Pattern/deviations: Step-to pattern;Decreased step length - right;Decreased stance time - left;Decreased weight shift to right;Steppage;Wide base of support (Lt knee hyperextension) Gait velocity interpretation: <1.8 ft/sec, indicative of risk for recurrent falls General Gait Details: bearing weight on LLE without buckling, however required assist to prevent knee hyperextension and occasional placement assist at pelvis and ribs for weightshifting/balance; pt easily distracted and with much difficulty turning to her Left    Posture / Balance Dynamic Sitting Balance Sitting balance - Comments: Lt lean, could correct to midline without assist at beginning of session; by the end, was fatigued and could initiate shift to midline and required assist to achieve    Special needs/care consideration BiPAP/CPAP no CPM no  Continuous Drip IV no  Dialysis no         Life Vest no  Oxygen no  Special Bed no  Trach Size no  Wound Vac (area) no       Skin no issues                               Bowel mgmt: last BM on 01-30-14, incontinence noted Bladder mgmt: incontinent Diabetic mgmt - no, but dtr states pt does have low blood sugars at times   Previous Home Environment Living Arrangements: Children (daughter)  Lives With: Daughter Available Help at Discharge:  Family;Available PRN/intermittently Type of Home: House Home Layout: One level;Able to live on main level with bedroom/bathroom Home Access: Level entry Bathroom Shower/Tub: Walk-in shower Home Care Services: No Additional Comments: walk-in shower  Discharge Living Setting Plans for Discharge Living Setting: Lives with (comment) (lives with dtr and son-in-law) Type of Home at Discharge: House Discharge Home Layout: One level Discharge Home Access: Level entry Does the patient have any problems obtaining your medications?: No  Social/Family/Support Systems Patient Roles: Other (Comment) (goes to [facility] a [facility] - adult day care program) Contact Information: dtr Geraldine ContrasDee is primary contact Anticipated Caregiver: Geraldine Contrasee and son-in-law Anticipated Industrial/product designerCaregiver's Contact Information: see above Ability/Limitations of Caregiver: dtr is retired but does help in her grandchildren's school. Son-in-law is also retired and could assist pt. Dtr also stated they may look into hiring caregivers. Caregiver Availability: Other (Comment) (will be available the majority of the time and son-in-law can also help) Discharge Plan Discussed with Primary Caregiver: Yes Is Caregiver In Agreement with Plan?: Yes Does Caregiver/Family have Issues with Lodging/Transportation while Pt is in Rehab?:  No  Goals/Additional Needs Patient/Family Goal for Rehab: Minimal to moderate assistance with PT, OT and SLP Expected length of stay: 25-30 days Cultural Considerations: pt is [employer] Dietary Needs: currently NPO Equipment Needs: to be determined Pt/Family Agrees to Admission and willing to participate: Yes (spoke with dtr. Dtr spoke with her sister as well.) Program Orientation Provided & Reviewed with Pt/Caregiver Including Roles  & Responsibilities: Yes   Decrease burden of Care through IP rehab admission: NA   Possible need for SNF placement upon discharge: possible, depends on pt's overall progress and  amount of needed assist at the end of a rehab stay. Extensive discussion was had with pt's dtr explaining that rehab MD has projected that pt will need minimal to moderate assistance with PT, OT and SLP at the end of inpatient rehab. Dtr stated, "we will do what it takes to help her and cross that bridge when it comes."  Patient Condition: This patient's condition remains as documented in the consult dated 02-01-14, in which the Rehabilitation Physician determined and documented that the patient's condition is appropriate for intensive rehabilitative care in an inpatient rehabilitation facility. Will admit to inpatient rehab today.  Preadmission Screen Completed By:  Juliann Mule, PT, 02/01/2014 11:13 AM ______________________________________________________________________   Discussed status with Dr. Riley Kill on 02/01/14 at 1106 and received telephone approval for admission today.  Admission Coordinator: Juliann Mule, PT, time 1106/Date 02/01/14

## 2014-01-31 NOTE — Progress Notes (Signed)
Several attempts made to insert NG tube without success. Had another nurse with me. MD notified and request made for IR to insert. Awaiting MD's orders.

## 2014-01-31 NOTE — Evaluation (Signed)
Clinical/Bedside Swallow Evaluation Patient Details  Name: [place]y [place] MRN: [id number] Date of Birth: 30-May-1921  Today's Date: 01/31/2014 Time: [id number] SLP Time Calculation (min): 29 min  Past Medical History:  Past Medical History  Diagnosis Date  . Dementia   . Hypertension   . GERD (gastroesophageal reflux disease)   . Thyroid disease   . PUD (peptic ulcer disease)   . Coronary artery disease   . Hypothyroidism   . Arthritis    Past Surgical History:  Past Surgical History  Procedure Laterality Date  . Abdominal hysterectomy     HPI:  80 y.o. female with PMH of CAD, HTN, mild dementia, hypothyroidism was brought to the ER by EMS after her daughter found her down beside her bed - she had a facial droop and L hemiparesis, L neglect.  MRI confirmed acute nonhemorrhagic anterior right MCA territory infarct, remote posterior right MCA territory infarct.  Failed RN stroke swallow screen.     Assessment / Plan / Recommendation Clinical Impression  Pt demonstrates cognitive deficits in the areas of attention, awareness, initiation, and social interaction. During assessment SLP provided maximum verbal, visual, and tactile cues for sustained attention to basic functional and verbal tasks. Pt required maximum  cueing to initiate verbal responses, but shows no expressive or receptive deficits when completing tasks. She was inconsistently able to follow 1-3 step commands and was able to name objects in the room. Pt shows signs of a bucco-facial apraxia and is unable to follow oral motor commands. Her speech is dysarthric and her intelligibility is reduced to 75% at the word level, and 50% at phrase and sentence level. She demonstrates intellectual and emergent awareness deficits, making no attempt to repair communication breakdowns. Recommend continued speech therapy services to focus on improvement of cognitive and speech deficits. SLP will continue to follow.    Aspiration Risk        Diet Recommendation     Medication Administration: Via alternative means    Other  Recommendations Oral Care Recommendations: Oral care BID   Follow Up Recommendations  Inpatient Rehab    Frequency and Duration min 2x/week      Pertinent Vitals/Pain none    SLP Swallow Goals     Swallow Study Prior Functional Status  Cognitive/Linguistic Baseline: Within functional limits Type of Home: House  Lives With: Daughter Available Help at Discharge: Family;Available PRN/intermittently    General HPI: 80 y.o. female with PMH of CAD, HTN, mild dementia, hypothyroidism was brought to the ER by EMS after her daughter found her down beside her bed - she had a facial droop and L hemiparesis, L neglect.  MRI confirmed acute nonhemorrhagic anterior right MCA territory infarct, remote posterior right MCA territory infarct.  Failed RN stroke swallow screen.   Temperature Spikes Noted: No Respiratory Status: Room air Behavior/Cognition: Distractible;Requires cueing;Decreased sustained attention Oral Cavity - Dentition: Dentures, top;Dentures, bottom Patient Positioning: Upright in chair    Oral/Motor/Sensory Function Overall Oral Motor/Sensory Function: Impaired Labial Strength: Reduced Labial Sensation: Reduced Lingual Strength: Reduced Facial Strength: Reduced   Ice Chips     Thin Liquid      Nectar Thick     Honey Thick     Puree     Solid   GO            Barkley Bruns'Brien, Katherine 01/31/2014,1:50 PM

## 2014-01-31 NOTE — Progress Notes (Signed)
Patient ID: Sarah Daniel  female  UJW:[id number]    DOB: 1921/11/01    DOA: 01/29/2014  PCP: Lillia Mountain, MD  Brief history of present illness  Sarah Daniel is a 80 y.o. female with PMH of CAD, HTN, Dementia, hypothyroidism was brought to the ER by EMS today after her daughter found her down this morning beside her bed, when she had a facial droop and L hemiparesis.  Patient is a very poor historian.Last seen normal on 10/23, in ED noted to have L hemiparesis, L neglect and facial droop. CT with R MCA stroke  Assessment/Plan: Principal Problem: 1. L hemiparesis/L hemi neglect and facial droop /acute CVA - CT head showed acute/subacute right MCA distribution infarct in the right frontoparietal region - MRI confirmed acute nonhemorrhagic anterior right MCA territory infarct, remote posterior right MCA territory infarct - MRA showed occlusion of right internal carotid artery with partial reconstitution in the cavernous and ophthalmic segments, diminished flow intensity in the right M1 and A1 segments, mild to distal small vessel disease and left MCA branches - ECHO 55% to 60%,  - carotid duplex pending - Lipid panel showed LDL of 61, hbAic 5.3 - PT/OT- recommending CIR - ASA 300mg  PR daily since failed swallow testing, on ASA 81 and plavix 75mg  at home prior to admission   Active problems:  2. Dementia  -mild per daughter, resume aricept pending speech eval   3. Coronary artery disease  -with h/o PCI and stents in 2013 per Dtr in [place]  -hold anti-platelet agents pending swallow screen and neuro recs   4. Hypothyroidism  -resume synthroid when he passes swallow evaluation  DVT Prophylaxis: DO NOT RESUSCITATE  Code Status: Lovenox  Family Communication: Discussed in detail with patient's daughter at the bedside  Disposition:  Consultants:  Neurology  Inpatient rehabilitation  Procedures:  CT head, MRI head,  MRA  Antibiotics:  None    Subjective: Patient seen and examined, oriented to self left-sided neglect failed swallow evaluation, daughter at the bedside    Objective: Weight change:  No intake or output data in the 24 hours ending 01/31/14 1125 Blood pressure 158/64, pulse 76, temperature 98.1 F (36.7 C), temperature source Axillary, resp. rate 18, height 4\' 9"  (1.448 m), weight 42.185 kg (93 lb), SpO2 99.00%.  Physical Exam: General: Alert and awake, oriented to self CVS: S1-S2 clear, no murmur rubs or gallops Chest: clear to auscultation bilaterally anteriorly Abdomen: soft nontender, nondistended, normal bowel sounds  Extremities: no cyanosis, clubbing or edema  Neuro: Left-sided hemiparesis  Lab Results: Basic Metabolic Panel:  Recent Labs Lab 01/29/14 1542 01/29/14 1555  NA 138 139  K 4.3 4.1  CL 101 104  CO2 24  --   GLUCOSE 154* 152*  BUN 23 23  CREATININE 0.90 0.90  CALCIUM 9.4  --    Liver Function Tests:  Recent Labs Lab 01/29/14 1542  AST 26  ALT 29  ALKPHOS 83  BILITOT 0.3  PROT 7.0  ALBUMIN 3.6   No results found for this basename: LIPASE, AMYLASE,  in the last 168 hours No results found for this basename: AMMONIA,  in the last 168 hours CBC:  Recent Labs Lab 01/29/14 1542 01/29/14 1555  WBC 7.4  --   NEUTROABS 6.4  --   HGB 10.8* 12.9  HCT 34.1* 38.0  MCV 92.2  --   PLT 301  --    Cardiac Enzymes: No results found for this basename: CKTOTAL, CKMB, CKMBINDEX, TROPONINI,  in  the last 168 hours BNP: No components found with this basename: POCBNP,  CBG: No results found for this basename: GLUCAP,  in the last 168 hours   Micro Results: No results found for this or any previous visit (from the past 240 hour(s)).  Studies/Results: Dg Chest 1 View  01/29/2014   CLINICAL DATA:  Left-sided weakness, slurred speech. History of hypertension. Found on floor.  EXAM: CHEST - 1 VIEW  COMPARISON:  03/08/2013  FINDINGS: Heart is normal  size. No confluent airspace opacities or effusions. Calcifications in the aortic arch. No visible aneurysm. No acute bony abnormality.  IMPRESSION: No active disease.   Electronically Signed   By: Charlett NoseKevin  Dover M.D.   On: 01/29/2014 17:03   Ct Head Wo Contrast  01/29/2014   CLINICAL DATA:  Stroke symptoms, left-sided weakness, slurred speech.  EXAM: CT HEAD WITHOUT CONTRAST  TECHNIQUE: Contiguous axial images were obtained from the base of the skull through the vertex without intravenous contrast.  COMPARISON:  None.  FINDINGS: Cortical hypodensity in the right frontoparietal region (series 201/ image 16), suspicious for acute/subacute right MCA distribution infarct.  No evidence of parenchymal hemorrhage or extra-axial fluid collection.  No mass lesion, mass effect, or midline shift.  Subcortical white matter and periventricular small vessel ischemic changes. Intracranial atherosclerosis.  Global cortical atrophy.  No ventriculomegaly.  The visualized paranasal sinuses are essentially clear. The mastoid air cells are unopacified.  No evidence of calvarial fracture.  IMPRESSION: Suspected acute/subacute right MCA distribution infarct.  These results were called by telephone at the time of interpretation on 01/29/2014 at 4:49 pm to Dr. Lynelle DoctorKnapp, who verbally acknowledged these results.   Electronically Signed   By: Charline BillsSriyesh  Krishnan M.D.   On: 01/29/2014 16:49   Mr Maxine GlennMra Head Wo Contrast  01/29/2014   CLINICAL DATA:  Left-sided weakness, slurred speech, and rightward gaze. Headaches. Stroke.  EXAM: MRI HEAD WITHOUT CONTRAST  MRA HEAD WITHOUT CONTRAST  TECHNIQUE: Multiplanar, multiecho pulse sequences of the brain and surrounding structures were obtained without intravenous contrast. Angiographic images of the head were obtained using MRA technique without contrast.  COMPARISON:  CT head from the same day.  FINDINGS: MRI HEAD FINDINGS  The diffusion-weighted images confirm an acute nonhemorrhagic anterior right MCA  territory infarct involving the right frontal operculum and insular cortex. There is a focus of restricted diffusion within the lateral right temporal lobe as well a more remote posterior right temporal and parietal infarct is again noted. No acute hemorrhage or mass lesion is present.  Advanced atrophy and diffuse white matter changes are otherwise stable. There dilated perivascular spaces and remote lacunar infarcts within the basal ganglia bilaterally.  Flow is present in the left internal carotid artery in the vertebrobasilar system. There is no flow within the right carotid artery. Abnormal signal suggests attenuated or occluded flow within the right middle cerebral artery as well.  Patient is status post bilateral lens extractions. The globes and orbits are otherwise intact. Paranasal sinuses are clear. There is some fluid in left mastoid air cells. No obstructing at nasopharyngeal lesion is evident.  MRA HEAD FINDINGS  The right internal carotid artery is occluded. There is partial reconstitution at the level of the ophthalmic artery with some flow seen into the anterior genu of the cavernous carotid artery as well. There is markedly attenuated flow within the right M1 and A1 segments. No MCA territory branch vessels are seen. There is markedly attenuated signal within the right A2 segments. No definite anterior  communicating artery is evident. The left A1 and M1 segments are normal. The left MCA bifurcation is within normal limits. There is some attenuation of distal left MCA branch vessels. The left ACA branch vessels are normal.  The right vertebral artery is the dominant vessel. The PICA origins are visualized and normal bilaterally. The basilar artery is within normal limits. Both posterior cerebral arteries originate from the basilar tip. Asymmetric attenuation of PCA branch vessels is worse on the left.  IMPRESSION: 1. Acute nonhemorrhagic anterior right MCA territory infarct. 2. More remote posterior  right MCA territory infarcts. 3. Occlusion of the right internal carotid artery with partial reconstitution in the cavernous and ophthalmic segments. 4. Diminished flow intensity in the right M1 and A1 segments with no flow signal seen beyond the right MCA bifurcation. 5. Mild distal small vessel disease in the left MCA branch vessels and left greater than right PCA branch vessels. These results were called by telephone at the time of interpretation on 01/29/2014 at 7:02 pm to Dr. Wyatt PortelaSVALDO CAMILO , who verbally acknowledged these results.   Electronically Signed   By: Gennette Pachris  Mattern M.D.   On: 01/29/2014 19:02   Mr Brain Wo Contrast  01/29/2014   CLINICAL DATA:  Left-sided weakness, slurred speech, and rightward gaze. Headaches. Stroke.  EXAM: MRI HEAD WITHOUT CONTRAST  MRA HEAD WITHOUT CONTRAST  TECHNIQUE: Multiplanar, multiecho pulse sequences of the brain and surrounding structures were obtained without intravenous contrast. Angiographic images of the head were obtained using MRA technique without contrast.  COMPARISON:  CT head from the same day.  FINDINGS: MRI HEAD FINDINGS  The diffusion-weighted images confirm an acute nonhemorrhagic anterior right MCA territory infarct involving the right frontal operculum and insular cortex. There is a focus of restricted diffusion within the lateral right temporal lobe as well a more remote posterior right temporal and parietal infarct is again noted. No acute hemorrhage or mass lesion is present.  Advanced atrophy and diffuse white matter changes are otherwise stable. There dilated perivascular spaces and remote lacunar infarcts within the basal ganglia bilaterally.  Flow is present in the left internal carotid artery in the vertebrobasilar system. There is no flow within the right carotid artery. Abnormal signal suggests attenuated or occluded flow within the right middle cerebral artery as well.  Patient is status post bilateral lens extractions. The globes and  orbits are otherwise intact. Paranasal sinuses are clear. There is some fluid in left mastoid air cells. No obstructing at nasopharyngeal lesion is evident.  MRA HEAD FINDINGS  The right internal carotid artery is occluded. There is partial reconstitution at the level of the ophthalmic artery with some flow seen into the anterior genu of the cavernous carotid artery as well. There is markedly attenuated flow within the right M1 and A1 segments. No MCA territory branch vessels are seen. There is markedly attenuated signal within the right A2 segments. No definite anterior communicating artery is evident. The left A1 and M1 segments are normal. The left MCA bifurcation is within normal limits. There is some attenuation of distal left MCA branch vessels. The left ACA branch vessels are normal.  The right vertebral artery is the dominant vessel. The PICA origins are visualized and normal bilaterally. The basilar artery is within normal limits. Both posterior cerebral arteries originate from the basilar tip. Asymmetric attenuation of PCA branch vessels is worse on the left.  IMPRESSION: 1. Acute nonhemorrhagic anterior right MCA territory infarct. 2. More remote posterior right MCA territory infarcts. 3.  Occlusion of the right internal carotid artery with partial reconstitution in the cavernous and ophthalmic segments. 4. Diminished flow intensity in the right M1 and A1 segments with no flow signal seen beyond the right MCA bifurcation. 5. Mild distal small vessel disease in the left MCA branch vessels and left greater than right PCA branch vessels. These results were called by telephone at the time of interpretation on 01/29/2014 at 7:02 pm to Dr. Wyatt Portela , who verbally acknowledged these results.   Electronically Signed   By: Gennette Pac M.D.   On: 01/29/2014 19:02    Medications: Scheduled Meds: . antiseptic oral rinse  7 mL Mouth Rinse q12n4p  . aspirin  300 mg Rectal Daily   Or  . aspirin  325 mg  Oral Daily  . chlorhexidine  15 mL Mouth Rinse BID  . donepezil  10 mg Oral QHS  . enoxaparin (LOVENOX) injection  30 mg Subcutaneous Q24H  . levothyroxine  50 mcg Oral QAC breakfast  . pantoprazole (PROTONIX) IV  40 mg Intravenous Q24H  . venlafaxine XR  75 mg Oral Q breakfast      LOS: 2 days   Quintyn Dombek M.D. [facility] 01/31/2014, 11:25 AM Pager: [phone]  If 7PM-7AM, please contact night-coverage [email or web address] Password [email or web address]

## 2014-01-31 NOTE — Clinical Social Work Note (Addendum)
Clinical Social Worker spoke with patient's daughter in reference to possible SNF backup for CIR. Pt's daughter agreeable to SNF backup if CIR was unable to accept. Will follow up to determine disposition in the am.   Derenda FennelBashira Meliya Mcconahy, MSW, LCSWA [phone] 01/31/2014 4:39 PM

## 2014-01-31 NOTE — Progress Notes (Signed)
Rehab admissions - I met with pt and her daughter in follow up to rehab MD consult to explain the possibility of inpatient rehab. Questions were answered and informational brochures were given. I explained that our rehab MD would consider possible inpatient rehab admit pending family's ability "to provide for projected care needs." I explained that our rehab MD is projecting that pt will likely need minimal to moderate assistance at the end of a possible rehab stay. Dtr was not comfortable in the possibility of giving moderate assistance and stated she could give minimal help to supervision only. "I'm not comfortable helping her at this level."   Further discussion was had involving the differences between skilled nursing and inpatient rehab. Dtr is to tour [facility] later today and will get back to me about family's preference for discharge.   I will wait to hear back from dtr. Pt does have Blue Medicare and we would need insurance authorization to consider inpatient rehab.  Please call me with any questions. Thanks.  Nanetta Batty, PT Rehabilitation Admissions Coordinator [phone]

## 2014-01-31 NOTE — Progress Notes (Signed)
INITIAL NUTRITION ASSESSMENT  DOCUMENTATION CODES Per approved criteria  -Not Applicable   INTERVENTION: As soon as NG tube is placed, Initiate Jevity 1.2 @ 20 ml/hr via NG tube and increase by 10 ml every 4 hours to goal rate of 45 ml/hr.   Tube feeding regimen will provide 1296 kcal (104% of needs), 60 grams of protein, and 875 ml of H2O.   When IV fluids are discontinued, add 100 ml free water flushes every 6 hours (4 times daily) to provide a total of 1275 ml daily.    NUTRITION DIAGNOSIS: Inadequate oral intake related to inability to eat as evidenced by NPO status.   Goal: Pt to meet >/= 90% of their estimated nutrition needs   Monitor:  Diet advancement, PO intake, weight trend, labs  Reason for Assessment: Consult for tube feeding initiation and management  80 y.o. female  Admitting Dx: Stroke  ASSESSMENT: [age] y.o. female with PMH of CAD, HTN, Dementia, hypothyroidism was brought to the ER by EMS today after her daughter found her down beside her bed, when she had a facial droop and L hemiparesis. Swallow evaluation per speech therapy 01/30/2014 patient currently nothing by mouth with question plan nasogastric tube versus PEG tube.  SLP re-evaluated pt today and continues to recommend NPO. Per pt's daughter at bedside patient has a poor appetite and doesn't ask for food but, when food is put in front of her she usually eats well with 2-3 meals daily. Daughter reports that patient has slowly been losing weight- about 5 lbs in the past year.   Nutrition Focused Physical Exam:  Subcutaneous Fat:  Orbital Region: wnl Upper Arm Region: mild wasting Thoracic and Lumbar Region: NA  Muscle:  Temple Region: mild wasting Clavicle Bone Region: moderate wasting Clavicle and Acromion Bone Region: moderate wasting Scapular Bone Region: NA Dorsal Hand: moderate wasting Patellar Region: mild wasting Anterior Thigh Region: moderate wasting Posterior Calf Region: wnl  Edema:  none noted   Height: Ht Readings from Last 1 Encounters:  01/30/14 4\' 9"  (1.448 m)    Weight: Wt Readings from Last 1 Encounters:  01/30/14 93 lb (42.185 kg)    Ideal Body Weight: 95 lbs  % Ideal Body Weight: 98%  Wt Readings from Last 10 Encounters:  01/30/14 93 lb (42.185 kg)  03/09/13 100 lb 12 oz (45.7 kg)    Usual Body Weight: 98 lbs  % Usual Body Weight: 95%  BMI:  Body mass index is 20.12 kg/(m^2).  Estimated Nutritional Needs: Kcal: 1100-1250 Protein: 50-60 grams Fluid: 1.3 L/day  Skin: intact  Diet Order: NPO  EDUCATION NEEDS: -No education needs identified at this time  No intake or output data in the 24 hours ending 01/31/14 1149  Last BM: 10/25   Labs:   Recent Labs Lab 01/29/14 1542 01/29/14 1555  NA 138 139  K 4.3 4.1  CL 101 104  CO2 24  --   BUN 23 23  CREATININE 0.90 0.90  CALCIUM 9.4  --   GLUCOSE 154* 152*    CBG (last 3)  No results found for this basename: GLUCAP,  in the last 72 hours  Scheduled Meds: . antiseptic oral rinse  7 mL Mouth Rinse q12n4p  . aspirin  300 mg Rectal Daily   Or  . aspirin  325 mg Oral Daily  . chlorhexidine  15 mL Mouth Rinse BID  . donepezil  10 mg Oral QHS  . enoxaparin (LOVENOX) injection  30 mg Subcutaneous Q24H  .  levothyroxine  50 mcg Oral QAC breakfast  . pantoprazole (PROTONIX) IV  40 mg Intravenous Q24H  . venlafaxine XR  75 mg Oral Q breakfast    Continuous Infusions: . sodium chloride 60 mL/hr at 01/31/14 0358    Past Medical History  Diagnosis Date  . Dementia   . Hypertension   . GERD (gastroesophageal reflux disease)   . Thyroid disease   . PUD (peptic ulcer disease)   . Coronary artery disease   . Hypothyroidism   . Arthritis     Past Surgical History  Procedure Laterality Date  . Abdominal hysterectomy      Ian Malkineanne Barnett RD, LDN Inpatient Clinical Dietitian Pager: [phone] After Hours Pager: [phone]

## 2014-02-01 ENCOUNTER — Inpatient Hospital Stay (HOSPITAL_COMMUNITY)
Admission: RE | Admit: 2014-02-01 | Discharge: 2014-02-15 | DRG: 057 | Disposition: A | Discharge status: Hospice | Payer: Medicare Other | Source: Intra-hospital | Attending: Physical Medicine & Rehabilitation | Admitting: Physical Medicine & Rehabilitation

## 2014-02-01 ENCOUNTER — Inpatient Hospital Stay (HOSPITAL_COMMUNITY): Payer: Medicare Other

## 2014-02-01 DIAGNOSIS — M6281 Muscle weakness (generalized): Secondary | ICD-10-CM | POA: Diagnosis present

## 2014-02-01 DIAGNOSIS — I63511 Cerebral infarction due to unspecified occlusion or stenosis of right middle cerebral artery: Secondary | ICD-10-CM | POA: Diagnosis not present

## 2014-02-01 DIAGNOSIS — N183 Chronic kidney disease, stage 3 unspecified: Secondary | ICD-10-CM

## 2014-02-01 DIAGNOSIS — E876 Hypokalemia: Secondary | ICD-10-CM | POA: Diagnosis present

## 2014-02-01 DIAGNOSIS — I69391 Dysphagia following cerebral infarction: Secondary | ICD-10-CM | POA: Diagnosis not present

## 2014-02-01 DIAGNOSIS — I748 Embolism and thrombosis of other arteries: Secondary | ICD-10-CM | POA: Diagnosis present

## 2014-02-01 DIAGNOSIS — I69392 Facial weakness following cerebral infarction: Secondary | ICD-10-CM

## 2014-02-01 DIAGNOSIS — H538 Other visual disturbances: Secondary | ICD-10-CM | POA: Diagnosis present

## 2014-02-01 DIAGNOSIS — I2510 Atherosclerotic heart disease of native coronary artery without angina pectoris: Secondary | ICD-10-CM | POA: Diagnosis present

## 2014-02-01 DIAGNOSIS — Z789 Other specified health status: Secondary | ICD-10-CM

## 2014-02-01 DIAGNOSIS — E039 Hypothyroidism, unspecified: Secondary | ICD-10-CM

## 2014-02-01 DIAGNOSIS — F329 Major depressive disorder, single episode, unspecified: Secondary | ICD-10-CM | POA: Diagnosis present

## 2014-02-01 DIAGNOSIS — I639 Cerebral infarction, unspecified: Secondary | ICD-10-CM | POA: Diagnosis present

## 2014-02-01 DIAGNOSIS — Z66 Do not resuscitate: Secondary | ICD-10-CM | POA: Diagnosis present

## 2014-02-01 DIAGNOSIS — R1310 Dysphagia, unspecified: Secondary | ICD-10-CM | POA: Diagnosis present

## 2014-02-01 DIAGNOSIS — Z87891 Personal history of nicotine dependence: Secondary | ICD-10-CM | POA: Diagnosis not present

## 2014-02-01 DIAGNOSIS — I6521 Occlusion and stenosis of right carotid artery: Secondary | ICD-10-CM | POA: Diagnosis present

## 2014-02-01 DIAGNOSIS — I69398 Other sequelae of cerebral infarction: Secondary | ICD-10-CM

## 2014-02-01 DIAGNOSIS — Z515 Encounter for palliative care: Secondary | ICD-10-CM

## 2014-02-01 DIAGNOSIS — R414 Neurologic neglect syndrome: Secondary | ICD-10-CM | POA: Diagnosis not present

## 2014-02-01 DIAGNOSIS — F0390 Unspecified dementia without behavioral disturbance: Secondary | ICD-10-CM | POA: Diagnosis present

## 2014-02-01 DIAGNOSIS — R269 Unspecified abnormalities of gait and mobility: Secondary | ICD-10-CM | POA: Diagnosis present

## 2014-02-01 DIAGNOSIS — K219 Gastro-esophageal reflux disease without esophagitis: Secondary | ICD-10-CM | POA: Diagnosis present

## 2014-02-01 DIAGNOSIS — I6309 Cerebral infarction due to thrombosis of other precerebral artery: Secondary | ICD-10-CM

## 2014-02-01 DIAGNOSIS — G8110 Spastic hemiplegia affecting unspecified side: Secondary | ICD-10-CM | POA: Diagnosis not present

## 2014-02-01 DIAGNOSIS — Z4659 Encounter for fitting and adjustment of other gastrointestinal appliance and device: Secondary | ICD-10-CM | POA: Insufficient documentation

## 2014-02-01 DIAGNOSIS — I129 Hypertensive chronic kidney disease with stage 1 through stage 4 chronic kidney disease, or unspecified chronic kidney disease: Secondary | ICD-10-CM | POA: Diagnosis present

## 2014-02-01 DIAGNOSIS — N189 Chronic kidney disease, unspecified: Secondary | ICD-10-CM | POA: Diagnosis present

## 2014-02-01 DIAGNOSIS — I10 Essential (primary) hypertension: Secondary | ICD-10-CM

## 2014-02-01 DIAGNOSIS — R251 Tremor, unspecified: Secondary | ICD-10-CM

## 2014-02-01 DIAGNOSIS — I69354 Hemiplegia and hemiparesis following cerebral infarction affecting left non-dominant side: Principal | ICD-10-CM

## 2014-02-01 LAB — GLUCOSE, CAPILLARY
GLUCOSE-CAPILLARY: 95 mg/dL (ref 70–99)
GLUCOSE-CAPILLARY: 95 mg/dL (ref 70–99)
GLUCOSE-CAPILLARY: 97 mg/dL (ref 70–99)
Glucose-Capillary: 106 mg/dL — ABNORMAL HIGH (ref 70–99)
Glucose-Capillary: 108 mg/dL — ABNORMAL HIGH (ref 70–99)
Glucose-Capillary: 89 mg/dL (ref 70–99)

## 2014-02-01 LAB — CBC
HCT: 36.1 % (ref 36.0–46.0)
Hemoglobin: 11.6 g/dL — ABNORMAL LOW (ref 12.0–15.0)
MCH: 29.3 pg (ref 26.0–34.0)
MCHC: 32.1 g/dL (ref 30.0–36.0)
MCV: 91.2 fL (ref 78.0–100.0)
Platelets: 330 10*3/uL (ref 150–400)
RBC: 3.96 MIL/uL (ref 3.87–5.11)
RDW: 15.1 % (ref 11.5–15.5)
WBC: 12.1 10*3/uL — AB (ref 4.0–10.5)

## 2014-02-01 LAB — CREATININE, SERUM
Creatinine, Ser: 0.62 mg/dL (ref 0.50–1.10)
GFR calc non Af Amer: 77 mL/min — ABNORMAL LOW (ref >90)
GFR, EST AFRICAN AMERICAN: 89 mL/min — AB (ref >90)

## 2014-02-01 MED ORDER — VENLAFAXINE HCL ER 75 MG PO CP24
75.0000 mg | ORAL_CAPSULE | Freq: Every day | ORAL | Status: DC
  Administered 2014-02-02 – 2014-02-03 (×2): 75 mg via ORAL
  Filled 2014-02-01 (×4): qty 1

## 2014-02-01 MED ORDER — TRAMADOL HCL 50 MG PO TABS
50.0000 mg | ORAL_TABLET | Freq: Four times a day (QID) | ORAL | Status: DC | PRN
Start: 2014-02-01 — End: 2014-02-11
  Administered 2014-02-02 – 2014-02-09 (×3): 50 mg via ORAL
  Filled 2014-02-01 (×3): qty 1

## 2014-02-01 MED ORDER — ENOXAPARIN SODIUM 30 MG/0.3ML ~~LOC~~ SOLN
30.0000 mg | SUBCUTANEOUS | Status: DC
  Administered 2014-02-01 – 2014-02-14 (×14): 30 mg via SUBCUTANEOUS
  Filled 2014-02-01 (×15): qty 0.3

## 2014-02-01 MED ORDER — DONEPEZIL HCL 10 MG PO TABS
10.0000 mg | ORAL_TABLET | Freq: Every day | ORAL | Status: DC
  Administered 2014-02-02 – 2014-02-10 (×9): 10 mg via ORAL
  Filled 2014-02-01 (×11): qty 1

## 2014-02-01 MED ORDER — CHLORHEXIDINE GLUCONATE 0.12 % MT SOLN
15.0000 mL | Freq: Two times a day (BID) | OROMUCOSAL | Status: DC
  Administered 2014-02-01 – 2014-02-14 (×26): 15 mL via OROMUCOSAL
  Filled 2014-02-01 (×31): qty 15

## 2014-02-01 MED ORDER — PANTOPRAZOLE SODIUM 40 MG IV SOLR
40.0000 mg | INTRAVENOUS | Status: DC
  Administered 2014-02-02 – 2014-02-13 (×11): 40 mg via INTRAVENOUS
  Filled 2014-02-01 (×14): qty 40

## 2014-02-01 MED ORDER — ONDANSETRON HCL 4 MG PO TABS: 4.0000 mg | ORAL_TABLET | Freq: Four times a day (QID) | ORAL | Status: DC | PRN

## 2014-02-01 MED ORDER — JEVITY 1.2 CAL PO LIQD
1000.0000 mL | ORAL | Status: DC
  Administered 2014-02-02: 1000 mL
  Filled 2014-02-01 (×5): qty 1000

## 2014-02-01 MED ORDER — IOHEXOL 300 MG/ML  SOLN
50.0000 mL | Freq: Once | INTRAMUSCULAR | Status: AC | PRN
  Administered 2014-02-01: 30 mL

## 2014-02-01 MED ORDER — ASPIRIN 300 MG RE SUPP
300.0000 mg | Freq: Every day | RECTAL | Status: DC
  Administered 2014-02-02 – 2014-02-14 (×5): 300 mg via RECTAL
  Filled 2014-02-01 (×15): qty 1

## 2014-02-01 MED ORDER — ASPIRIN 325 MG PO TABS
325.0000 mg | ORAL_TABLET | Freq: Every day | ORAL | Status: DC
  Administered 2014-02-03 – 2014-02-11 (×8): 325 mg via ORAL
  Filled 2014-02-01 (×12): qty 1

## 2014-02-01 MED ORDER — LEVOTHYROXINE SODIUM 50 MCG PO TABS
50.0000 ug | ORAL_TABLET | Freq: Every day | ORAL | Status: DC
  Administered 2014-02-02 – 2014-02-11 (×9): 50 ug via ORAL
  Filled 2014-02-01 (×11): qty 1

## 2014-02-01 MED ORDER — ACETAMINOPHEN 325 MG PO TABS
325.0000 mg | ORAL_TABLET | ORAL | Status: DC | PRN
  Administered 2014-02-03 – 2014-02-10 (×8): 650 mg via ORAL
  Filled 2014-02-01 (×9): qty 2

## 2014-02-01 MED ORDER — ONDANSETRON HCL 4 MG/2ML IJ SOLN: 4.0000 mg | Freq: Four times a day (QID) | INTRAMUSCULAR | Status: DC | PRN

## 2014-02-01 MED ORDER — SORBITOL 70 % SOLN: 30.0000 mL | Freq: Every day | Status: DC | PRN

## 2014-02-01 MED ORDER — TRAZODONE HCL 50 MG PO TABS: 50.0000 mg | ORAL_TABLET | Freq: Every evening | ORAL | Status: DC | PRN

## 2014-02-01 MED ORDER — ASPIRIN 325 MG PO TABS: 325.0000 mg | ORAL_TABLET | Freq: Every day | ORAL | Status: AC

## 2014-02-01 MED ORDER — ENOXAPARIN SODIUM 30 MG/0.3ML ~~LOC~~ SOLN: 30.0000 mg | SUBCUTANEOUS | Status: DC

## 2014-02-01 MED ORDER — SODIUM CHLORIDE 0.9 % IV SOLN
INTRAVENOUS | Status: DC
  Administered 2014-02-01 – 2014-02-09 (×9): via INTRAVENOUS
  Administered 2014-02-10: 1000 mL via INTRAVENOUS
  Administered 2014-02-11 – 2014-02-13 (×3): via INTRAVENOUS

## 2014-02-01 MED ORDER — SENNOSIDES-DOCUSATE SODIUM 8.6-50 MG PO TABS
1.0000 | ORAL_TABLET | Freq: Every evening | ORAL | Status: DC | PRN
  Filled 2014-02-01: qty 1

## 2014-02-01 NOTE — Discharge Summary (Signed)
Physician Discharge Summary  Patient ID: Sarah Daniel MRN: [id number] DOB/AGE: Oct 04, 1921 80 y.o.  Admit date: 01/29/2014 Discharge date: 02/01/2014  Primary Care Physician:  Lillia Mountain, MD  Discharge Diagnoses:    . acute/subacute right MCA CVA  . dysphagia from CVA   Left-sided hemiparesis  . Coronary artery disease . Dementia . Hypertension . Hypothyroidism  Consults:  Neurology/stroke service, Dr. Roda Shutters                    Inpatient rehabilitation   Recommendations for Outpatient Follow-up:  Patient will need swallow evaluation tomorrow and periodically during the rehabilitation, to evaluate improvement in her swallow function.  Continue Jevity 1.2 at 45 mL an hour   Allergies:  No Known Allergies   Discharge Medications:   Medication List         aspirin 325 MG tablet  Take 1 tablet (325 mg total) by mouth daily.     atorvastatin 40 MG tablet  Commonly known as:  LIPITOR  Take 40 mg by mouth daily.     cholecalciferol 1000 UNITS tablet  Commonly known as:  VITAMIN D  Take 1,000 Units by mouth daily.     donepezil 10 MG tablet  Commonly known as:  ARICEPT  Take 10 mg by mouth daily.     IRON PO  Take 1 tablet by mouth daily.     levothyroxine 50 MCG tablet  Commonly known as:  SYNTHROID, LEVOTHROID  Take 50 mcg by mouth daily before breakfast.     venlafaxine XR 75 MG 24 hr capsule  Commonly known as:  EFFEXOR-XR  Take 75 mg by mouth daily with breakfast.         Brief H and P: For complete details please refer to admission H and P, but in briefElsie Daniel is a 80 y.o. female with PMH of CAD, HTN, Dementia, hypothyroidism was brought to the ER by EMS today after her daughter found her down this morning beside her bed, when she had a facial droop and L hemiparesis.  Patient is a very poor historian.  Hospital Course:    L hemiparesis/L hemi neglect and facial droop /acute CVA  - CT head showed acute/subacute right MCA distribution  infarct in the right frontoparietal region  - MRI confirmed acute nonhemorrhagic anterior right MCA territory infarct, remote posterior right MCA territory infarct  - MRA showed occlusion of right internal carotid artery with partial reconstitution in the cavernous and ophthalmic segments, diminished flow intensity in the right M1 and A1 segments, mild to distal small vessel disease and left MCA branches  - ECHO 55% to 60%,  - carotid duplex showed 1-39% left ICA stenosis, possible occlusion in the right ICA, left subclavian steal syndrome, asymptomatic  - Lipid panel showed LDL of 61, hbAic 5.3  - PT/OT- recommended inpatient rehabilitation - ASA 300mg  PR daily since failed swallow testing, on ASA 81 and plavix 75mg  at home prior to admission. Once patient is able to swallow or [name] tube, should be on aspirin 325mg . 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  Dysphagia  Failed swallow eval, placed [name] tube, working with PT, following verbal commands, for now placed on tube feedings per nutrition recommendations. Patient should have periodic swallow studies, if fails, daughter will discuss with the family regarding PEG tube.   Dementia  -mild per daughter,  resume aricept once able to swallow   Coronary artery disease  -with h/o PCI and stents in 2013 per [place] , continue full dose aspirin    Hypothyroidism  -resume synthroid when he passes swallow evaluation   Day of Discharge BP 150/83  Pulse 83  Temp(Src) 98 F (36.7 C) (Oral)  Resp 18  Ht 4\' 9"  (1.448 m)  Wt 40.688 kg (89 lb 11.2 oz)  BMI 19.41 kg/m2  SpO2 97%  Physical Exam:  General: Alert and awake, oriented to self  CVS: S1-S2 clear, no murmur rubs or gallops  Chest: clear to auscultation bilaterally anteriorly  Abdomen: soft nontender, nondistended, normal bowel sounds   Extremities: no cyanosis, clubbing or edema  Neuro: Left-sided hemiparesis      The results of significant diagnostics from this hospitalization (including imaging, microbiology, ancillary and laboratory) are listed below for reference.    LAB RESULTS: Basic Metabolic Panel:  Recent Labs Lab 01/29/14 1542 01/29/14 1555  NA 138 139  K 4.3 4.1  CL 101 104  CO2 24  --   GLUCOSE 154* 152*  BUN 23 23  CREATININE 0.90 0.90  CALCIUM 9.4  --    Liver Function Tests:  Recent Labs Lab 01/29/14 1542  AST 26  ALT 29  ALKPHOS 83  BILITOT 0.3  PROT 7.0  ALBUMIN 3.6   No results found for this basename: LIPASE, AMYLASE,  in the last 168 hours No results found for this basename: AMMONIA,  in the last 168 hours CBC:  Recent Labs Lab 01/29/14 1542 01/29/14 1555  WBC 7.4  --   NEUTROABS 6.4  --   HGB 10.8* 12.9  HCT 34.1* 38.0  MCV 92.2  --   PLT 301  --    Cardiac Enzymes: No results found for this basename: CKTOTAL, CKMB, CKMBINDEX, TROPONINI,  in the last 168 hours BNP: No components found with this basename: POCBNP,  CBG:  Recent Labs Lab 02/01/14 0411 02/01/14 1132  GLUCAP 95 95    Significant Diagnostic Studies:  Dg Chest 1 View  01/29/2014   CLINICAL DATA:  Left-sided weakness, slurred speech. History of hypertension. Found on floor.  EXAM: CHEST - 1 VIEW  COMPARISON:  03/08/2013  FINDINGS: Heart is normal size. No confluent airspace opacities or effusions. Calcifications in the aortic arch. No visible aneurysm. No acute bony abnormality.  IMPRESSION: No active disease.   Electronically Signed   By: Charlett NoseKevin  Dover M.D.   On: 01/29/2014 17:03   Ct Head Wo Contrast  01/29/2014   CLINICAL DATA:  Stroke symptoms, left-sided weakness, slurred speech.  EXAM: CT HEAD WITHOUT CONTRAST  TECHNIQUE: Contiguous axial images were obtained from the base of the skull through the vertex without intravenous contrast.  COMPARISON:  None.  FINDINGS: Cortical hypodensity in the  right frontoparietal region (series 201/ image 16), suspicious for acute/subacute right MCA distribution infarct.  No evidence of parenchymal hemorrhage or extra-axial fluid collection.  No mass lesion, mass effect, or midline shift.  Subcortical white matter and periventricular small vessel ischemic changes. Intracranial atherosclerosis.  Global cortical atrophy.  No ventriculomegaly.  The visualized paranasal sinuses are essentially clear. The mastoid air cells are unopacified.  No evidence of calvarial fracture.  IMPRESSION: Suspected acute/subacute right MCA distribution infarct.  These results were called by telephone at the time of interpretation on 01/29/2014 at 4:49 pm to Dr. Lynelle DoctorKnapp, who verbally acknowledged these results.   Electronically Signed   By: Roselie AwkwardSriyesh  Krishnan M.D.  On: 01/29/2014 16:49   Ct Angio Neck W/cm &/or Wo/cm  01/30/2014   CLINICAL DATA:  Acute right MCA territory infarct. Right internal carotid artery occlusion.  EXAM: CT ANGIOGRAPHY NECK  TECHNIQUE: Multidetector CT imaging of the neck was performed using the standard protocol during bolus administration of intravenous contrast. Multiplanar CT image reconstructions and MIPs were obtained to evaluate the vascular anatomy. Carotid stenosis measurements (when applicable) are obtained utilizing NASCET criteria, using the distal internal carotid diameter as the denominator.  CONTRAST:  50 mL Omnipaque 350  COMPARISON:  MRI brain and MRA head 01/29/2014  FINDINGS: There is a common origin of the left common carotid artery and the innominate artery. Dense atherosclerotic calcifications are present in the distal arch. The subclavian artery is occluded less than 1 cm from the origin. It is reconstituted at the level of the left vertebral artery.  The vertebral arteries originate from the subclavian arteries bilaterally. Calcifications are present at the origin of the vertebral arteries without significant stenosis. There is marked tortuosity  in the proximal right vertebral artery with a 50% stenosis. Contrast density is less and the proximal left vertebral artery been in the right compatible with a subclavian steal phenomenon. There are no significant vertebral artery stenoses in the neck. Atherosclerotic irregularity is present within the distal vertebral arteries bilaterally, left greater than right without significant stenosis. The PICA origins are visualized and normal.  The right common carotid artery is tortuous proximally without significant stenosis. The right internal carotid artery is occluded at its origin. There is no reconstitution in the neck. The artery is reconstituted within the cavernous segments, likely from the ophthalmic artery. Dense calcifications are present within the cavernous right internal carotid artery.  The left common carotid artery is within normal limits. The bifurcation demonstrates atherosclerotic change without a significant stenosis relative to the distal vessel. The cervical and proximal intracranial left ICA is unremarkable.  Limited imaging of the brain again demonstrates the right MCA territory infarcts.  There is fusion of posterior elements at C2-3. Anterolisthesis at C4-5 and C5-6 appears degenerative with advanced facet disease at both levels. There is chronic loss of height and endplate changes at C6-7 and to lesser extent at C7-T1. No acute osseous abnormality is present.  Review of the MIP images confirms the above findings.  IMPRESSION: 1. The right internal carotid artery is occluded at the bifurcation. It is reconstituted within the cavernous segment, likely from the right ophthalmic artery. 2. The proximal left subclavian artery is occluded less than 10 mm from the aortic arch. It is reconstituted at the level of the left vertebral artery, likely from retrograde flow is discussed. 3. Atherosclerotic changes within the left carotid artery bifurcation without significant stenosis. 4. Atherosclerotic  changes within the vertebral arteries above the dural margin bilaterally without significant stenoses. 5. Advanced spondylosis of the cervical spine.   Electronically Signed   By: Gennette Pachris  Mattern M.D.   On: 01/30/2014 13:09   Mr Maxine GlennMra Head Wo Contrast  01/29/2014   CLINICAL DATA:  Left-sided weakness, slurred speech, and rightward gaze. Headaches. Stroke.  EXAM: MRI HEAD WITHOUT CONTRAST  MRA HEAD WITHOUT CONTRAST  TECHNIQUE: Multiplanar, multiecho pulse sequences of the brain and surrounding structures were obtained without intravenous contrast. Angiographic images of the head were obtained using MRA technique without contrast.  COMPARISON:  CT head from the same day.  FINDINGS: MRI HEAD FINDINGS  The diffusion-weighted images confirm an acute nonhemorrhagic anterior right MCA territory infarct involving the right  frontal operculum and insular cortex. There is a focus of restricted diffusion within the lateral right temporal lobe as well a more remote posterior right temporal and parietal infarct is again noted. No acute hemorrhage or mass lesion is present.  Advanced atrophy and diffuse white matter changes are otherwise stable. There dilated perivascular spaces and remote lacunar infarcts within the basal ganglia bilaterally.  Flow is present in the left internal carotid artery in the vertebrobasilar system. There is no flow within the right carotid artery. Abnormal signal suggests attenuated or occluded flow within the right middle cerebral artery as well.  Patient is status post bilateral lens extractions. The globes and orbits are otherwise intact. Paranasal sinuses are clear. There is some fluid in left mastoid air cells. No obstructing at nasopharyngeal lesion is evident.  MRA HEAD FINDINGS  The right internal carotid artery is occluded. There is partial reconstitution at the level of the ophthalmic artery with some flow seen into the anterior genu of the cavernous carotid artery as well. There is markedly  attenuated flow within the right M1 and A1 segments. No MCA territory branch vessels are seen. There is markedly attenuated signal within the right A2 segments. No definite anterior communicating artery is evident. The left A1 and M1 segments are normal. The left MCA bifurcation is within normal limits. There is some attenuation of distal left MCA branch vessels. The left ACA branch vessels are normal.  The right vertebral artery is the dominant vessel. The PICA origins are visualized and normal bilaterally. The basilar artery is within normal limits. Both posterior cerebral arteries originate from the basilar tip. Asymmetric attenuation of PCA branch vessels is worse on the left.  IMPRESSION: 1. Acute nonhemorrhagic anterior right MCA territory infarct. 2. More remote posterior right MCA territory infarcts. 3. Occlusion of the right internal carotid artery with partial reconstitution in the cavernous and ophthalmic segments. 4. Diminished flow intensity in the right M1 and A1 segments with no flow signal seen beyond the right MCA bifurcation. 5. Mild distal small vessel disease in the left MCA branch vessels and left greater than right PCA branch vessels. These results were called by telephone at the time of interpretation on 01/29/2014 at 7:02 pm to Dr. Wyatt Portela , who verbally acknowledged these results.   Electronically Signed   By: Gennette Pac M.D.   On: 01/29/2014 19:02   Mr Brain Wo Contrast  01/29/2014   CLINICAL DATA:  Left-sided weakness, slurred speech, and rightward gaze. Headaches. Stroke.  EXAM: MRI HEAD WITHOUT CONTRAST  MRA HEAD WITHOUT CONTRAST  TECHNIQUE: Multiplanar, multiecho pulse sequences of the brain and surrounding structures were obtained without intravenous contrast. Angiographic images of the head were obtained using MRA technique without contrast.  COMPARISON:  CT head from the same day.  FINDINGS: MRI HEAD FINDINGS  The diffusion-weighted images confirm an acute  nonhemorrhagic anterior right MCA territory infarct involving the right frontal operculum and insular cortex. There is a focus of restricted diffusion within the lateral right temporal lobe as well a more remote posterior right temporal and parietal infarct is again noted. No acute hemorrhage or mass lesion is present.  Advanced atrophy and diffuse white matter changes are otherwise stable. There dilated perivascular spaces and remote lacunar infarcts within the basal ganglia bilaterally.  Flow is present in the left internal carotid artery in the vertebrobasilar system. There is no flow within the right carotid artery. Abnormal signal suggests attenuated or occluded flow within the right middle cerebral artery  as well.  Patient is status post bilateral lens extractions. The globes and orbits are otherwise intact. Paranasal sinuses are clear. There is some fluid in left mastoid air cells. No obstructing at nasopharyngeal lesion is evident.  MRA HEAD FINDINGS  The right internal carotid artery is occluded. There is partial reconstitution at the level of the ophthalmic artery with some flow seen into the anterior genu of the cavernous carotid artery as well. There is markedly attenuated flow within the right M1 and A1 segments. No MCA territory branch vessels are seen. There is markedly attenuated signal within the right A2 segments. No definite anterior communicating artery is evident. The left A1 and M1 segments are normal. The left MCA bifurcation is within normal limits. There is some attenuation of distal left MCA branch vessels. The left ACA branch vessels are normal.  The right vertebral artery is the dominant vessel. The PICA origins are visualized and normal bilaterally. The basilar artery is within normal limits. Both posterior cerebral arteries originate from the basilar tip. Asymmetric attenuation of PCA branch vessels is worse on the left.  IMPRESSION: 1. Acute nonhemorrhagic anterior right MCA territory  infarct. 2. More remote posterior right MCA territory infarcts. 3. Occlusion of the right internal carotid artery with partial reconstitution in the cavernous and ophthalmic segments. 4. Diminished flow intensity in the right M1 and A1 segments with no flow signal seen beyond the right MCA bifurcation. 5. Mild distal small vessel disease in the left MCA branch vessels and left greater than right PCA branch vessels. These results were called by telephone at the time of interpretation on 01/29/2014 at 7:02 pm to Dr. Wyatt Portela , who verbally acknowledged these results.   Electronically Signed   By: Gennette Pac M.D.   On: 01/29/2014 19:02    2D ECHO: Study Conclusions  - Procedure narrative: Transthoracic echocardiography. Image quality was suboptimal. The study was technically difficult, as a result of poor acoustic windows and restricted patient mobility. - Left ventricle: The cavity size was normal. Wall thickness was normal. Systolic function was normal. The estimated ejection fraction was in the range of 55% to 60%. Wall motion was normal; there were no regional wall motion abnormalities. Doppler parameters are consistent with abnormal left ventricular relaxation (grade 1 diastolic dysfunction). - Aortic valve: There was trivial regurgitation. - Mitral valve: Mildly thickened leaflets . There was mild to moderate regurgitation. - Left atrium: The atrium was moderately dilated.     Disposition and Follow-up:     Discharge Instructions   Ambulatory referral to Neurology    Complete by:  As directed   Pt will follow up with Dr. Roda Shutters at [facility] in about 2 months. Thanks            DISPOSITION: Inpatient rehabilitation  DIET: Tube feeding   DISCHARGE FOLLOW-UP Follow-up Information   Follow up with Xu,Jindong, MD. Schedule an appointment as soon as possible for a visit in 2 months. (for hospital follow-up, stroke clinic)    Specialty:  Neurology   Contact information:   [address] [place] [address] [phone]       Time spent on Discharge: 40 mins  Signed:   RAI,RIPUDEEP M.D. [facility] 02/01/2014, 11:56 AM Pager: [phone]

## 2014-02-01 NOTE — Clinical Social Work Note (Signed)
Clinical Social Worker notified by [employer], Campbell LernerJanine of CIR acceptance. Patient will be admitted into inpatient rehab today. Clinical Social Worker will sign off for now as social work intervention is no longer needed. Please consult us again if new need arises.  Derenda FennelBashira Henna Derderian, MSW, LCSWA [phone] 02/01/2014 11:09 AM

## 2014-02-01 NOTE — Progress Notes (Signed)
Nutrition Follow-up/Consult  INTERVENTION: As soon as NG tube is placed, Initiate Jevity 1.2 @ 20 ml/hr via NG tube and increase by 10 ml every 4 hours to goal rate of 45 ml/hr.   Tube feeding regimen will provide 1296 kcal (104% of needs), 60 grams of protein, and 875 ml of H2O.   When IV fluids are discontinued, add 100 ml free water flushes every 6 hours (4 times daily) to provide a total of 1275 ml daily.    NUTRITION DIAGNOSIS: Inadequate oral intake related to inability to eat as evidenced by NPO status; ongoing  Goal: Pt to meet >/= 90% of their estimated nutrition needs; unmet  Monitor:  Diet advancement, PO intake, weight trend, labs  Reason for Assessment: Consult for tube feeding initiation and management  80 y.o. female  Admitting Dx: Stroke  ASSESSMENT: 80 y.o. female with PMH of CAD, HTN, Dementia, hypothyroidism was brought to the ER by EMS today after her daughter found her down beside her bed, when she had a facial droop and L hemiparesis.  Several attempts to place NG tube last night without success. Tube was successfully placed earlier today but, pt has already pulled tube out by time of RD visit. Per RN, tube feedings were never started. Pt is being discharged to rehab today.  Pt's weight continues to trend down. Labs reviewed.   Height: Ht Readings from Last 1 Encounters:  01/30/14 4\' 9"  (1.448 m)    Weight: Wt Readings from Last 1 Encounters:  02/01/14 89 lb 11.2 oz (40.688 kg)  01/30/14 93 lb   BMI:  Body mass index is 19.41 kg/(m^2).  Estimated Nutritional Needs: Kcal: 1100-1250 Protein: 50-60 grams Fluid: 1.3 L/day  Skin: intact  Diet Order: NPO   No intake or output data in the 24 hours ending 02/01/14 1547  Last BM: 10/25   Labs:   Recent Labs Lab 01/29/14 1542 01/29/14 1555  NA 138 139  K 4.3 4.1  CL 101 104  CO2 24  --   BUN 23 23  CREATININE 0.90 0.90  CALCIUM 9.4  --   GLUCOSE 154* 152*    CBG (last 3)    Recent Labs  02/01/14 02/01/14 0411 02/01/14 1132  GLUCAP 97 95 95    Scheduled Meds: . antiseptic oral rinse  7 mL Mouth Rinse q12n4p  . aspirin  300 mg Rectal Daily   Or  . aspirin  325 mg Oral Daily  . chlorhexidine  15 mL Mouth Rinse BID  . donepezil  10 mg Oral QHS  . enoxaparin (LOVENOX) injection  30 mg Subcutaneous Q24H  . levothyroxine  50 mcg Oral QAC breakfast  . pantoprazole (PROTONIX) IV  40 mg Intravenous Q24H  . venlafaxine XR  75 mg Oral Q breakfast    Continuous Infusions: . sodium chloride 60 mL/hr at 01/31/14 2009  . feeding supplement (JEVITY 1.2 CAL)      Ian Malkineanne Barnett RD, LDN Inpatient Clinical Dietitian Pager: [phone] After Hours Pager: [phone]

## 2014-02-01 NOTE — H&P (Addendum)
Physical Medicine and Rehabilitation Admission H&P    Chief Complaint  Patient presents with  . Stroke Symptoms  : HPI: Sarah Daniel is a 80 y.o. right handed female with history of dementia maintained on Aricept, coronary artery disease, hypertension. By report patient lives with her daughter independent prior to admission and assistance as needed. Presented 01/29/2014 after being found down by her daughter with left-sided weakness and facial droop. Patient was a poor medical historian. MRI/MRA of the brain shows acute nonhemorrhagic anterior right MCA territory infarct as well as occlusion of the right internal carotid artery with partial reconstitution of the cavernous and ophthalmic segments. CT angiogram of the neck again showing right internal carotid artery occlusion as well as proximal left subclavian artery occlusion less than 10 mm from the aortic arch. Patient did not receive TPA. Echocardiogram with ejection fraction of 60% grade 1 diastolic dysfunction. EEG with mild diffuse slowing of the waking background without seizure activity. Neurology service  consulted maintained on aspirin for CVA prophylaxis as well as subcutaneous Lovenox for DVT prophylaxis. Swallow evaluation per speech therapy 01/30/2014 patient currently nothing by mouth with nasogastric tube in place for nutritional support Physical occupational therapy evaluations completed with recommendations of physical medicine rehabilitation consult. Patient was admitted for comprehensive rehabilitation program   Review of Systems  Respiratory: Positive for shortness of breath.    Review of Systems  Unable due to mental acuity and language   Past Medical History  Diagnosis Date  . Dementia   . Hypertension   . GERD (gastroesophageal reflux disease)   . Thyroid disease   . PUD (peptic ulcer disease)   . Coronary artery disease   . Hypothyroidism   . Arthritis    Past Surgical History  Procedure Laterality Date  .  Abdominal hysterectomy     History reviewed. No pertinent family history. Social History:  reports that she has quit smoking. She has never used smokeless tobacco. She reports that she does not drink alcohol or use illicit drugs. Allergies: No Known Allergies Medications Prior to Admission  Medication Sig Dispense Refill  . atorvastatin (LIPITOR) 40 MG tablet Take 40 mg by mouth daily.      . cholecalciferol (VITAMIN D) 1000 UNITS tablet Take 1,000 Units by mouth daily.      Marland Kitchen donepezil (ARICEPT) 10 MG tablet Take 10 mg by mouth daily.       . IRON PO Take 1 tablet by mouth daily.      Marland Kitchen levothyroxine (SYNTHROID, LEVOTHROID) 50 MCG tablet Take 50 mcg by mouth daily before breakfast.      . venlafaxine XR (EFFEXOR-XR) 75 MG 24 hr capsule Take 75 mg by mouth daily with breakfast.        Home: Home Living Family/patient expects to be discharged to:: Private residence Living Arrangements: Children (daughter) Available Help at Discharge: Family;Available PRN/intermittently Type of Home: House Home Access: Level entry Home Layout: One level;Able to live on main level with bedroom/bathroom Home Equipment: Gilmer Mor - single point Additional Comments: walk-in shower  Lives With: Daughter   Functional History: Prior Function Level of Independence: Independent Comments: pt. overall very independent, at home up to 4 hours without supervision  Functional Status:  Mobility: Bed Mobility Overal bed mobility: Needs Assistance Bed Mobility: Rolling;Sidelying to Sit Rolling: Min assist Sidelying to sit: Min assist (with rail) Supine to sit: Mod assist Sit to supine: Mod assist Sit to sidelying: Mod assist General bed mobility comments: min assist to find  LUE with Rt and bring across body; pt then used RUE and rail to roll and come to sit; min assist for LLe guidance over EOB (inattention) Transfers Overall transfer level: Needs assistance Equipment used: 1 person hand held assist Transfers:  Sit to/from [employer]GI CorporationStand;Stand Pivot Transfers Sit to Stand: Mod assist Stand pivot transfers: Mod assist General transfer comment: assist to obtain proper position pror to stand; pt with lean to her Left; good weightbearing through LLE Ambulation/Gait Ambulation/Gait assistance: Mod assist Ambulation Distance (Feet): 40 Feet Assistive device:  (RUE pushing IV pole) Gait Pattern/deviations: Step-to pattern;Decreased step length - right;Decreased stance time - left;Decreased weight shift to right;Steppage;Wide base of support (Lt knee hyperextension) Gait velocity interpretation: <1.8 ft/sec, indicative of risk for recurrent falls General Gait Details: bearing weight on LLE without buckling, however required assist to prevent knee hyperextension and occasional placement assist at pelvis and ribs for weightshifting/balance; pt easily distracted and with much difficulty turning to her Left    ADL: ADL Overall ADL's : Needs assistance/impaired Grooming: Brushing hair;Moderate assistance (applying lotion) Upper Body Bathing: Sitting;Moderate assistance Lower Body Bathing: Maximal assistance;Sit to/from stand Upper Body Dressing : Sitting;Maximal assistance Lower Body Dressing: Maximal assistance;Sit to/from stand Toilet Transfer: Stand-pivot;BSC;Maximal assistance Toileting- Clothing Manipulation and Hygiene: Bed level;Maximal assistance;Sitting/lateral lean Functional mobility during ADLs: Maximal assistance (stand pivot) General ADL Comments: Recommended family sit on left side to try to get pt to look to left. Pt incontinent of stool when OT arrived. Assisted in cleaning pt. Discussed with family member safety/positioning of LUE and to have it elevated on pillow. Discussed CIR with family member. Pt with decreased balance sitting EOB-leaning back at times.  Cognition: Cognition Overall Cognitive Status: Impaired/Different from baseline Arousal/Alertness: Lethargic Orientation Level: Oriented to  person;Oriented to place;Oriented to situation;Disoriented to time Attention: Focused Focused Attention: Appears intact Memory: Impaired Memory Impairment: Storage deficit (not tested, suspect impairment due to low level attention) Awareness: Impaired Awareness Impairment: Intellectual impairment;Emergent impairment Problem Solving: Impaired Problem Solving Impairment: Verbal basic Executive Function: Initiating;Self Monitoring Initiating: Impaired Initiating Impairment: Verbal basic;Functional basic Self Monitoring: Impaired Self Monitoring Impairment: Verbal basic;Functional basic Cognition Arousal/Alertness: Awake/alert Behavior During Therapy: Restless;Impulsive Overall Cognitive Status: Impaired/Different from baseline Area of Impairment: Attention;Safety/judgement;Following commands Current Attention Level: Sustained Following Commands: Follows one step commands inconsistently Safety/Judgement: Decreased awareness of safety;Decreased awareness of deficits Difficult to assess due to: Impaired communication;Level of arousal  Physical Exam: Blood pressure 150/83, pulse 83, temperature 98 F (36.7 C), temperature source Oral, resp. rate 18, height 4\' 9"  (1.448 m), weight 40.688 kg (89 lb 11.2 oz), SpO2 97.00%. Physical Exam Pt lying in bed with head turned to the right HENT:  [name] tube pulled out. Oral mucosa appears generally pink/moist Eyes:  Pupils reactive to light  Neck: Normal range of motion. Neck supple. No thyromegaly present.  Cardiovascular: Normal rate and regular rhythm. No murmurs Respiratory: Effort normal and breath sounds normal. No respiratory distress. No wheezes, rales GI: Soft. Bowel sounds are normal. She exhibits no distension.  Neurological: She is alert.  Patient displays a severe left inattention. (neck/SCM is actually tight due to severe rotation to right). Will not maintain neutral even when head passively rotated to neutral. She would follow some  inconsistent commands. She did attempt mouth most words but mostly unintelligible. Makes little eye contact. 0/5  LUE.  LLE tr to 1/5 HF,KE, 0/5 Left ADF/APF. RUE grossly 3- to 3/5 proximal to distal. LLE similar at 3- to 3/5.Marland Kitchen. Does not react to painful stimulation LUE  or LLE. DTR's 3+ LLE. Toes up.   Skin: Skin is warm and dry.  Psychiatric:  Flat, still does not engage   Results for orders placed during the hospital encounter of 01/29/14 (from the past 48 hour(s))  GLUCOSE, CAPILLARY     Status: None   Collection Time    01/31/14  4:17 PM      Result Value Ref Range   Glucose-Capillary 99  70 - 99 mg/dL  GLUCOSE, CAPILLARY     Status: Abnormal   Collection Time    01/31/14  9:13 PM      Result Value Ref Range   Glucose-Capillary 102 (*) 70 - 99 mg/dL  GLUCOSE, CAPILLARY     Status: None   Collection Time    02/01/14 12:00 AM      Result Value Ref Range   Glucose-Capillary 97  70 - 99 mg/dL  GLUCOSE, CAPILLARY     Status: None   Collection Time    02/01/14  4:11 AM      Result Value Ref Range   Glucose-Capillary 95  70 - 99 mg/dL   Ct Angio Neck W/cm &/or Wo/cm  01/30/2014   CLINICAL DATA:  Acute right MCA territory infarct. Right internal carotid artery occlusion.  EXAM: CT ANGIOGRAPHY NECK  TECHNIQUE: Multidetector CT imaging of the neck was performed using the standard protocol during bolus administration of intravenous contrast. Multiplanar CT image reconstructions and MIPs were obtained to evaluate the vascular anatomy. Carotid stenosis measurements (when applicable) are obtained utilizing NASCET criteria, using the distal internal carotid diameter as the denominator.  CONTRAST:  50 mL Omnipaque 350  COMPARISON:  MRI brain and MRA head 01/29/2014  FINDINGS: There is a common origin of the left common carotid artery and the innominate artery. Dense atherosclerotic calcifications are present in the distal arch. The subclavian artery is occluded less than 1 cm from the origin. It is  reconstituted at the level of the left vertebral artery.  The vertebral arteries originate from the subclavian arteries bilaterally. Calcifications are present at the origin of the vertebral arteries without significant stenosis. There is marked tortuosity in the proximal right vertebral artery with a 50% stenosis. Contrast density is less and the proximal left vertebral artery been in the right compatible with a subclavian steal phenomenon. There are no significant vertebral artery stenoses in the neck. Atherosclerotic irregularity is present within the distal vertebral arteries bilaterally, left greater than right without significant stenosis. The PICA origins are visualized and normal.  The right common carotid artery is tortuous proximally without significant stenosis. The right internal carotid artery is occluded at its origin. There is no reconstitution in the neck. The artery is reconstituted within the cavernous segments, likely from the ophthalmic artery. Dense calcifications are present within the cavernous right internal carotid artery.  The left common carotid artery is within normal limits. The bifurcation demonstrates atherosclerotic change without a significant stenosis relative to the distal vessel. The cervical and proximal intracranial left ICA is unremarkable.  Limited imaging of the brain again demonstrates the right MCA territory infarcts.  There is fusion of posterior elements at C2-3. Anterolisthesis at C4-5 and C5-6 appears degenerative with advanced facet disease at both levels. There is chronic loss of height and endplate changes at C6-7 and to lesser extent at C7-T1. No acute osseous abnormality is present.  Review of the MIP images confirms the above findings.  IMPRESSION: 1. The right internal carotid artery is occluded at the bifurcation. It is  reconstituted within the cavernous segment, likely from the right ophthalmic artery. 2. The proximal left subclavian artery is occluded less  than 10 mm from the aortic arch. It is reconstituted at the level of the left vertebral artery, likely from retrograde flow is discussed. 3. Atherosclerotic changes within the left carotid artery bifurcation without significant stenosis. 4. Atherosclerotic changes within the vertebral arteries above the dural margin bilaterally without significant stenoses. 5. Advanced spondylosis of the cervical spine.   Electronically Signed   By: Gennette Pachris  Mattern M.D.   On: 01/30/2014 13:09       Medical Problem List and Plan: 1. Functional deficits secondary to right MCA infarct with dense left hemiparesis and visual-spatial deficits 2.  DVT Prophylaxis/Anticoagulation: Subcutaneous Lovenox. Monitor platelet counts and any signs of bleeding 3. Pain Management: Ultram as needed. 4. Mood/depression/dementia. Aricept 10 mg daily at bedtime, Effexor 75 mg daily. Discussed baseline cognition with family 5. Neuropsych: This patient is not capable of making decisions on her own behalf. 6. Skin/Wound Care: Routine skin checks as well as routine turning to maintain skin integrity 7. Fluids/Electrolytes/Nutrition: Follow-up chemistries. Strict I and O's. 8. Dysphagia. Nasogastric tube feeds. Follow-up speech therapy. IV fluids to maintain hydration  -panda will need to be replaced today 9. Hypothyroidism. Synthroid. Latest TSH level 2.929 10. GERD. Protonix   Post Admission Physician Evaluation: 1. Functional deficits secondary  to right MCA infarct with dense left hemiparesis and visual-spatial deficits. 2. Patient is admitted to receive collaborative, interdisciplinary care between the physiatrist, rehab nursing staff, and therapy team. 3. Patient's level of medical complexity and substantial therapy needs in context of that medical necessity cannot be provided at a lesser intensity of care such as a SNF. 4. Patient has experienced substantial functional loss from his/her baseline which was documented above under  the "Functional History" and "Functional Status" headings.  Judging by the patient's diagnosis, physical exam, and functional history, the patient has potential for functional progress which will result in measurable gains while on inpatient rehab.  These gains will be of substantial and practical use upon discharge  in facilitating mobility and self-care at the household level. 5. Physiatrist will provide 24 hour management of medical needs as well as oversight of the therapy plan/treatment and provide guidance as appropriate regarding the interaction of the two. 6. 24 hour rehab nursing will assist with bladder management, bowel management, safety, skin/wound care, disease management, medication administration, pain management and patient education  and help integrate therapy concepts, techniques,education, etc. 7. PT will assess and treat for/with: Lower extremity strength, range of motion, stamina, balance, functional mobility, safety, adaptive techniques and equipment, NMR, visual spatial and cognitive perceptual awareness, skin care, pain mgt, ego support.   Goals are: min to mod assist. 8. OT will assess and treat for/with: ADL's, functional mobility, safety, upper extremity strength, adaptive techniques and equipment, NMR, cognitive and visual perceptual awareness.   Goals are: min to mod assist. Therapy may not proceed with showering this patient. 9. SLP will assess and treat for/with: speech, language, communication, family ed.  Goals are: mod assist. 10. Case Management and Social Worker will assess and treat for psychological issues and discharge planning. 11. Team conference will be held weekly to assess progress toward goals and to determine barriers to discharge. 12. Patient will receive at least 3 hours of therapy per day at least 5 days per week. 13. ELOS: 22-30 days       14. Prognosis:  good     Earna CoderZachary  Ermalene Postin, MD, [facility] Physical Medicine &  Rehabilitation 02/01/2014   02/01/2014

## 2014-02-01 NOTE — Interval H&P Note (Signed)
Sarah Daniel was admitted today to Inpatient Rehabilitation with the diagnosis of right MCA infarct.  The patient's history has been reviewed, patient examined, and there is no change in status.  Patient continues to be appropriate for intensive inpatient rehabilitation.  I have reviewed the patient's chart and labs.  Questions were answered to the patient's satisfaction.  SWARTZ,ZACHARY T 02/01/2014, 10:55 PM

## 2014-02-01 NOTE — Progress Notes (Signed)
Rehab admissions - I did receive insurance approval from [employer] for inpatient rehab. I spoke with Dr. Isidoro Donningai who gave medical clearance for inpatient rehab [facility]. Bed is available and will admit to inpatient rehab today after Karie Sodaanda is placed.  I called and updated dtr by phone as [facility] and no family in room. Dtr was pleased with the plan to bring her mom to inpatient rehab today.  I called and updated Steward DroneBrenda, case Production designer, theatre/television/filmmanager and Adria DevonBashira, Child psychotherapistsocial worker. I will be meeting pt's dtr this afternoon around 1400 to complete admission paperwork.  Please call me with any questions. Thanks.  Juliann MuleJanine Kaige Whistler, PT Rehabilitation Admissions Coordinator [phone]

## 2014-02-01 NOTE — Progress Notes (Signed)
Pt. Discharged to CIR. Prior to transfer, xray revealed the small bore tube properly placed in the stomach. Report given to receiving nurse Sue LushAndrea.

## 2014-02-01 NOTE — Progress Notes (Signed)
Speech Language Pathology Treatment: Dysphagia;Cognitive-Linquistic  Patient Details Name: Carmen [place] MRN: [id number] DOB: 25-Nov-1921 Today's Date: 02/01/2014 Time: [id number] SLP Time Calculation (min): 22 min  Assessment / Plan / Recommendation Clinical Impression  Pt participated very well with therapy today, remaining alert, sustaining attention to task and following multimodal cues well.  Attention redirected to midline and to the left visual field with moderate to max cues, pt able to sustain eye contact at midline for several minutes during therapeutic activities. Oral transit of swallowing still very impaired with pt self feeding multiple sips/bites with no oral transit or swallow response. SLP facilitated swallow trigger with total assist for slight posterior head tilt, though this resulted in wet vocal quality concerning for residue and silent aspiration. Continue to recommend alternative means of nutrition given that pt is likely to progress during SLP therapy over short term.    HPI HPI: 80 y.o. female with PMH of CAD, HTN, mild dementia, hypothyroidism was brought to the ER by EMS after her daughter found her down beside her bed - she had a facial droop and L hemiparesis, L neglect.  MRI confirmed acute nonhemorrhagic anterior right MCA territory infarct, remote posterior right MCA territory infarct.  Failed RN stroke swallow screen.     Pertinent Vitals Pain Assessment: Faces Faces Pain Scale: Hurts a little bit Pain Location: neck pain Pain Descriptors / Indicators: Sore Pain Intervention(s): Heat applied  SLP Plan  Continue with current plan of care    Recommendations Diet recommendations: NPO Medication Administration: Via alternative means              General recommendations: Rehab consult Oral Care Recommendations: Oral care BID Follow up Recommendations: Inpatient Rehab Plan: Continue with current plan of care    GO    [facility] Residential Treatment CenterBonnie Catrice Zuleta, MA CCC-SLP  [phone]  Claudine MoutonDeBlois, Elin Fenley Caroline 02/01/2014, 1:13 PM

## 2014-02-01 NOTE — H&P (View-Only) (Signed)
Physical Medicine and Rehabilitation Admission H&P    Chief Complaint  Patient presents with  . Stroke Symptoms  : HPI: Sarah Daniel is a 80 y.o. right handed female with history of dementia maintained on Aricept, coronary artery disease, hypertension. By report patient lives with her daughter independent prior to admission and assistance as needed. Presented 01/29/2014 after being found down by her daughter with left-sided weakness and facial droop. Patient was a poor medical historian. MRI/MRA of the brain shows acute nonhemorrhagic anterior right MCA territory infarct as well as occlusion of the right internal carotid artery with partial reconstitution of the cavernous and ophthalmic segments. CT angiogram of the neck again showing right internal carotid artery occlusion as well as proximal left subclavian artery occlusion less than 10 mm from the aortic arch. Patient did not receive TPA. Echocardiogram with ejection fraction of 60% grade 1 diastolic dysfunction. EEG with mild diffuse slowing of the waking background without seizure activity. Neurology service  consulted maintained on aspirin for CVA prophylaxis as well as subcutaneous Lovenox for DVT prophylaxis. Swallow evaluation per speech therapy 01/30/2014 patient currently nothing by mouth with nasogastric tube in place for nutritional support Physical occupational therapy evaluations completed with recommendations of physical medicine rehabilitation consult. Patient was admitted for comprehensive rehabilitation program   Review of Systems  Respiratory: Positive for shortness of breath.    Review of Systems  Unable due to mental acuity and language   Past Medical History  Diagnosis Date  . Dementia   . Hypertension   . GERD (gastroesophageal reflux disease)   . Thyroid disease   . PUD (peptic ulcer disease)   . Coronary artery disease   . Hypothyroidism   . Arthritis    Past Surgical History  Procedure Laterality Date  .  Abdominal hysterectomy     History reviewed. No pertinent family history. Social History:  reports that she has quit smoking. She has never used smokeless tobacco. She reports that she does not drink alcohol or use illicit drugs. Allergies: No Known Allergies Medications Prior to Admission  Medication Sig Dispense Refill  . atorvastatin (LIPITOR) 40 MG tablet Take 40 mg by mouth daily.      . cholecalciferol (VITAMIN D) 1000 UNITS tablet Take 1,000 Units by mouth daily.      Marland Kitchen donepezil (ARICEPT) 10 MG tablet Take 10 mg by mouth daily.       . IRON PO Take 1 tablet by mouth daily.      Marland Kitchen levothyroxine (SYNTHROID, LEVOTHROID) 50 MCG tablet Take 50 mcg by mouth daily before breakfast.      . venlafaxine XR (EFFEXOR-XR) 75 MG 24 hr capsule Take 75 mg by mouth daily with breakfast.        Home: Home Living Family/patient expects to be discharged to:: Private residence Living Arrangements: Children (daughter) Available Help at Discharge: Family;Available PRN/intermittently Type of Home: House Home Access: Level entry Home Layout: One level;Able to live on main level with bedroom/bathroom Home Equipment: Gilmer Mor - single point Additional Comments: walk-in shower  Lives With: Daughter   Functional History: Prior Function Level of Independence: Independent Comments: pt. overall very independent, at home up to 4 hours without supervision  Functional Status:  Mobility: Bed Mobility Overal bed mobility: Needs Assistance Bed Mobility: Rolling;Sidelying to Sit Rolling: Min assist Sidelying to sit: Min assist (with rail) Supine to sit: Mod assist Sit to supine: Mod assist Sit to sidelying: Mod assist General bed mobility comments: min assist to find  LUE with Rt and bring across body; pt then used RUE and rail to roll and come to sit; min assist for LLe guidance over EOB (inattention) Transfers Overall transfer level: Needs assistance Equipment used: 1 person hand held assist Transfers:  Sit to/from UGI CorporationStand;Stand Pivot Transfers Sit to Stand: Mod assist Stand pivot transfers: Mod assist General transfer comment: assist to obtain proper position pror to stand; pt with lean to her Left; good weightbearing through LLE Ambulation/Gait Ambulation/Gait assistance: Mod assist Ambulation Distance (Feet): 40 Feet Assistive device:  (RUE pushing IV pole) Gait Pattern/deviations: Step-to pattern;Decreased step length - right;Decreased stance time - left;Decreased weight shift to right;Steppage;Wide base of support (Lt knee hyperextension) Gait velocity interpretation: <1.8 ft/sec, indicative of risk for recurrent falls General Gait Details: bearing weight on LLE without buckling, however required assist to prevent knee hyperextension and occasional placement assist at pelvis and ribs for weightshifting/balance; pt easily distracted and with much difficulty turning to her Left    ADL: ADL Overall ADL's : Needs assistance/impaired Grooming: Brushing hair;Moderate assistance (applying lotion) Upper Body Bathing: Sitting;Moderate assistance Lower Body Bathing: Maximal assistance;Sit to/from stand Upper Body Dressing : Sitting;Maximal assistance Lower Body Dressing: Maximal assistance;Sit to/from stand Toilet Transfer: Stand-pivot;BSC;Maximal assistance Toileting- Clothing Manipulation and Hygiene: Bed level;Maximal assistance;Sitting/lateral lean Functional mobility during ADLs: Maximal assistance (stand pivot) General ADL Comments: Recommended family sit on left side to try to get pt to look to left. Pt incontinent of stool when OT arrived. Assisted in cleaning pt. Discussed with family member safety/positioning of LUE and to have it elevated on pillow. Discussed CIR with family member. Pt with decreased balance sitting EOB-leaning back at times.  Cognition: Cognition Overall Cognitive Status: Impaired/Different from baseline Arousal/Alertness: Lethargic Orientation Level: Oriented to  person;Oriented to place;Oriented to situation;Disoriented to time Attention: Focused Focused Attention: Appears intact Memory: Impaired Memory Impairment: Storage deficit (not tested, suspect impairment due to low level attention) Awareness: Impaired Awareness Impairment: Intellectual impairment;Emergent impairment Problem Solving: Impaired Problem Solving Impairment: Verbal basic Executive Function: Initiating;Self Monitoring Initiating: Impaired Initiating Impairment: Verbal basic;Functional basic Self Monitoring: Impaired Self Monitoring Impairment: Verbal basic;Functional basic Cognition Arousal/Alertness: Awake/alert Behavior During Therapy: Restless;Impulsive Overall Cognitive Status: Impaired/Different from baseline Area of Impairment: Attention;Safety/judgement;Following commands Current Attention Level: Sustained Following Commands: Follows one step commands inconsistently Safety/Judgement: Decreased awareness of safety;Decreased awareness of deficits Difficult to assess due to: Impaired communication;Level of arousal  Physical Exam: Blood pressure 150/83, pulse 83, temperature 98 F (36.7 C), temperature source Oral, resp. rate 18, height 4\' 9"  (1.448 m), weight 40.688 kg (89 lb 11.2 oz), SpO2 97.00%. Physical Exam Pt lying in bed with head turned to the right HENT:  [name] tube pulled out. Oral mucosa appears generally pink/moist Eyes:  Pupils reactive to light  Neck: Normal range of motion. Neck supple. No thyromegaly present.  Cardiovascular: Normal rate and regular rhythm. No murmurs Respiratory: Effort normal and breath sounds normal. No respiratory distress. No wheezes, rales GI: Soft. Bowel sounds are normal. She exhibits no distension.  Neurological: She is alert.  Patient displays a severe left inattention. (neck/SCM is actually tight due to severe rotation to right). Will not maintain neutral even when head passively rotated to neutral. She would follow some  inconsistent commands. She did attempt mouth most words but mostly unintelligible. Makes little eye contact. 0/5  LUE.  LLE tr to 1/5 HF,KE, 0/5 Left ADF/APF. RUE grossly 3- to 3/5 proximal to distal. LLE similar at 3- to 3/5.Marland Kitchen. Does not react to painful stimulation LUE  or LLE. DTR's 3+ LLE. Toes up.   Skin: Skin is warm and dry.  Psychiatric:  Flat, still does not engage   Results for orders placed during the hospital encounter of 01/29/14 (from the past 48 hour(s))  GLUCOSE, CAPILLARY     Status: None   Collection Time    01/31/14  4:17 PM      Result Value Ref Range   Glucose-Capillary 99  70 - 99 mg/dL  GLUCOSE, CAPILLARY     Status: Abnormal   Collection Time    01/31/14  9:13 PM      Result Value Ref Range   Glucose-Capillary 102 (*) 70 - 99 mg/dL  GLUCOSE, CAPILLARY     Status: None   Collection Time    02/01/14 12:00 AM      Result Value Ref Range   Glucose-Capillary 97  70 - 99 mg/dL  GLUCOSE, CAPILLARY     Status: None   Collection Time    02/01/14  4:11 AM      Result Value Ref Range   Glucose-Capillary 95  70 - 99 mg/dL   Ct Angio Neck W/cm &/or Wo/cm  01/30/2014   CLINICAL DATA:  Acute right MCA territory infarct. Right internal carotid artery occlusion.  EXAM: CT ANGIOGRAPHY NECK  TECHNIQUE: Multidetector CT imaging of the neck was performed using the standard protocol during bolus administration of intravenous contrast. Multiplanar CT image reconstructions and MIPs were obtained to evaluate the vascular anatomy. Carotid stenosis measurements (when applicable) are obtained utilizing NASCET criteria, using the distal internal carotid diameter as the denominator.  CONTRAST:  50 mL Omnipaque 350  COMPARISON:  MRI brain and MRA head 01/29/2014  FINDINGS: There is a common origin of the left common carotid artery and the innominate artery. Dense atherosclerotic calcifications are present in the distal arch. The subclavian artery is occluded less than 1 cm from the origin. It is  reconstituted at the level of the left vertebral artery.  The vertebral arteries originate from the subclavian arteries bilaterally. Calcifications are present at the origin of the vertebral arteries without significant stenosis. There is marked tortuosity in the proximal right vertebral artery with a 50% stenosis. Contrast density is less and the proximal left vertebral artery been in the right compatible with a subclavian steal phenomenon. There are no significant vertebral artery stenoses in the neck. Atherosclerotic irregularity is present within the distal vertebral arteries bilaterally, left greater than right without significant stenosis. The PICA origins are visualized and normal.  The right common carotid artery is tortuous proximally without significant stenosis. The right internal carotid artery is occluded at its origin. There is no reconstitution in the neck. The artery is reconstituted within the cavernous segments, likely from the ophthalmic artery. Dense calcifications are present within the cavernous right internal carotid artery.  The left common carotid artery is within normal limits. The bifurcation demonstrates atherosclerotic change without a significant stenosis relative to the distal vessel. The cervical and proximal intracranial left ICA is unremarkable.  Limited imaging of the brain again demonstrates the right MCA territory infarcts.  There is fusion of posterior elements at C2-3. Anterolisthesis at C4-5 and C5-6 appears degenerative with advanced facet disease at both levels. There is chronic loss of height and endplate changes at C6-7 and to lesser extent at C7-T1. No acute osseous abnormality is present.  Review of the MIP images confirms the above findings.  IMPRESSION: 1. The right internal carotid artery is occluded at the bifurcation. It is  reconstituted within the cavernous segment, likely from the right ophthalmic artery. 2. The proximal left subclavian artery is occluded less  than 10 mm from the aortic arch. It is reconstituted at the level of the left vertebral artery, likely from retrograde flow is discussed. 3. Atherosclerotic changes within the left carotid artery bifurcation without significant stenosis. 4. Atherosclerotic changes within the vertebral arteries above the dural margin bilaterally without significant stenoses. 5. Advanced spondylosis of the cervical spine.   Electronically Signed   By: Gennette Pachris  Mattern M.D.   On: 01/30/2014 13:09       Medical Problem List and Plan: 1. Functional deficits secondary to right MCA infarct with dense left hemiparesis and visual-spatial deficits 2.  DVT Prophylaxis/Anticoagulation: Subcutaneous Lovenox. Monitor platelet counts and any signs of bleeding 3. Pain Management: Ultram as needed. 4. Mood/depression/dementia. Aricept 10 mg daily at bedtime, Effexor 75 mg daily. Discussed baseline cognition with family 5. Neuropsych: This patient is not capable of making decisions on her own behalf. 6. Skin/Wound Care: Routine skin checks as well as routine turning to maintain skin integrity 7. Fluids/Electrolytes/Nutrition: Follow-up chemistries. Strict I and O's. 8. Dysphagia. Nasogastric tube feeds. Follow-up speech therapy. IV fluids to maintain hydration  -panda will need to be replaced today 9. Hypothyroidism. Synthroid. Latest TSH level 2.929 10. GERD. Protonix   Post Admission Physician Evaluation: 1. Functional deficits secondary  to right MCA infarct with dense left hemiparesis and visual-spatial deficits. 2. Patient is admitted to receive collaborative, interdisciplinary care between the physiatrist, rehab nursing staff, and therapy team. 3. Patient's level of medical complexity and substantial therapy needs in context of that medical necessity cannot be provided at a lesser intensity of care such as a SNF. 4. Patient has experienced substantial functional loss from his/her baseline which was documented above under  the "Functional History" and "Functional Status" headings.  Judging by the patient's diagnosis, physical exam, and functional history, the patient has potential for functional progress which will result in measurable gains while on inpatient rehab.  These gains will be of substantial and practical use upon discharge  in facilitating mobility and self-care at the household level. 5. Physiatrist will provide 24 hour management of medical needs as well as oversight of the therapy plan/treatment and provide guidance as appropriate regarding the interaction of the two. 6. 24 hour rehab nursing will assist with bladder management, bowel management, safety, skin/wound care, disease management, medication administration, pain management and patient education  and help integrate therapy concepts, techniques,education, etc. 7. PT will assess and treat for/with: Lower extremity strength, range of motion, stamina, balance, functional mobility, safety, adaptive techniques and equipment, NMR, visual spatial and cognitive perceptual awareness, skin care, pain mgt, ego support.   Goals are: min to mod assist. 8. OT will assess and treat for/with: ADL's, functional mobility, safety, upper extremity strength, adaptive techniques and equipment, NMR, cognitive and visual perceptual awareness.   Goals are: min to mod assist. Therapy may not proceed with showering this patient. 9. SLP will assess and treat for/with: speech, language, communication, family ed.  Goals are: mod assist. 10. Case Management and Social Worker will assess and treat for psychological issues and discharge planning. 11. Team conference will be held weekly to assess progress toward goals and to determine barriers to discharge. 12. Patient will receive at least 3 hours of therapy per day at least 5 days per week. 13. ELOS: 22-30 days       14. Prognosis:  good     Earna CoderZachary  Ermalene Postin, MD, [facility] Physical Medicine &  Rehabilitation 02/01/2014   02/01/2014

## 2014-02-01 NOTE — Progress Notes (Signed)
Physical Therapy Treatment Patient Details Name: Sarah Daniel MRN: [id number] DOB: 12-12-1921 Today's Date: 02/01/2014    History of Present Illness Adm 01/29/14 after found down at home with Lt sided weakness. + Rt frontoparietal CVA PMHx- HTN, dementia, CAD    PT Comments    Patient continues to have distractions and impulsive behaviors with mobility. This session patient attempting to step on bottom of IV pole with gait. Patient was soiled in bed upon entering room and required total A to stand and provide support with pericare. Would benefit from second person for chair follow and allow optimal hands on for facilitation of weight shifting and safety with gait. Continue to recommend comprehensive inpatient rehab (CIR) for post-acute therapy needs.   Follow Up Recommendations  CIR;Supervision/Assistance - 24 hour     Equipment Recommendations   (TBA)    Recommendations for Other Services       Precautions / Restrictions Precautions Precautions: Fall Restrictions Weight Bearing Restrictions: No Other Position/Activity Restrictions: monitor LUE due to weakness and inattention    Mobility  Bed Mobility Overal bed mobility: Needs Assistance   Rolling: Min assist Sidelying to sit: Min assist       General bed mobility comments: Patient utilized rail to roll and come up into sitting. A for trunk support and RUE.   Transfers Overall transfer level: Needs assistance Equipment used: 1 person hand held assist   Sit to Stand: Mod assist         General transfer comment: assist to obtain proper position pror to stand; pt with lean to her left side. A to gain midline and balance with initial stand and to control sitting back onto surface. Stood x4 throughout session  Ambulation/Gait Ambulation/Gait assistance: Max Environmental consultantassist Ambulation Distance (Feet): 30 Feet   Gait Pattern/deviations: Step-to pattern;Drifts right/left;Decreased weight shift to right;Decreased dorsiflexion  - left;Decreased step length - left;Steppage;Wide base of support   Gait velocity interpretation: <1.8 ft/sec, indicative of risk for recurrent falls General Gait Details: Patient unsafe with use of IV pole this session as it served as a distraction and patient attempting to step on bottom of IV pole. Max asssit to maintain balance and prevent fall. Patient required facilitation of R weight shift and L step.   Stairs            Wheelchair Mobility    Modified Rankin (Stroke Patients Only)       Balance     Sitting balance-Leahy Scale: Poor       Standing balance-Leahy Scale: Poor                      Cognition Arousal/Alertness: Awake/alert Behavior During Therapy: Restless;Impulsive Overall Cognitive Status: Impaired/Different from baseline Area of Impairment: Attention;Safety/judgement;Following commands   Current Attention Level: Sustained   Following Commands: Follows one step commands inconsistently Safety/Judgement: Decreased awareness of safety;Decreased awareness of deficits          Exercises      General Comments        Pertinent Vitals/Pain Pain Location: Complain of neck pain but did not rate Pain Descriptors / Indicators: Sore Pain Intervention(s): Heat applied    Home Living                      Prior Function            PT Goals (current goals can now be found in the care plan section) Progress towards PT goals: Progressing toward goals  Frequency  Min 4X/week    PT Plan Current plan remains appropriate    Co-evaluation             End of Session   Activity Tolerance: Patient tolerated treatment well Patient left: in chair;with chair alarm set;with call bell/phone within reach;with family/visitor present;Other (comment)     Time: [id number] PT Time Calculation (min): 39 min  Charges:  $Gait Training: 8-22 mins $Therapeutic Activity: 23-37 mins                    G Codes:      Fredrich BirksRobinette, Julia  Elizabeth 02/01/2014, 11:42 AM 02/01/2014 Fredrich Birksobinette, Julia Elizabeth PTA [phone] pager [phone] office

## 2014-02-02 ENCOUNTER — Inpatient Hospital Stay (HOSPITAL_COMMUNITY): Payer: Medicare Other

## 2014-02-02 ENCOUNTER — Inpatient Hospital Stay (HOSPITAL_COMMUNITY): Payer: Medicare Other | Admitting: Physical Therapy

## 2014-02-02 ENCOUNTER — Inpatient Hospital Stay (HOSPITAL_COMMUNITY): Payer: Medicare Other | Admitting: Speech Pathology

## 2014-02-02 ENCOUNTER — Inpatient Hospital Stay (HOSPITAL_COMMUNITY): Payer: Medicare Other | Admitting: Occupational Therapy

## 2014-02-02 DIAGNOSIS — F0390 Unspecified dementia without behavioral disturbance: Secondary | ICD-10-CM

## 2014-02-02 DIAGNOSIS — I63511 Cerebral infarction due to unspecified occlusion or stenosis of right middle cerebral artery: Secondary | ICD-10-CM

## 2014-02-02 DIAGNOSIS — R414 Neurologic neglect syndrome: Secondary | ICD-10-CM

## 2014-02-02 LAB — CBC WITH DIFFERENTIAL/PLATELET
Basophils Absolute: 0 10*3/uL (ref 0.0–0.1)
Basophils Relative: 0 % (ref 0–1)
EOS ABS: 0 10*3/uL (ref 0.0–0.7)
Eosinophils Relative: 0 % (ref 0–5)
HCT: 33.4 % — ABNORMAL LOW (ref 36.0–46.0)
Hemoglobin: 10.7 g/dL — ABNORMAL LOW (ref 12.0–15.0)
Lymphocytes Relative: 11 % — ABNORMAL LOW (ref 12–46)
Lymphs Abs: 0.9 10*3/uL (ref 0.7–4.0)
MCH: 29.2 pg (ref 26.0–34.0)
MCHC: 32 g/dL (ref 30.0–36.0)
MCV: 91 fL (ref 78.0–100.0)
MONO ABS: 0.7 10*3/uL (ref 0.1–1.0)
Monocytes Relative: 9 % (ref 3–12)
NEUTROS PCT: 80 % — AB (ref 43–77)
Neutro Abs: 6.4 10*3/uL (ref 1.7–7.7)
PLATELETS: ADEQUATE 10*3/uL (ref 150–400)
RBC: 3.67 MIL/uL — ABNORMAL LOW (ref 3.87–5.11)
RDW: 15.1 % (ref 11.5–15.5)
WBC: 8 10*3/uL (ref 4.0–10.5)

## 2014-02-02 LAB — COMPREHENSIVE METABOLIC PANEL
ALT: 25 U/L (ref 0–35)
AST: 31 U/L (ref 0–37)
Albumin: 3.2 g/dL — ABNORMAL LOW (ref 3.5–5.2)
Alkaline Phosphatase: 78 U/L (ref 39–117)
Anion gap: 19 — ABNORMAL HIGH (ref 5–15)
BUN: 13 mg/dL (ref 6–23)
CALCIUM: 8.6 mg/dL (ref 8.4–10.5)
CO2: 19 mEq/L (ref 19–32)
Chloride: 99 mEq/L (ref 96–112)
Creatinine, Ser: 0.67 mg/dL (ref 0.50–1.10)
GFR calc Af Amer: 87 mL/min — ABNORMAL LOW (ref >90)
GFR calc non Af Amer: 75 mL/min — ABNORMAL LOW (ref >90)
Glucose, Bld: 98 mg/dL (ref 70–99)
Potassium: 3.3 mEq/L — ABNORMAL LOW (ref 3.7–5.3)
SODIUM: 137 meq/L (ref 137–147)
Total Bilirubin: 0.5 mg/dL (ref 0.3–1.2)
Total Protein: 6.6 g/dL (ref 6.0–8.3)

## 2014-02-02 LAB — GLUCOSE, CAPILLARY
Glucose-Capillary: 94 mg/dL (ref 70–99)
Glucose-Capillary: 88 mg/dL (ref 70–99)

## 2014-02-02 MED ORDER — IOHEXOL 300 MG/ML  SOLN
50.0000 mL | Freq: Once | INTRAMUSCULAR | Status: AC | PRN
  Administered 2014-02-02: 30 mL

## 2014-02-02 MED ORDER — FREE WATER
150.0000 mL | Freq: Three times a day (TID) | Status: DC
  Administered 2014-02-02 – 2014-02-11 (×24): 150 mL

## 2014-02-02 MED ORDER — JEVITY 1.2 CAL PO LIQD
1000.0000 mL | ORAL | Status: DC
  Administered 2014-02-03 – 2014-02-05 (×4): 1000 mL
  Administered 2014-02-06: 17:00:00
  Administered 2014-02-07 – 2014-02-10 (×4): 1000 mL
  Filled 2014-02-02 (×18): qty 1000

## 2014-02-02 MED ORDER — POTASSIUM CHLORIDE 20 MEQ/15ML (10%) PO LIQD
20.0000 meq | Freq: Two times a day (BID) | ORAL | Status: AC
  Administered 2014-02-02 – 2014-02-03 (×4): 20 meq
  Filled 2014-02-02 (×4): qty 15

## 2014-02-02 NOTE — Care Management Note (Signed)
[facility] Individual Statement of Services  Patient Name:  Sarah Daniel  Date:  02/02/2014  Welcome to the [facility].  Our goal is to provide you with an individualized program based on your diagnosis and situation, designed to meet your specific needs.  With this comprehensive rehabilitation program, you will be expected to participate in at least 3 hours of rehabilitation therapies Monday-Friday, with modified therapy programming on the weekends.  Your rehabilitation program will include the following services:  Physical Therapy (PT), Occupational Therapy (OT), Speech Therapy (ST), 24 hour per day rehabilitation nursing, Case Management (Social Worker), Rehabilitation Medicine, Nutrition Services and Pharmacy Services  Weekly team conferences will be held on Wednesday to discuss your progress.  Your Social Worker will talk with you frequently to get your input and to update you on team discussions.  Team conferences with you and your family in attendance may also be held.  Expected length of stay: 25-28 days  Overall anticipated outcome: min/mod assist level  Depending on your progress and recovery, your program may change. Your Social Worker will coordinate services and will keep you informed of any changes. Your Social Worker's name and contact numbers are listed  below.  The following services may also be recommended but are not provided by the [facility]:    Home Health Rehabiltiation Services  Outpatient Rehabilitation Services    Arrangements will be made to provide these services after discharge if needed.  Arrangements include referral to agencies that provide these services.  Your insurance has been verified to be:  [employer] medicare Your primary doctor is:  Kirby FunkJohn Griffin  Pertinent information will be shared with your doctor and your insurance company.  Social Worker:  Dossie DerBecky Layla Kesling, SW [phone] or ([phone])  [phone]  Information discussed with and copy given to patient by: Lucy Chrisupree, Pearle Wandler G, 02/02/2014, 1:52 PM

## 2014-02-02 NOTE — Progress Notes (Signed)
INITIAL NUTRITION ASSESSMENT  DOCUMENTATION CODES Per approved criteria  -Not Applicable   INTERVENTION: Increase TF of Jevity 1.2 via NGT to new goal rate of 60 ml/hr over 20 hours (in anticipation of therapy) to provide 1440 kcal, 67 grams of protein, and 972 ml of free water.  Provide 150 ml of free water 3 times daily. Total water daily: 1422 ml  TF regimen is providing 100% of estimated nutrition needs.  Will continue to monitor.  NUTRITION DIAGNOSIS: Inadequate oral intake related to inability to eat as evidenced by NPO status  Goal: Pt to meet >/= 90% of their estimated nutrition needs   Monitor:  TF tolerance, weight trends, labs, I/O's  Reason for Assessment: MD consult for enteral/tube feeding initiation and management  80 y.o. female  Admitting Dx: CVA  ASSESSMENT: Pt with history of dementia, coronary artery disease, hypertension. By report patient lives with her daughter independent prior to admission and assistance as needed. Presented 01/29/2014 after being found down by her daughter with left-sided weakness and facial droop. MRI/MRA of the brain shows acute nonhemorrhagic anterior right MCA territory infarct as well as occlusion of the right internal carotid artery with partial reconstitution of the cavernous and ophthalmic segments.  Jevity 1.2 is currently infusing at 45 ml/hr over 20 hours to provide 1080 kcal, 50 grams of protein, and 729 ml of free water.  Pt had feeding tube placed this AM per RN. Pt was asleep during time of visit and would not wake. Pt was seen by a RD during acute hospitalization. During acute hospitalization, pt with a poor appetite however has been eating well with 2-3 meals daily. Unable to obtain other nutrition hx at this time. Will ask pt during next visit.    Nutrition Focused Physical Exam:   Subcutaneous Fat:  Orbital Region: wnl  Upper Arm Region: mild wasting  Thoracic and Lumbar Region: NA   Muscle:  Temple Region:  mild wasting  Clavicle Bone Region: moderate wasting  Clavicle and Acromion Bone Region: moderate wasting  Scapular Bone Region: NA  Dorsal Hand: moderate wasting  Patellar Region: mild wasting  Anterior Thigh Region: moderate wasting  Posterior Calf Region: wnl   Edema: none noted  Labs: Low potassium and GFR.  Height: Ht Readings from Last 1 Encounters:  01/30/14 4\' 9"  (1.448 m)    Weight: Wt Readings from Last 1 Encounters:  02/02/14 87 lb 8.4 oz (39.7 kg)    Ideal Body Weight: 95 lbs  % Ideal Body Weight: 92%  Wt Readings from Last 10 Encounters:  02/02/14 87 lb 8.4 oz (39.7 kg)  02/01/14 89 lb 11.2 oz (40.688 kg)  03/09/13 100 lb 12 oz (45.7 kg)    Usual Body Weight: 90 lbs  % Usual Body Weight: 97%  BMI:  Body mass index is 18.93 kg/(m^2).  Estimated Nutritional Needs: Kcal: 1300-1500 Protein: 55-65 grams  Fluid: >/=1.5 L/day  Skin: intact  Diet Order: NPO  EDUCATION NEEDS: -Education not appropriate at this time  No intake or output data in the 24 hours ending 02/02/14 0936  Last BM: 10/25  Labs:   Recent Labs Lab 01/29/14 1542 01/29/14 1555 02/01/14 2022 02/02/14 0726  NA 138 139  --  137  K 4.3 4.1  --  3.3*  CL 101 104  --  99  CO2 24  --   --  19  BUN 23 23  --  13  CREATININE 0.90 0.90 0.62 0.67  CALCIUM 9.4  --   --  8.6  GLUCOSE 154* 152*  --  98    CBG (last 3)   Recent Labs  02/01/14 2356 02/02/14 0429 02/02/14 0654  GLUCAP 106* 94 88    Scheduled Meds: . aspirin  300 mg Rectal Daily   Or  . aspirin  325 mg Oral Daily  . chlorhexidine  15 mL Mouth Rinse BID  . donepezil  10 mg Oral QHS  . enoxaparin (LOVENOX) injection  30 mg Subcutaneous Q24H  . levothyroxine  50 mcg Oral QAC breakfast  . pantoprazole (PROTONIX) IV  40 mg Intravenous Q24H  . potassium chloride  20 mEq Per Tube BID  . venlafaxine XR  75 mg Oral Q breakfast    Continuous Infusions: . sodium chloride 60 mL/hr at 02/01/14 1840  . feeding  supplement (JEVITY 1.2 CAL)      Past Medical History  Diagnosis Date  . Dementia   . Hypertension   . GERD (gastroesophageal reflux disease)   . Thyroid disease   . PUD (peptic ulcer disease)   . Coronary artery disease   . Hypothyroidism   . Arthritis     Past Surgical History  Procedure Laterality Date  . Abdominal hysterectomy      Marijean NiemannStephanie La, MS, RD, LDN Pager # [phone] After hours/ weekend pager # [phone]

## 2014-02-02 NOTE — Evaluation (Signed)
The assessment and plan has been reviewed and SLP is in agreement. Tanmay Halteman, M.A., CCC-SLP [phone]  

## 2014-02-02 NOTE — Progress Notes (Signed)
Andrey FarmerJanine L Cortez [facility] Admission Coordinator Signed Physical Medicine and Rehabilitation PMR Pre-admission Service date: 01/31/2014 3:00 PM  Related encounter: [facility]   PMR Admission Coordinator Pre-Admission Assessment  Patient: Sarah Daniel is an 80 y.o., female  MRN: [id number]  DOB: Mar 12, 1922  Height: 4\' 9"  (144.8 cm)  Weight: 40.688 kg (89 lb 11.2 oz)  Insurance Information  HMO: yes PPO: PCP: IPA: 80/20: OTHER:  PRIMARY: Blue Medicare Policy#: [id number] Subscriber: self  CM Name: Eugenio HoesCedric Clark, RN Phone#: [phone] Fax#: [phone]  Verbal authorization given from Cedric on 02-01-14. Updates due to Cedric on  Pre-Cert#: Employer: retired  Benefits: Phone #: [phone] Name: verified benefits online  Eff. Date: 04-08-13 Deduct: none Out of Pocket Max: $4500 [id number]($4367.87 remaining)  Life Max: none  CIR: $250/day for days 1-6, pre-auth needed SNF: $0/day for days 1-20; $60/day for days 21-40, $0/day for days 41-100 (100 day visit limit, pre-auth needed)  Outpatient: 100% Co-Pay: $35 copay/visit  Home Health: 100% Co-Pay: none  DME: 80% Co-Pay: 20%  Providers: in network   Emergency Contact Information    Contact Information     Name  Relation  Home  Work  Mobile     Dailey,Dee  Daughter  [phone]   [phone]        Current Medical History  Patient Admitting Diagnosis: Right MCA infarct with dense left hemiparesis and visual spatial deficits  History of Present Illness: Sarah Daniel is a 80 y.o. right handed female with history of dementia maintained on Aricept, coronary artery disease, hypertension. By report patient lives with her daughter independent prior to admission and assistance as needed. Presented 01/29/2014 after being found down by her daughter with left-sided weakness and facial droop. Patient was a poor medical historian. MRI/MRA of the brain shows acute nonhemorrhagic  anterior right MCA territory infarct as well as occlusion of the right internal carotid artery with partial reconstitution of the cavernous and ophthalmic segments. CT angiogram of the neck again showing right internal carotid artery occlusion as well as proximal left subclavian artery occlusion less than 10 mm from the aortic arch. Patient did not receive TPA. Echocardiogram with ejection fraction of 60% grade 1 diastolic dysfunction. EEG is pending. Neurology service is consulted maintained on aspirin for CVA prophylaxis as well as subcutaneous Lovenox for DVT prophylaxis. Swallow evaluation per speech therapy 01/30/2014 patient currently nothing by mouth with question plan nasogastric tube versus PEG tube. Physical occupational therapy evaluations completed with recommendations of physical medicine rehabilitation consult.  NIH Total: 15  Past Medical History    Past Medical History    Diagnosis  Date    .  Dementia     .  Hypertension     .  GERD (gastroesophageal reflux disease)     .  Thyroid disease     .  PUD (peptic ulcer disease)     .  Coronary artery disease     .  Hypothyroidism     .  Arthritis      Family History  family history is not on file.  Prior Rehab/Hospitalizations: none  Current Medications  Current facility-administered medications:0.9 % sodium chloride infusion, , Intravenous, Continuous, Ripudeep K Rai, MD, Last Rate: 60 mL/hr at 01/31/14 2009; antiseptic oral rinse (CPC / CETYLPYRIDINIUM CHLORIDE 0.05%) solution 7 mL, 7 mL, Mouth Rinse, q12n4p, Zannie CovePreetha Joseph, MD, 7 mL at 01/31/14 1653; aspirin suppository 300 mg, 300 mg, Rectal, Daily, Zannie CovePreetha Joseph, MD, 300  mg at 01/30/14 [id number]  aspirin tablet 325 mg, 325 mg, Oral, Daily, Zannie CovePreetha Joseph, MD, 325 mg at 01/31/14 1050; chlorhexidine (PERIDEX) 0.12 % solution 15 mL, 15 mL, Mouth Rinse, BID, Zannie CovePreetha Joseph, MD, 15 mL at 01/31/14 2009; donepezil (ARICEPT) tablet 10 mg, 10 mg, Oral, QHS, Zannie CovePreetha Joseph, MD, 10 mg at 01/31/14  2259; enoxaparin (LOVENOX) injection 30 mg, 30 mg, Subcutaneous, Q24H, Zannie CovePreetha Joseph, MD, 30 mg at 01/31/14 2009  feeding supplement (JEVITY 1.2 CAL) liquid 1,000 mL, 1,000 mL, Per Tube, Continuous, Reanne Vista LawmanJ Barnett, RD; levothyroxine (SYNTHROID, LEVOTHROID) tablet 50 mcg, 50 mcg, Oral, QAC breakfast, Zannie CovePreetha Joseph, MD, 50 mcg at 01/31/14 0847; morphine 2 MG/ML injection 1-2 mg, 1-2 mg, Intravenous, Q4H PRN, Ripudeep K Rai, MD; pantoprazole (PROTONIX) injection 40 mg, 40 mg, Intravenous, Q24H, Ripudeep K Rai, MD, 40 mg at 01/31/14 1050  senna-docusate (Senokot-S) tablet 1 tablet, 1 tablet, Oral, QHS PRN, Zannie CovePreetha Joseph, MD; traMADol Janean Sark(ULTRAM) tablet 50 mg, 50 mg, Oral, Q6H PRN, Ripudeep K Rai, MD, 50 mg at 02/01/14 0510; venlafaxine XR (EFFEXOR-XR) 24 hr capsule 75 mg, 75 mg, Oral, Q breakfast, Zannie CovePreetha Joseph, MD, 75 mg at 01/31/14 0847  Patients Current Diet: NPO, having [name] placed on 02-01-14 in am.  Precautions / Restrictions  Precautions  Precautions: Fall  Restrictions  Weight Bearing Restrictions: No  Other Position/Activity Restrictions: monitor LUE due to weakness and inattention  Prior Activity Level  Limited Community (1-2x/wk): Pt got out on twice a week for the [facility] day [facility] and enjoyed "going to her job". Dtr states they just bumped her up to three days/week for this program. Dtr would drive her mom on occasional errands as well. Pt was independent with all mobility (no AD used) and ADL's.  Home Assistive Devices / Equipment  Home Assistive Devices/Equipment: Eyeglasses;Dentures (specify type) (upper denture)  Home Equipment: Cane - single point  Prior Functional Level  Prior Function  Level of Independence: Independent  Comments: pt. overall very independent, at home up to 4 hours without supervision  Current Functional Level    Cognition  Arousal/Alertness: Lethargic  Overall Cognitive Status: Impaired/Different from baseline  Difficult to assess due to: Impaired  communication;Level of arousal  Current Attention Level: Sustained  Orientation Level: Oriented to person;Oriented to place;Oriented to situation;Disoriented to time  Following Commands: Follows one step commands inconsistently  Safety/Judgement: Decreased awareness of safety;Decreased awareness of deficits  Attention: Focused  Focused Attention: Appears intact  Memory: Impaired  Memory Impairment: Storage deficit (not tested, suspect impairment due to low level attention)  Awareness: Impaired  Awareness Impairment: Intellectual impairment;Emergent impairment  Problem Solving: Impaired  Problem Solving Impairment: Verbal basic  Executive Function: Initiating;Self Monitoring  Initiating: Impaired  Initiating Impairment: Verbal basic;Functional basic  Self Monitoring: Impaired  Self Monitoring Impairment: Verbal basic;Functional basic    Extremity Assessment  (includes Sensation/Coordination)      ADLs  Overall ADL's : Needs assistance/impaired  Grooming: Brushing hair;Moderate assistance (applying lotion)  Upper Body Bathing: Sitting;Moderate assistance  Lower Body Bathing: Maximal assistance;Sit to/from stand  Upper Body Dressing : Sitting;Maximal assistance  Lower Body Dressing: Maximal assistance;Sit to/from stand  Toilet Transfer: Stand-pivot;BSC;Maximal assistance  Toileting- Clothing Manipulation and Hygiene: Bed level;Maximal assistance;Sitting/lateral lean  Functional mobility during ADLs: Maximal assistance (stand pivot)  General ADL Comments: Recommended family sit on left side to try to get pt to look to left. Pt incontinent of stool when OT arrived. Assisted in cleaning pt. Discussed with family member safety/positioning of LUE and to  have it elevated on pillow. Discussed CIR with family member. Pt with decreased balance sitting EOB-leaning back at times.    Mobility  Overal bed mobility: Needs Assistance  Bed Mobility: Rolling;Sidelying to Sit  Rolling: Min assist   Sidelying to sit: Min assist (with rail)  Supine to sit: Mod assist  Sit to supine: Mod assist  Sit to sidelying: Mod assist  General bed mobility comments: min assist to find LUE with Rt and bring across body; pt then used RUE and rail to roll and come to sit; min assist for LLe guidance over EOB (inattention)    Transfers  Overall transfer level: Needs assistance  Equipment used: 1 person hand held assist  Transfers: Sit to/from [employer]  Sit to Stand: Mod assist  Stand pivot transfers: Mod assist  General transfer comment: assist to obtain proper position pror to stand; pt with lean to her Left; good weightbearing through LLE    Ambulation / Gait / Stairs / Wheelchair Mobility  Ambulation/Gait  Ambulation/Gait assistance: Mod assist  Ambulation Distance (Feet): 40 Feet  Assistive device: (RUE pushing IV pole)  Gait Pattern/deviations: Step-to pattern;Decreased step length - right;Decreased stance time - left;Decreased weight shift to right;Steppage;Wide base of support (Lt knee hyperextension)  Gait velocity interpretation: <1.8 ft/sec, indicative of risk for recurrent falls  General Gait Details: bearing weight on LLE without buckling, however required assist to prevent knee hyperextension and occasional placement assist at pelvis and ribs for weightshifting/balance; pt easily distracted and with much difficulty turning to her Left    Posture / Balance  Dynamic Sitting Balance  Sitting balance - Comments: Lt lean, could correct to midline without assist at beginning of session; by the end, was fatigued and could initiate shift to midline and required assist to achieve    Special needs/care consideration  BiPAP/CPAP no  CPM no  Continuous Drip IV no  Dialysis no  Life Vest no  Oxygen no  Special Bed no  Trach Size no  Wound Vac (area) no  Skin no issues  Bowel mgmt: last BM on 01-30-14, incontinence noted  Bladder mgmt: incontinent  Diabetic mgmt - no, but dtr  states pt does have low blood sugars at times    Previous Home Environment  Living Arrangements: Children (daughter)  Lives With: Daughter  Available Help at Discharge: Family;Available PRN/intermittently  Type of Home: House  Home Layout: One level;Able to live on main level with bedroom/bathroom  Home Access: Level entry  Bathroom Shower/Tub: Walk-in shower  Home Care Services: No  Additional Comments: walk-in shower  Discharge Living Setting  Plans for Discharge Living Setting: Lives with (comment) (lives with dtr and son-in-law)  Type of Home at Discharge: House  Discharge Home Layout: One level  Discharge Home Access: Level entry  Does the patient have any problems obtaining your medications?: No  Social/Family/Support Systems  Patient Roles: Other (Comment) (goes to [facility] program twice a week - adult day care program)  Contact Information: dtr Geraldine Contras is primary contact  Anticipated Caregiver: Geraldine Contras and son-in-law  Anticipated Industrial/product designer Information: see above  Ability/Limitations of Caregiver: dtr is retired but does help in her grandchildren's school. Son-in-law is also retired and could assist pt. Dtr also stated they may look into hiring caregivers.  Caregiver Availability: Other (Comment) (will be available the majority of the time and son-in-law can also help)  Discharge Plan Discussed with Primary Caregiver: Yes  Is Caregiver In Agreement with Plan?: Yes  Does Caregiver/Family have Issues  with Lodging/Transportation while Pt is in Rehab?: No  Goals/Additional Needs  Patient/Family Goal for Rehab: Minimal to moderate assistance with PT, OT and SLP  Expected length of stay: 25-30 days  Cultural Considerations: pt is [employer]  Dietary Needs: currently NPO  Equipment Needs: to be determined  Pt/Family Agrees to Admission and willing to participate: Yes (spoke with dtr. Dtr spoke with her sister as well.)  Program Orientation Provided & Reviewed with Pt/Caregiver Including  Roles & Responsibilities: Yes  Decrease burden of Care through IP rehab admission: NA  Possible need for SNF placement upon discharge: possible, depends on pt's overall progress and amount of needed assist at the end of a rehab stay. Extensive discussion was had with pt's dtr explaining that rehab MD has projected that pt will need minimal to moderate assistance with PT, OT and SLP at the end of inpatient rehab. Dtr stated, "we will do what it takes to help her and cross that bridge when it comes."  Patient Condition: This patient's condition remains as documented in the consult dated 02-01-14, in which the Rehabilitation Physician determined and documented that the patient's condition is appropriate for intensive rehabilitative care in an inpatient rehabilitation facility. Will admit to inpatient rehab today.  Preadmission Screen Completed By: Juliann Mule, PT, 02/01/2014 11:13 AM  ______________________________________________________________________  Discussed status with Dr. Riley Kill on 02/01/14 at 1106 and received telephone approval for admission today.  Admission Coordinator: Juliann Mule, PT, time 1106/Date 02/01/14    Cosigned by: Ranelle Oyster, MD [02/01/2014 1:09 PM]

## 2014-02-02 NOTE — Progress Notes (Signed)
Social Work Patient ID: Sarah Daniel, female   DOB: 02/26/1922, 80 y.o.   MRN: [id number] Awaiting daughter's return call due to pt unable to provide information due to speech/anguage deficits from her stroke.

## 2014-02-02 NOTE — Progress Notes (Signed)
Patient ID: Sarah Daniel, female   DOB: 06/20/1921, 80 y.o.   MRN: [id number]  80 y.o. right handed female with history of dementia maintained on Aricept, coronary artery disease, hypertension. By report patient lives with her daughter independent prior to admission and assistance as needed. Presented 01/29/2014 after being found down by her daughter with left-sided weakness and facial droop. Patient was a poor medical historian. MRI/MRA of the brain shows acute nonhemorrhagic anterior right MCA territory infarct as well as occlusion of the right internal carotid artery with partial reconstitution of the cavernous and ophthalmic segments. CT angiogram of the neck again showing right internal carotid artery occlusion as well as proximal left subclavian artery occlusion less than 10 mm from the aortic arch. Patient did not receive TPA. Echocardiogram with ejection fraction of 54% grade 1 diastolic dysfunction. EEG with mild diffuse slowing of the waking background without seizure activity. Neurology service  consulted maintained on aspirin   Subjective/Complaints: Pt without new issues Headache since CVA onset  [name] out again, very little nutrition for last 4-5 days    Objective: Vital Signs: Blood pressure 135/83, pulse 104, temperature 98.6 F (37 C), temperature source Oral, resp. rate 18, weight 39.7 kg (87 lb 8.4 oz), SpO2 100.00%. Dg Abd 1 View  02/01/2014   CLINICAL DATA:  Evaluate feeding tube placement  EXAM: ABDOMEN - 1 VIEW  COMPARISON:  02/01/2014  FINDINGS: Feeding tube has been removed and replaced in the interval. However, the tip is now located within a right bronchus. Lungs are predominantly clear. Atherosclerotic vascular calcifications. Bowel gas pattern nonspecific, nonobstructive. Previously ingested contrast within the colon. Rightward curvature of the lower thoracic/ upper lumbar spine. Multilevel degenerative changes.  IMPRESSION: Feeding tube tip within a right lung bronchus.   Recommend removal.  Emergent finding and recommendation discussed with the patient's nurse, Conrad Marlinton, at 10:05 p.m. on 02/01/2014.   Electronically Signed   By: Carlos Levering M.D.   On: 02/01/2014 22:08   Dg Abd 1 View  02/01/2014   CLINICAL DATA:  Left-sided weakness.  New NG tube for feeding.  EXAM: ABDOMEN - 1 VIEW  COMPARISON:  02/01/2014  FINDINGS: The feeding tube tip is in the expected location of the distal stomach. Normal bowel gas pattern. The lungs appear clear. No airspace consolidation.  IMPRESSION: Tip of feeding tube is in the expected location of the distal stomach.   Electronically Signed   By: Kerby Moors M.D.   On: 02/01/2014 17:02   Dg Abd 1 View  02/01/2014   CLINICAL DATA:  80 year old female. Hypertension. Nausea and vomiting. Initial encounter.  EXAM: ABDOMEN - 1 VIEW  COMPARISON:  None.  Fluoroscopic time: 1 min and 9 seconds.  FINDINGS: [name] tube was placed by radiology technologist. C-arm submitted for review after procedure. This reveals that the tip of the [name] tube is in the region of the proximal jejunum just beyond the ligament of Treitz.  IMPRESSION: Tip of the [name] tube is in the region of the proximal jejunum just beyond the ligament of Treitz.   Electronically Signed   By: Chauncey Cruel M.D.   On: 02/01/2014 10:43   Dg Addison Bailey G Tube Plc W/fl-no Rad  02/01/2014   CLINICAL DATA:    NASO G TUBE PLACEMENT WITH FLUORO  Fluoroscopy was utilized by the requesting physician.  No radiographic  interpretation.    Results for orders placed during the hospital encounter of 02/01/14 (from the past 72 hour(s))  GLUCOSE, CAPILLARY  Status: None   Collection Time    02/01/14  8:07 PM      Result Value Ref Range   Glucose-Capillary 89  70 - 99 mg/dL   Comment 1 Notify RN     Comment 2 Documented in Chart    CBC     Status: Abnormal   Collection Time    02/01/14  8:22 PM      Result Value Ref Range   WBC 12.1 (*) 4.0 - 10.5 K/uL   RBC 3.96  3.87 - 5.11 MIL/uL    Hemoglobin 11.6 (*) 12.0 - 15.0 g/dL   HCT 36.1  36.0 - 46.0 %   MCV 91.2  78.0 - 100.0 fL   MCH 29.3  26.0 - 34.0 pg   MCHC 32.1  30.0 - 36.0 g/dL   RDW 15.1  11.5 - 15.5 %   Platelets 330  150 - 400 K/uL  CREATININE, SERUM     Status: Abnormal   Collection Time    02/01/14  8:22 PM      Result Value Ref Range   Creatinine, Ser 0.62  0.50 - 1.10 mg/dL   GFR calc non Af Amer 77 (*) >90 mL/min   GFR calc Af Amer 89 (*) >90 mL/min   Comment: (NOTE)     The eGFR has been calculated using the CKD EPI equation.     This calculation has not been validated in all clinical situations.     eGFR's persistently <90 mL/min signify possible Chronic Kidney     Disease.  GLUCOSE, CAPILLARY     Status: Abnormal   Collection Time    02/01/14 11:56 PM      Result Value Ref Range   Glucose-Capillary 106 (*) 70 - 99 mg/dL   Comment 1 Notify RN    GLUCOSE, CAPILLARY     Status: None   Collection Time    02/02/14  4:29 AM      Result Value Ref Range   Glucose-Capillary 94  70 - 99 mg/dL   Comment 1 Notify RN    GLUCOSE, CAPILLARY     Status: None   Collection Time    02/02/14  6:54 AM      Result Value Ref Range   Glucose-Capillary 88  70 - 99 mg/dL   Comment 1 Notify RN       HEENT: Left facial droop Cardio: RRR and no murmur Resp: CTA B/L and unlabored GI: BS positive and NT, ND Extremity:  No Edema Skin:   Bruise LUE and LLE Neuro: Confused, Flat, Cranial Nerve Abnormalities Left central VII, Abnormal Sensory Left hemisensory def, Abnormal Motor 2- biceps, shoulder add, 2- L HF, KE 0/5 Left ankle, Dysarthric and Inattention Musc/Skel:  Other no pain with A/PROM of UE and LE NAD, severe R gaze pref   Assessment/Plan: 1. 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 Physiatrist is providing close team supervision and 24 hour management of active  medical problems listed below. Physiatrist and rehab team continue to assess barriers to discharge/monitor patient progress toward functional and medical goals. FIM:                   Comprehension Comprehension Mode: Auditory Comprehension: 2-Understands basic 25 - 49% of the time/requires cueing 51 - 75% of the time  Expression Expression Mode: Verbal Expression: 1-Expresses basis less than 25% of  the time/requires cueing greater than 75% of the time.  Social Interaction Social Interaction: 1-Interacts appropriately less than 25% of the time. May be withdrawn or combative.  Problem Solving Problem Solving: 1-Solves basic less than 25% of the time - needs direction nearly all the time or does not effectively solve problems and may need a restraint for safety  Memory Memory: 1-Recognizes or recalls less than 25% of the time/requires cueing greater than 75% of the time  Medical Problem List and Plan: 1. Functional deficits secondary to right MCA infarct with dense left hemiparesis and visual-spatial deficits 2.  DVT Prophylaxis/Anticoagulation: Subcutaneous Lovenox. Monitor platelet counts and any signs of bleeding 3. Pain Management: Ultram as needed. 4. Mood/depression/dementia. Aricept 10 mg daily at bedtime, Effexor 75 mg daily. Discussed baseline cognition with family 5. Neuropsych: This patient is not capable of making decisions on her own behalf. 6. Skin/Wound Care: Routine skin checks as well as routine turning to maintain skin integrity 7. Fluids/Electrolytes/Nutrition: Follow-up chemistries. Strict I and O's.Hx CKD, monitor renal status 8. Dysphagia. Nasogastric tube feeds. Follow-up speech therapy. IV fluids to maintain hydration             -panda will need to be replaced today 9. Hypothyroidism. Synthroid. Latest TSH level 2.929 10. GERD. Protonix 11. Hx CAD, ASA    LOS (Days) 1 A FACE TO FACE EVALUATION WAS PERFORMED  KIRSTEINS,ANDREW E 02/02/2014, 7:40  AM

## 2014-02-02 NOTE — Progress Notes (Signed)
Occupational Therapy Assessment and Plan  Patient Details  Name: Sarah Daniel MRN: [id number] Date of Birth: [birth year]  OT Diagnosis: abnormal posture, cognitive deficits, disturbance of vision, hemiplegia affecting non-dominant side and muscle weakness (generalized) Rehab Potential: Rehab Potential: Good (for stated goals) ELOS: 25-28 days   Today's Date: 02/02/2014 OT Individual Time: 0900-1000 OT Individual Time Calculation (min): 60 min     Problem List:  Patient Active Problem List   Diagnosis Date Noted  . Stroke 01/29/2014  . CVA (cerebral infarction) 01/29/2014  . Hypothyroidism 01/29/2014  . Hypotension 03/08/2013  . Nausea & vomiting 03/08/2013  . Weakness generalized 03/08/2013  . Anemia 03/08/2013  . CKD (chronic kidney disease), stage III 03/08/2013  . Dementia   . Hypertension   . Thyroid disease   . Coronary artery disease     Past Medical History:  Past Medical History  Diagnosis Date  . Dementia   . Hypertension   . GERD (gastroesophageal reflux disease)   . Thyroid disease   . PUD (peptic ulcer disease)   . Coronary artery disease   . Hypothyroidism   . Arthritis    Past Surgical History:  Past Surgical History  Procedure Laterality Date  . Abdominal hysterectomy      Assessment & Plan Clinical Impression: Patient is a 80 y.o. year old female with history of dementia maintained on Aricept, coronary artery disease, hypertension. By report patient lives with her daughter independent prior to admission and assistance as needed. Presented 01/29/2014 after being found down by her daughter with left-sided weakness and facial droop. Patient was a poor medical historian. MRI/MRA of the brain shows acute nonhemorrhagic anterior right MCA territory infarct as well as occlusion of the right internal carotid artery with partial reconstitution of the cavernous and ophthalmic segments. CT angiogram of the neck again showing right internal carotid artery occlusion  as well as proximal left subclavian artery occlusion less than 10 mm from the aortic arch. Patient did not receive TPA. Echocardiogram with ejection fraction of 60% grade 1 diastolic dysfunction. EEG with mild diffuse slowing of the waking background without seizure activity. Neurology service consulted maintained on aspirin for CVA prophylaxis as well as subcutaneous Lovenox for DVT prophylaxis. Swallow evaluation per speech therapy 01/30/2014 patient currently nothing by mouth with nasogastric tube in place for nutritional support Physical occupational therapy evaluations completed with recommendations of physical medicine rehabilitation consult. Patient was admitted for comprehensive rehabilitation program. Patient transferred to CIR on 02/01/2014 .    Patient currently requires Mod A- Total A with basic self-care skills secondary to muscle weakness, decreased cardiorespiratoy endurance, impaired timing and sequencing, abnormal tone, decreased coordination and decreased motor planning, L inattention, decreased initiation, decreased attention, decreased awareness, decreased problem solving, decreased safety awareness, decreased memory and delayed processing and decreased sitting balance, decreased standing balance, decreased postural control, hemiplegia and decreased balance strategies.  Prior to hospitalization, patient could complete ADLs with independent .  Patient will benefit from skilled intervention to decrease level of assist with basic self-care skills prior to discharge Home with care partner vs. SNF.  Anticipate patient will require minimal physical assistance and follow up OT services to be determined..   Skilled Therapeutic Intervention Upon entering the room, pt seated in wheelchair with daughter present in the room. Pt with no c/o pain this session and is agreeable to OT session. OT educated pt and family on OT purpose, POC, and goals with them verbalizing understanding. Pt preformed bathing  and dressing seated in wheelchair  at sink side with Mod - max verbal cues for initiation and to attend to task. Pt perseverating on washing face and neck during session. Pt with severe R gaze preference and head rotated to R side. Toilet transfer with Max A as well as STS for LB bathing and dressing. LB dressing with total assist as pt assisted with ~ 15% of task. Toileting with total A as therapist performing clothing management and hygiene. Pt supine in bed with staff taking her down for procedure upon exiting the room.   OT Evaluation Precautions/Restrictions  Precautions Precautions: Fall Precaution Comments: L inattention - safety awareness for L UE Restrictions Weight Bearing Restrictions: No General Chart Reviewed: Yes Pain Pain Assessment Pain Assessment: No/denies pain Home Living/Prior Functioning Home Living Available Help at Discharge: Family;Available PRN/intermittently Type of Home: House Home Access: Level entry Home Layout: One level;Able to live on main level with bedroom/bathroom Additional Comments: walk-in shower  Lives With: Daughter IADL History Homemaking Responsibilities: No Current License: No Occupation: Retired Prior Function Level of Independence: Independent with basic ADLs Vision/Perception  Vision- Assessment Vision Assessment?: Vision impaired- to be further tested in functional context R gaze preference with head rotated to the R  Cognition Overall Cognitive Status: Impaired/Different from baseline Arousal/Alertness: Lethargic Orientation Level: Disoriented to place;Disoriented to time;Disoriented to situation;Oriented to person Attention: Focused Focused Attention: Appears intact Sustained Attention: Impaired Sustained Attention Impairment: Verbal basic;Functional basic Memory: Impaired Memory Impairment: Decreased short term memory;Decreased recall of new information;Storage deficit Decreased Short Term Memory: Verbal basic;Functional  basic Awareness: Impaired Awareness Impairment: Intellectual impairment Problem Solving: Impaired Problem Solving Impairment: Verbal basic;Functional basic Executive Function: Initiating;Self Monitoring;Decision Making Decision Making: Impaired Decision Making Impairment: Verbal basic;Functional basic Initiating: Impaired Initiating Impairment: Verbal basic;Functional basic Self Monitoring: Impaired Self Monitoring Impairment: Verbal basic;Functional basic Safety/Judgment: Impaired Comments:  (required Total A for utilization of call bell, R hand restraint to resist pulling out NG tube) Sensation Sensation Light Touch: Impaired Detail Light Touch Impaired Details: Absent LUE;Impaired LLE Stereognosis: Not tested Hot/Cold: Impaired Detail Hot/Cold Impaired Details: Impaired LUE;Impaired LLE Proprioception: Impaired Detail Proprioception Impaired Details: Absent LUE;Impaired LLE Coordination Gross Motor Movements are Fluid and Coordinated: No Fine Motor Movements are Fluid and Coordinated: No Coordination and Movement Description: significantly impaired on L side, L UE >LE Motor  Motor Motor: Hemiplegia;Abnormal postural alignment and control Motor - Skilled Clinical Observations: L hemiplegia with gaze preference to R side  Mobility  Transfers Transfers: Sit to Stand Sit to Stand: 3: Mod assist Sit to Stand Details: Tactile cues for initiation;Manual facilitation for weight shifting;Verbal cues for precautions/safety;Verbal cues for sequencing Stand to Sit: 3: Mod assist Stand to Sit Details (indicate cue type and reason): Tactile cues for initiation;Verbal cues for sequencing;Verbal cues for precautions/safety;Manual facilitation for weight shifting  Trunk/Postural Assessment  Cervical Assessment Cervical Assessment: Exceptions to [facility] (head turned to R with gaze preference) Thoracic Assessment Thoracic Assessment: Exceptions to [facility] (kyphosis) Lumbar Assessment Lumbar  Assessment: Exceptions to [facility] (posterior pelvic tilt) Postural Control Postural Control: Deficits on evaluation Head Control: significantly impaired Trunk Control: decreased  Balance Dynamic Sitting Balance Dynamic Sitting - Balance Support: Bilateral upper extremity supported;Feet supported Dynamic Sitting - Level of Assistance: 4: Min assist;3: Mod assist Dynamic Sitting - Balance Activities: Reaching for objects;Forward lean/weight shifting Dynamic Standing Balance Dynamic Standing - Balance Support: Right upper extremity supported;During functional activity Dynamic Standing - Level of Assistance: 2: Max assist;3: Mod assist Dynamic Standing - Balance Activities: Lateral lean/weight shifting;Forward lean/weight shifting;Reaching for objects Extremity/Trunk  Assessment RUE Assessment RUE Assessment: Within Functional Limits LUE Assessment LUE Assessment: Within Functional Limits LUE Strength LUE Overall Strength Comments: 0/5 gross strength throughout - PROM WFLs LUE Tone LUE Tone: Flaccid  FIM:  FIM - Eating Eating Activity: 1: Helper feeds patient (with trials) FIM - Grooming Grooming Steps: Wash, rinse, dry face;Wash, rinse, dry hands Grooming: 3: Patient completes 2 of 4 or 3 of 5 steps FIM - Bathing Bathing Steps Patient Completed: Chest;Left Arm;Abdomen Bathing: 2: Max-Patient completes 3-4 [id number] parts or 25-49% FIM - Upper Body Dressing/Undressing Upper body dressing/undressing steps patient completed: Thread/unthread right sleeve of front closure shirt/dress Upper body dressing/undressing: 2: Max-Patient completed 25-49% of tasks FIM - Lower Body Dressing/Undressing Lower body dressing/undressing: 1: Total-Patient completed less than 25% of tasks FIM - Toileting Toileting: 1: Total-Patient completed zero steps, helper did all 3 FIM - Toilet Transfers Toilet Transfers: 2-To toilet/BSC: Max A (lift and lower assist);2-From toilet/BSC: Max A (lift and lower assist)    Refer to Care Plan for Long Term Goals  Recommendations for other services: None  Discharge Criteria: Patient will be discharged from OT if patient refuses treatment 3 consecutive times without medical reason, if treatment goals not met, if there is a change in medical status, if patient makes no progress towards goals or if patient is discharged from hospital.  The above assessment, treatment plan, treatment alternatives and goals were discussed and mutually agreed upon: by patient and by family  PPhineas Semen10/28/2015, 4:46 PM

## 2014-02-02 NOTE — Evaluation (Signed)
Speech Language Pathology Assessment and Plan  Patient Details  Name: Sarah Daniel MRN: [id number] Date of Birth: 02-24-1922  SLP Diagnosis: Dysarthria;Cognitive Impairments;Dysphagia  Rehab Potential: Good ELOS: 22-30 days    Today's Date: 02/02/2014 SLP Individual Time: 1300-1400 SLP Individual Time Calculation (min): 60 min   Problem List:  Patient Active Problem List   Diagnosis Date Noted  . Stroke 01/29/2014  . CVA (cerebral infarction) 01/29/2014  . Hypothyroidism 01/29/2014  . Hypotension 03/08/2013  . Nausea & vomiting 03/08/2013  . Weakness generalized 03/08/2013  . Anemia 03/08/2013  . CKD (chronic kidney disease), stage III 03/08/2013  . Dementia   . Hypertension   . Thyroid disease   . Coronary artery disease    Past Medical History:  Past Medical History  Diagnosis Date  . Dementia   . Hypertension   . GERD (gastroesophageal reflux disease)   . Thyroid disease   . PUD (peptic ulcer disease)   . Coronary artery disease   . Hypothyroidism   . Arthritis    Past Surgical History:  Past Surgical History  Procedure Laterality Date  . Abdominal hysterectomy      Assessment / Plan / Recommendation Clinical Impression Sarah Daniel is a 80 y.o. right handed female with history of dementia maintained on Aricept, coronary artery disease, hypertension. By report patient lives with her daughter independent prior to admission and assistance as needed. Presented 01/29/2014 after being found down by her daughter with left-sided weakness and facial droop. Patient was a poor medical historian. MRI/MRA of the brain shows acute nonhemorrhagic anterior right MCA territory infarct as well as occlusion of the right internal carotid artery with partial reconstitution of the cavernous and ophthalmic segments. CT angiogram of the neck again showing right internal carotid artery occlusion as well as proximal left subclavian artery occlusion less than 10 mm from the aortic arch.  Patient did not receive TPA. Echocardiogram with ejection fraction of 40% grade 1 diastolic dysfunction. EEG with mild diffuse slowing of the waking background without seizure activity. Neurology service consulted maintained on aspirin for CVA prophylaxis as well as subcutaneous Lovenox for DVT prophylaxis. Swallow evaluation per speech therapy 01/30/2014 patient currently nothing by mouth with nasogastric tube in place for nutritional support. Physical occupational therapy evaluations completed with recommendations of physical medicine rehabilitation consult. Patient was admitted for comprehensive rehabilitation program on 02/01/14. Orders received; bedside swallow and cognitive-linguistic evaluations completed. Patient demonstrates overall left-sided weakness and reduced labial and lingual strength. Additionally, patient demonstrates poor awareness of bolus, a delayed swallow initiation (~10 seconds) and a wet vocal quality with all PO trials (ice chips, honey-thick liquids and puree textures). As a result of these factors and suspicion of silent aspiration, recommend to continue NPO at this time. Patient also demonstrates severe cognitive deficits demonstrated by decreased sustained attention (~5 seconds), intellectual and safety awareness, working memory and problem solving. Other impairments noted were poor initiation, delayed processing, severe left inattention and reduced speech intelligibility. Therefore, patient requires skilled SLP services to address above mentioned deficits and maximize functional independence to reduce overall burden of care prior to discharge home with 24/7 family assist.  Skilled Therapeutic Interventions          Patient administered a cognitive-linguistic and bedside swallow evaluation. Please see above for details. Patient and daughter educated in regards to current cognitive and swallowing function and goals of skilled SLP intervention. Patient and daughter verbalized  understanding.    SLP Assessment  Patient will need skilled [facility]  Services during CIR admission    Recommendations  Diet Recommendations: NPO Medication Administration: Via alternative means Supervision: Full supervision/cueing for compensatory strategies Oral Care Recommendations: Oral care BID Patient destination: Home Follow up Recommendations: Outpatient SLP;24 hour supervision/assistance Equipment Recommended: To be determined    SLP Frequency 5 out of 7 days   SLP Treatment/Interventions Cognitive remediation/compensation;Cueing hierarchy;Dysphagia/aspiration precaution training;Patient/family education;Oral motor exercises;Functional tasks;Internal/external aids;Therapeutic Activities    Pain Pain Assessment Pain Assessment: No/denies pain Prior Functioning Cognitive/Linguistic Baseline: Within functional limits Type of Home: House  Lives With: Daughter Available Help at Discharge: Family;Available PRN/intermittently  Short Term Goals: Week 1: SLP Short Term Goal 1 (Week 1): Pt will demonstrate an increased swallow initiation with Max A multimodal cues to utilize swallowing compensatory strategies. SLP Short Term Goal 2 (Week 1): Pt will demonstrated increased sustained attention with Max A multimodal cues. SLP Short Term Goal 3 (Week 1): Pt will utilize external memory aids to increase orientation with Max A multimodal cues. SLP Short Term Goal 4 (Week 1): Pt will demonstrate functional problem solving for basic and familiar tasks with Max A multimodal cues.  SLP Short Term Goal 5 (Week 1): Pt will use call bell to request assistance with Max A multimodal cues. SLP Short Term Goal 6 (Week 1): Pt will utilize over articulation and increased vocal intensity to increase speech intelligibility with Max A multimodal cues.   See FIM for current functional status Refer to Care Plan for Long Term Goals  Recommendations for other services: None  Discharge  Criteria: Patient will be discharged from SLP if patient refuses treatment 3 consecutive times without medical reason, if treatment goals not met, if there is a change in medical status, if patient makes no progress towards goals or if patient is discharged from hospital.  The above assessment, treatment plan, treatment alternatives and goals were discussed and mutually agreed upon: by patient and by family  Chenell Lozon 02/02/2014, 4:42 PM

## 2014-02-02 NOTE — Progress Notes (Signed)
[name] tube removed at 2207 due to its location in the right lung. Tube replacement, 0150, was unsuccessful with x2 attempts. Jacalyn LefevreEunice Thomas, NP made aware at 0155. Per telephone order, order placed to radiology for insertion. Kalya Troeger A. Quavon Keisling, RN.

## 2014-02-02 NOTE — Progress Notes (Signed)
Ranelle OysterZachary T Swartz, MD Physician Signed Physical Medicine and Rehabilitation Consult Note Service date: 01/31/2014 8:31 AM  Related encounter: Admission (Discharged) [facility] 01/29/2014 [facility]           Physical Medicine and Rehabilitation Consult Reason for Consult: CVA Referring Physician: [facility]     HPI: Sarah [place] is a 80 y.o. right handed female with history of dementia maintained on Aricept, coronary artery disease, hypertension. By report patient lives with her daughter independent prior to admission and assistance as needed. Presented 01/29/2014 after being found down by her daughter with left-sided weakness and facial droop. Patient was a poor medical historian. MRI/MRA of the brain shows acute nonhemorrhagic anterior right MCA territory infarct as well as occlusion of the right internal carotid artery with partial reconstitution of the cavernous and ophthalmic segments. CT angiogram of the neck again showing right internal carotid artery occlusion as well as proximal left subclavian artery occlusion less than 10 mm from the aortic arch. Patient did not receive TPA. Echocardiogram with ejection fraction of 60% grade 1 diastolic dysfunction. EEG is pending. Neurology service is consulted maintained on aspirin for CVA prophylaxis as well as subcutaneous Lovenox for DVT prophylaxis. Swallow evaluation per speech therapy 01/30/2014 patient currently nothing by mouth with question plan nasogastric tube versus PEG tube. Physical occupational therapy evaluations completed with recommendations of physical medicine rehabilitation consult.     Review of Systems  Unable to perform ROS: language  Past Medical History   Diagnosis  Date   .  Dementia     .  Hypertension     .  GERD (gastroesophageal reflux disease)     .  Thyroid disease     .  PUD (peptic ulcer disease)     .  Coronary artery disease     .  Hypothyroidism     .  Arthritis       Past Surgical History   Procedure  Laterality  Date   .  Abdominal hysterectomy        History reviewed. No pertinent family history. Social History: reports that she has quit smoking. She has never used smokeless tobacco. She reports that she does not drink alcohol or use illicit drugs. Allergies: No Known Allergies Medications Prior to Admission   Medication  Sig  Dispense  Refill   .  atorvastatin (LIPITOR) 40 MG tablet  Take 40 mg by mouth daily.         .  cholecalciferol (VITAMIN D) 1000 UNITS tablet  Take 1,000 Units by mouth daily.         Marland Kitchen.  donepezil (ARICEPT) 10 MG tablet  Take 10 mg by mouth daily.          .  IRON PO  Take 1 tablet by mouth daily.         Marland Kitchen.  levothyroxine (SYNTHROID, LEVOTHROID) 50 MCG tablet  Take 50 mcg by mouth daily before breakfast.         .  venlafaxine XR (EFFEXOR-XR) 75 MG 24 hr capsule  Take 75 mg by mouth daily with breakfast.            Home: Home Living Family/patient expects to be discharged to:: Private residence Living Arrangements: Children (daughter) Available Help at Discharge: Family;Available PRN/intermittently ([facility] 3 days/week 10-2 (rides [employer] bus)) Type of Home: House Home Access: Level entry Home Layout: One level;Able to live on main level with bedroom/bathroom Home Equipment: Cane - single  point Additional Comments: walk-in shower   Functional History: Prior Function Level of Independence: Independent Comments: pt. overall very independent, at home up to 4 hours without supervision Functional Status:   Mobility: Bed Mobility Overal bed mobility: Needs Assistance Bed Mobility: Supine to Sit;Sit to Supine;Rolling Rolling: Mod assist Sidelying to sit: Mod assist Supine to sit: Mod assist Sit to supine: Mod assist Sit to sidelying: Mod assist General bed mobility comments: assist with LE's when returning to bed. Rolled to assist with hygiene. Transfers Overall transfer level: Needs assistance Equipment used: 1  person hand held assist Transfers: Sit to/from [employer] Sit to Stand: Mod assist Stand pivot transfers: Mod assist General transfer comment: cues for technique.  Ambulation/Gait Ambulation/Gait assistance: Max assist Ambulation Distance (Feet): 6 Feet Assistive device: 1 person hand held assist Gait Pattern/deviations: Step-to pattern;Decreased stride length;Decreased dorsiflexion - left;Decreased weight shift to right;Shuffle Gait velocity interpretation: <1.8 ft/sec, indicative of risk for recurrent falls General Gait Details: bearing weight on LLE without buckling, however required assist to advance and place LLE; assist at pelvis and ribs for weightshifting/balance; as approached chair, IV team arrived to place IV for pt to go down for CT angiogram and returned pt to bed   ADL: ADL Overall ADL's : Needs assistance/impaired Grooming: Brushing hair;Moderate assistance (applying lotion) Upper Body Bathing: Sitting;Moderate assistance Lower Body Bathing: Maximal assistance;Sit to/from stand Upper Body Dressing : Sitting;Maximal assistance Lower Body Dressing: Maximal assistance;Sit to/from stand Toilet Transfer: Stand-pivot;BSC;Maximal assistance Toileting- Clothing Manipulation and Hygiene: Bed level;Maximal assistance;Sitting/lateral lean Functional mobility during ADLs: Maximal assistance (stand pivot) General ADL Comments: Recommended family sit on left side to try to get pt to look to left. Pt incontinent of stool when OT arrived. Assisted in cleaning pt. Discussed with family member safety/positioning of LUE and to have it elevated on pillow. Discussed CIR with family member. Pt with decreased balance sitting EOB-leaning back at times.   Cognition: Cognition Overall Cognitive Status: Difficult to assess Orientation Level: Oriented to person;Oriented to place Cognition Arousal/Alertness: Awake/alert Behavior During Therapy: Impulsive;Restless Overall  Cognitive Status: Difficult to assess Area of Impairment: Attention;Safety/judgement;Following commands Current Attention Level: Sustained Following Commands: Follows one step commands inconsistently Safety/Judgement: Decreased awareness of safety;Decreased awareness of deficits Difficult to assess due to: Impaired communication   Blood pressure 126/75, pulse 79, temperature 98.5 F (36.9 C), temperature source Oral, resp. rate 18, height 4\' 9"  (1.448 m), weight 42.185 kg (93 lb), SpO2 99.00%. Physical Exam  HENT:  Left facial droop  Eyes:  Pupils reactive to light  Neck: Normal range of motion. Neck supple. No thyromegaly present.  Cardiovascular: Normal rate and regular rhythm.   Respiratory: Effort normal and breath sounds normal. No respiratory distress.  GI: Soft. Bowel sounds are normal. She exhibits no distension.  Neurological: She is alert.  Patient displays a severe left inattention. (neck/SCM is actually tight due to severe rotation to right)She would follow some inconsistent commands. She did attempt mouth most words but mostly unintelligible. Makes little eye contact. 0/5 both UE and LE. Does not react to pain on left.   Skin: Skin is warm and dry.  Psychiatric:  Flat, did not engage    No results found for this or any previous visit (from the past 24 hour(s)). Dg Chest 1 View   01/29/2014   CLINICAL DATA:  Left-sided weakness, slurred speech. History of hypertension. Found on floor.  EXAM: CHEST - 1 VIEW  COMPARISON:  03/08/2013  FINDINGS: Heart is normal size. No confluent  airspace opacities or effusions. Calcifications in the aortic arch. No visible aneurysm. No acute bony abnormality.  IMPRESSION: No active disease.   Electronically Signed   By: Charlett Nose M.D.   On: 01/29/2014 17:03    Ct Head Wo Contrast   01/29/2014   CLINICAL DATA:  Stroke symptoms, left-sided weakness, slurred speech.  EXAM: CT HEAD WITHOUT CONTRAST  TECHNIQUE: Contiguous axial images were  obtained from the base of the skull through the vertex without intravenous contrast.  COMPARISON:  None.  FINDINGS: Cortical hypodensity in the right frontoparietal region (series 201/ image 16), suspicious for acute/subacute right MCA distribution infarct.  No evidence of parenchymal hemorrhage or extra-axial fluid collection.  No mass lesion, mass effect, or midline shift.  Subcortical white matter and periventricular small vessel ischemic changes. Intracranial atherosclerosis.  Global cortical atrophy.  No ventriculomegaly.  The visualized paranasal sinuses are essentially clear. The mastoid air cells are unopacified.  No evidence of calvarial fracture.  IMPRESSION: Suspected acute/subacute right MCA distribution infarct.  These results were called by telephone at the time of interpretation on 01/29/2014 at 4:49 pm to Dr. Lynelle Doctor, who verbally acknowledged these results.   Electronically Signed   By: Charline Bills M.D.   On: 01/29/2014 16:49    Ct Angio Neck W/cm &/or Wo/cm   01/30/2014   CLINICAL DATA:  Acute right MCA territory infarct. Right internal carotid artery occlusion.  EXAM: CT ANGIOGRAPHY NECK  TECHNIQUE: Multidetector CT imaging of the neck was performed using the standard protocol during bolus administration of intravenous contrast. Multiplanar CT image reconstructions and MIPs were obtained to evaluate the vascular anatomy. Carotid stenosis measurements (when applicable) are obtained utilizing NASCET criteria, using the distal internal carotid diameter as the denominator.  CONTRAST:  50 mL Omnipaque 350  COMPARISON:  MRI brain and MRA head 01/29/2014  FINDINGS: There is a common origin of the left common carotid artery and the innominate artery. Dense atherosclerotic calcifications are present in the distal arch. The subclavian artery is occluded less than 1 cm from the origin. It is reconstituted at the level of the left vertebral artery.  The vertebral arteries originate from the  subclavian arteries bilaterally. Calcifications are present at the origin of the vertebral arteries without significant stenosis. There is marked tortuosity in the proximal right vertebral artery with a 50% stenosis. Contrast density is less and the proximal left vertebral artery been in the right compatible with a subclavian steal phenomenon. There are no significant vertebral artery stenoses in the neck. Atherosclerotic irregularity is present within the distal vertebral arteries bilaterally, left greater than right without significant stenosis. The PICA origins are visualized and normal.  The right common carotid artery is tortuous proximally without significant stenosis. The right internal carotid artery is occluded at its origin. There is no reconstitution in the neck. The artery is reconstituted within the cavernous segments, likely from the ophthalmic artery. Dense calcifications are present within the cavernous right internal carotid artery.  The left common carotid artery is within normal limits. The bifurcation demonstrates atherosclerotic change without a significant stenosis relative to the distal vessel. The cervical and proximal intracranial left ICA is unremarkable.  Limited imaging of the brain again demonstrates the right MCA territory infarcts.  There is fusion of posterior elements at C2-3. Anterolisthesis at C4-5 and C5-6 appears degenerative with advanced facet disease at both levels. There is chronic loss of height and endplate changes at C6-7 and to lesser extent at C7-T1. No acute osseous abnormality is  present.  Review of the MIP images confirms the above findings.  IMPRESSION: 1. The right internal carotid artery is occluded at the bifurcation. It is reconstituted within the cavernous segment, likely from the right ophthalmic artery. 2. The proximal left subclavian artery is occluded less than 10 mm from the aortic arch. It is reconstituted at the level of the left vertebral artery, likely  from retrograde flow is discussed. 3. Atherosclerotic changes within the left carotid artery bifurcation without significant stenosis. 4. Atherosclerotic changes within the vertebral arteries above the dural margin bilaterally without significant stenoses. 5. Advanced spondylosis of the cervical spine.   Electronically Signed   By: Gennette Pac M.D.   On: 01/30/2014 13:09    Mr Maxine Glenn Head Wo Contrast   01/29/2014   CLINICAL DATA:  Left-sided weakness, slurred speech, and rightward gaze. Headaches. Stroke.  EXAM: MRI HEAD WITHOUT CONTRAST  MRA HEAD WITHOUT CONTRAST  TECHNIQUE: Multiplanar, multiecho pulse sequences of the brain and surrounding structures were obtained without intravenous contrast. Angiographic images of the head were obtained using MRA technique without contrast.  COMPARISON:  CT head from the same day.  FINDINGS: MRI HEAD FINDINGS  The diffusion-weighted images confirm an acute nonhemorrhagic anterior right MCA territory infarct involving the right frontal operculum and insular cortex. There is a focus of restricted diffusion within the lateral right temporal lobe as well a more remote posterior right temporal and parietal infarct is again noted. No acute hemorrhage or mass lesion is present.  Advanced atrophy and diffuse white matter changes are otherwise stable. There dilated perivascular spaces and remote lacunar infarcts within the basal ganglia bilaterally.  Flow is present in the left internal carotid artery in the vertebrobasilar system. There is no flow within the right carotid artery. Abnormal signal suggests attenuated or occluded flow within the right middle cerebral artery as well.  Patient is status post bilateral lens extractions. The globes and orbits are otherwise intact. Paranasal sinuses are clear. There is some fluid in left mastoid air cells. No obstructing at nasopharyngeal lesion is evident.  MRA HEAD FINDINGS  The right internal carotid artery is occluded. There is  partial reconstitution at the level of the ophthalmic artery with some flow seen into the anterior genu of the cavernous carotid artery as well. There is markedly attenuated flow within the right M1 and A1 segments. No MCA territory branch vessels are seen. There is markedly attenuated signal within the right A2 segments. No definite anterior communicating artery is evident. The left A1 and M1 segments are normal. The left MCA bifurcation is within normal limits. There is some attenuation of distal left MCA branch vessels. The left ACA branch vessels are normal.  The right vertebral artery is the dominant vessel. The PICA origins are visualized and normal bilaterally. The basilar artery is within normal limits. Both posterior cerebral arteries originate from the basilar tip. Asymmetric attenuation of PCA branch vessels is worse on the left.  IMPRESSION: 1. Acute nonhemorrhagic anterior right MCA territory infarct. 2. More remote posterior right MCA territory infarcts. 3. Occlusion of the right internal carotid artery with partial reconstitution in the cavernous and ophthalmic segments. 4. Diminished flow intensity in the right M1 and A1 segments with no flow signal seen beyond the right MCA bifurcation. 5. Mild distal small vessel disease in the left MCA branch vessels and left greater than right PCA branch vessels. These results were called by telephone at the time of interpretation on 01/29/2014 at 7:02 pm to Dr. Georga Hacking  CAMILO , who verbally acknowledged these results.   Electronically Signed   By: Gennette Pachris  Mattern M.D.   On: 01/29/2014 19:02    Mr Brain Wo Contrast   01/29/2014   CLINICAL DATA:  Left-sided weakness, slurred speech, and rightward gaze. Headaches. Stroke.  EXAM: MRI HEAD WITHOUT CONTRAST  MRA HEAD WITHOUT CONTRAST  TECHNIQUE: Multiplanar, multiecho pulse sequences of the brain and surrounding structures were obtained without intravenous contrast. Angiographic images of the head were obtained  using MRA technique without contrast.  COMPARISON:  CT head from the same day.  FINDINGS: MRI HEAD FINDINGS  The diffusion-weighted images confirm an acute nonhemorrhagic anterior right MCA territory infarct involving the right frontal operculum and insular cortex. There is a focus of restricted diffusion within the lateral right temporal lobe as well a more remote posterior right temporal and parietal infarct is again noted. No acute hemorrhage or mass lesion is present.  Advanced atrophy and diffuse white matter changes are otherwise stable. There dilated perivascular spaces and remote lacunar infarcts within the basal ganglia bilaterally.  Flow is present in the left internal carotid artery in the vertebrobasilar system. There is no flow within the right carotid artery. Abnormal signal suggests attenuated or occluded flow within the right middle cerebral artery as well.  Patient is status post bilateral lens extractions. The globes and orbits are otherwise intact. Paranasal sinuses are clear. There is some fluid in left mastoid air cells. No obstructing at nasopharyngeal lesion is evident.  MRA HEAD FINDINGS  The right internal carotid artery is occluded. There is partial reconstitution at the level of the ophthalmic artery with some flow seen into the anterior genu of the cavernous carotid artery as well. There is markedly attenuated flow within the right M1 and A1 segments. No MCA territory branch vessels are seen. There is markedly attenuated signal within the right A2 segments. No definite anterior communicating artery is evident. The left A1 and M1 segments are normal. The left MCA bifurcation is within normal limits. There is some attenuation of distal left MCA branch vessels. The left ACA branch vessels are normal.  The right vertebral artery is the dominant vessel. The PICA origins are visualized and normal bilaterally. The basilar artery is within normal limits. Both posterior cerebral arteries originate  from the basilar tip. Asymmetric attenuation of PCA branch vessels is worse on the left.  IMPRESSION: 1. Acute nonhemorrhagic anterior right MCA territory infarct. 2. More remote posterior right MCA territory infarcts. 3. Occlusion of the right internal carotid artery with partial reconstitution in the cavernous and ophthalmic segments. 4. Diminished flow intensity in the right M1 and A1 segments with no flow signal seen beyond the right MCA bifurcation. 5. Mild distal small vessel disease in the left MCA branch vessels and left greater than right PCA branch vessels. These results were called by telephone at the time of interpretation on 01/29/2014 at 7:02 pm to Dr. Wyatt PortelaSVALDO CAMILO , who verbally acknowledged these results.   Electronically Signed   By: Gennette Pachris  Mattern M.D.   On: 01/29/2014 19:02     Assessment/Plan: Diagnosis: Right MCA infarct with dense left hemiparesis and visual spatial deficits Does the need for close, 24 hr/day medical supervision in concert with the patient's rehab needs make it unreasonable for this patient to be served in a less intensive setting? Yes Co-Morbidities requiring supervision/potential complications: cad, anemia, hx of dementia Due to bladder management, bowel management, safety, skin/wound care, disease management, medication administration and patient education, does the  patient require 24 hr/day rehab nursing? Yes Does the patient require coordinated care of a physician, rehab nurse, PT (1-2 hrs/day, 5 days/week), OT (1-2 hrs/day, 5 days/week) and SLP (1-2 hrs/day, 5 days/week) to address physical and functional deficits in the context of the above medical diagnosis(es)? Yes Addressing deficits in the following areas: balance, endurance, locomotion, strength, transferring, bowel/bladder control, bathing, dressing, feeding, grooming, toileting, cognition, speech, language, swallowing and psychosocial support Can the patient actively participate in an intensive therapy  program of at least 3 hrs of therapy per day at least 5 days per week? Yes and Potentially The potential for patient to make measurable gains while on inpatient rehab is good Anticipated functional outcomes upon discharge from inpatient rehab are min assist and mod assist  with PT, min assist and mod assist with OT, min assist and mod assist with SLP. Estimated rehab length of stay to reach the above functional goals is: 25-30 days Does the patient have adequate social supports to accommodate these discharge functional goals? Yes and Potentially Anticipated D/C setting: Home Anticipated post D/C treatments: HH therapy Overall Rehab/Functional Prognosis: good   RECOMMENDATIONS: This patient's condition is appropriate for continued rehabilitative care in the following setting: CIR if daughter is willing to provide for projected care needs. According to the daughter, the patient was functional at a household level prior to stroke. Patient has agreed to participate in recommended program. N/A Note that insurance prior authorization may be required for reimbursement for recommended care.   Comment: Rehab Admissions Coordinator to follow up.   Thanks,   Ranelle Oyster, MD, [place]         01/31/2014    Revision History...     Date/Time User Action   01/31/2014 10:27 AM Ranelle Oyster, MD Sign   01/31/2014 8:44 AM Charlton Amor, PA-C Pend  View Details Report   Routing History...     Date/Time From To Method   01/31/2014 10:27 AM Ranelle Oyster, MD Ranelle Oyster, MD In Basket   01/31/2014 10:27 AM Ranelle Oyster, MD Lillia Mountain, MD Fax

## 2014-02-02 NOTE — Progress Notes (Signed)
Patient information reviewed and entered into eRehab system by Tora DuckMarie Erilyn Pearman, RN, CRRN, PPS Coordinator.  Information including medical coding and functional independence measure will be reviewed and updated through discharge.     Per nursing patient's daughter was given "Data Collection Information Summary for Patients in Inpatient Rehabilitation Facilities with attached "Privacy Act Statement-Health Care Records" due to patient unable to fully understand document.

## 2014-02-02 NOTE — Evaluation (Signed)
Physical Therapy Assessment and Plan  Patient Details  Name: Sarah Daniel MRN: [id number] Date of Birth: [birth year]  PT Diagnosis: Abnormal posture, Abnormality of gait, Cognitive deficits, Coordination disorder, Hemiplegia non-dominant, Impaired sensation, Muscle weakness and Pain in neck, Decreased activity tolerance, Decreased Balance Rehab Potential: Good ELOS: 25-28days   Today's Date: 02/02/2014 PT Individual Time: 0800-0900 PT Individual Time Calculation (min): 60 min    Problem List:  Patient Active Problem List   Diagnosis Date Noted  . Stroke 01/29/2014  . CVA (cerebral infarction) 01/29/2014  . Hypothyroidism 01/29/2014  . Hypotension 03/08/2013  . Nausea & vomiting 03/08/2013  . Weakness generalized 03/08/2013  . Anemia 03/08/2013  . CKD (chronic kidney disease), stage III 03/08/2013  . Dementia   . Hypertension   . Thyroid disease   . Coronary artery disease     Past Medical History:  Past Medical History  Diagnosis Date  . Dementia   . Hypertension   . GERD (gastroesophageal reflux disease)   . Thyroid disease   . PUD (peptic ulcer disease)   . Coronary artery disease   . Hypothyroidism   . Arthritis    Past Surgical History:  Past Surgical History  Procedure Laterality Date  . Abdominal hysterectomy      Assessment & Plan Clinical Impression: Sarah Daniel is a 80 y.o. right handed female with history of dementia maintained on Aricept, coronary artery disease, hypertension. By report patient lives with her daughter independent prior to admission and assistance as needed. Presented 01/29/2014 after being found down by her daughter with left-sided weakness and facial droop. Patient was a poor medical historian. MRI/MRA of the brain shows acute nonhemorrhagic anterior right MCA territory infarct as well as occlusion of the right internal carotid artery with partial reconstitution of the cavernous and ophthalmic segments. CT angiogram of the neck again  showing right internal carotid artery occlusion as well as proximal left subclavian artery occlusion less than 10 mm from the aortic arch. Patient did not receive TPA. Echocardiogram with ejection fraction of 24% grade 1 diastolic dysfunction. EEG with mild diffuse slowing of the waking background without seizure activity. Neurology service consulted maintained on aspirin for CVA prophylaxis as well as subcutaneous Lovenox for DVT prophylaxis. Swallow evaluation per speech therapy 01/30/2014 patient currently nothing by mouth with nasogastric tube in place for nutritional support Physical occupational therapy evaluations completed with recommendations of physical medicine rehabilitation consult. Patient was admitted for comprehensive rehabilitation program. Patient transferred to CIR on 02/01/2014 .   Patient currently requires max with mobility secondary to muscle weakness, decreased cardiorespiratoy endurance, impaired timing and sequencing, abnormal tone, unbalanced muscle activation and decreased coordination, decreased visual acuity and decreased visual perceptual skills, decreased midline orientation, decreased attention to left and left side neglect, decreased initiation, decreased attention, decreased awareness, decreased problem solving, decreased safety awareness, decreased memory and delayed processing and decreased sitting balance, decreased standing balance, decreased postural control, hemiplegia and decreased balance strategies.  Prior to hospitalization, patient was independent  with mobility and lived with Daughter in a House home.  Home access is  Level entry.  Patient will benefit from skilled PT intervention to maximize safe functional mobility, minimize fall risk and decrease caregiver burden for planned discharge home with 24 hour assist.  Anticipate patient will benefit from follow up [facility] at discharge.  PT - End of Session Activity Tolerance: Tolerates 10 - 20 min activity with multiple  rests Endurance Deficit: Yes PT Assessment Rehab Potential: Good Barriers to Discharge: Decreased  caregiver support PT Patient demonstrates impairments in the following area(s): Balance;Endurance;Motor;Pain;Nutrition;Perception;Safety;Sensory;Skin Integrity;Other (comment);Edema (strength) PT Transfers Functional Problem(s): Bed Mobility;Bed to Chair;Car;Furniture PT Locomotion Functional Problem(s): Ambulation;Wheelchair Mobility;Stairs PT Plan PT Intensity: Minimum of 1-2 x/day ,45 to 90 minutes PT Frequency: 5 out of 7 days PT Duration Estimated Length of Stay: 25-28days PT Treatment/Interventions: Ambulation/gait training;Community reintegration;DME/adaptive equipment instruction;Psychosocial support;Neuromuscular re-education;Stair training;UE/LE Strength taining/ROM;Wheelchair propulsion/positioning;UE/LE Coordination activities;Therapeutic Activities;Skin care/wound management;Pain management;Functional electrical stimulation;Discharge planning;Balance/vestibular training;Cognitive remediation/compensation;Disease management/prevention;Functional mobility training;Patient/family education;Splinting/orthotics;Therapeutic Exercise;Visual/perceptual remediation/compensation PT Transfers Anticipated Outcome(s): Supervision-Min A  PT Locomotion Anticipated Outcome(s): Supervision-Min A PT Recommendation Follow Up Recommendations: Home health PT Patient destination: Home Equipment Recommended: To be determined  Skilled Therapeutic Intervention 1:1. Pt received semi-reclined in bed, RN present to provide medication. PT evaluation performed, see detailed objective information below. Pt and daughter educated on rehab environment, role of therapies, goals for physical therapy and general safety plan. Pt's daughter verbalized understanding, pt and daughter will require ongoing education regarding pt's impairments. Tx initiated with emphasis on w/c propulsion, safety during functional transfers,  ambulation, L inattention and PROM to neck musculature. Pt left sitting in w/c at end of session in care of OT, daughter in room.   PT Evaluation Precautions/Restrictions Precautions Precautions: Fall Precaution Comments: Severe L inattention Restrictions Weight Bearing Restrictions: No General Chart Reviewed: Yes Family/Caregiver Present: Yes (Daughter, Dee) Vital SignsTherapy Vitals Temp: 98.1 F (36.7 C) Temp Source: Axillary Pulse Rate: 76 Resp: 18 BP: 134/72 mmHg Patient Position (if appropriate): Lying Oxygen Therapy SpO2: 95 % O2 Device: Not Delivered Pain Pain Assessment Pain Assessment: No/denies pain Home Living/Prior Functioning Home Living Available Help at Discharge: Family;Available PRN/intermittently Type of Home: House Home Access: Level entry Home Layout: One level;Able to live on main level with bedroom/bathroom Additional Comments: walk-in shower  Lives With: Daughter Prior Function Level of Independence: Independent with basic ADLs;Independent with transfers;Independent with gait;Independent with homemaking with ambulation  Able to Take Stairs?: Yes Driving: No Leisure: Hobbies-yes (Comment) Comments: Pt overall very independent, at home up to 4 hours without supervision; Enjoys reading the news, gardening and politics Vision/Perception  Vision - Assessment Additional Comments: Pt with apparent L inattention and R visual gaze preference  See OT eval for additional details Cognition Overall Cognitive Status: Impaired/Different from baseline Arousal/Alertness: Lethargic Orientation Level: Disoriented to place;Disoriented to time;Disoriented to situation;Oriented to person Attention: Focused Focused Attention: Appears intact Sustained Attention: Impaired Sustained Attention Impairment: Verbal basic;Functional basic Memory: Impaired Memory Impairment: Decreased short term memory;Decreased recall of new information;Storage deficit Decreased Short  Term Memory: Verbal basic;Functional basic Awareness: Impaired Awareness Impairment: Intellectual impairment Problem Solving: Impaired Problem Solving Impairment: Verbal basic;Functional basic Executive Function: Initiating;Self Monitoring;Decision Making Decision Making: Impaired Decision Making Impairment: Verbal basic;Functional basic Initiating: Impaired Initiating Impairment: Verbal basic;Functional basic Self Monitoring: Impaired Self Monitoring Impairment: Verbal basic;Functional basic Safety/Judgment: Impaired Comments:  (required Total A for utilization of call bell, R hand restraint to resist pulling out NG tube) Sensation Sensation Light Touch: Impaired Detail Light Touch Impaired Details: Absent LUE;Impaired LLE Proprioception: Impaired Detail Proprioception Impaired Details: Absent LUE;Impaired LLE Coordination Gross Motor Movements are Fluid and Coordinated: No Fine Motor Movements are Fluid and Coordinated: No Coordination and Movement Description: Significantly impaired on L side, L UE>LE Motor  Motor Motor: Hemiplegia;Abnormal postural alignment and control Motor - Skilled Clinical Observations: L hemiplegia; head rotated to R with R visual gaze preference  Mobility Bed Mobility Bed Mobility: Rolling Left;Rolling Right;Supine to Sit Rolling Right: 2: Max assist Rolling Right Details: Tactile cues for  initiation;Manual facilitation for placement;Manual facilitation for weight shifting;Tactile cues for placement;Verbal cues for sequencing Rolling Left: 2: Max assist Rolling Left Details: Tactile cues for initiation;Tactile cues for placement;Verbal cues for sequencing;Manual facilitation for placement;Manual facilitation for weight shifting Supine to Sit: HOB elevated;2: Max assist Supine to Sit Details: Tactile cues for placement;Tactile cues for initiation;Manual facilitation for weight shifting;Verbal cues for sequencing;Manual facilitation for  placement Transfers Transfers: Yes Sit to Stand: 3: Mod assist Sit to Stand Details: Tactile cues for initiation;Manual facilitation for weight shifting;Verbal cues for precautions/safety;Verbal cues for sequencing Stand to Sit: 3: Mod assist Stand to Sit Details (indicate cue type and reason): Tactile cues for initiation;Verbal cues for sequencing;Verbal cues for precautions/safety;Manual facilitation for weight shifting Stand Pivot Transfers: 2: Max assist Stand Pivot Transfer Details: Manual facilitation for weight shifting;Verbal cues for precautions/safety;Verbal cues for sequencing;Manual facilitation for placement;Tactile cues for placement Squat Pivot Transfers: 3: Mod assist Squat Pivot Transfer Details: Verbal cues for sequencing;Tactile cues for placement;Manual facilitation for placement;Manual facilitation for weight shifting;Verbal cues for precautions/safety Locomotion  Ambulation Ambulation: Yes Ambulation/Gait Assistance: 2: Max assist;3: Mod assist Ambulation Distance (Feet): 30 Feet Assistive device: 1 person hand held assist Ambulation/Gait Assistance Details: Manual facilitation for weight shifting;Verbal cues for gait pattern;Verbal cues for precautions/safety;Tactile cues for posture Ambulation/Gait Assistance Details: Therapist manually facilitating weightshift to R side for improved L foot clearance and step length, progressive trunk flexion Gait Gait: Yes Gait Pattern: Impaired Gait Pattern: Step-through pattern;Decreased step length - left;Decreased stance time - right;Decreased hip/knee flexion - left;Decreased dorsiflexion - left;Decreased weight shift to right;Decreased weight shift to left;Trunk flexed;Narrow base of support;Decreased step length - right;Decreased stance time - left;Left flexed knee in stance Stairs / Additional Locomotion Stairs: No (Unsafe to attempt at time of eval) Wheelchair Mobility Wheelchair Mobility: Yes Wheelchair Assistance: 3: Mod  assist;2: Max Technical sales engineer Details: Verbal cues for technique;Manual facilitation for placement;Tactile cues for placement;Tactile cues for initiation;Tactile cues for sequencing;Verbal cues for sequencing Wheelchair Propulsion: Right lower extremity;Right upper extremity Wheelchair Parts Management: Needs assistance Distance: 75', difficulty incorporating use of R LE, but attempting to consistently propel with R UE  Trunk/Postural Assessment  Cervical Assessment Cervical Assessment: Exceptions to [facility] Cervical AROM Overall Cervical AROM Comments: AROM to R WFL; unable to actively rotate head L to midline; Severe R rotation Cervical PROM Overall Cervical PROM Comments: Able to passively move to midline from R side, but painful due to tight neck musculature Thoracic Assessment Thoracic Assessment: Exceptions to [facility] Thoracic AROM Overall Thoracic AROM Comments: Presents with kyphosis and slight R lateral rotation Lumbar Assessment Lumbar Assessment: Exceptions to [facility] Lumbar AROM Overall Lumbar AROM Comments: posterior pelvic tilt Postural Control Postural Control: Deficits on evaluation Head Control: significantly impaired Trunk Control: decreased Righting Reactions: significantly impaired Protective Responses: significantly impaired  Balance Balance Balance Assessed: Yes Static Sitting Balance Static Sitting - Balance Support: Bilateral upper extremity supported;Feet supported Static Sitting - Level of Assistance: 4: Min assist Static Sitting - Comment/# of Minutes: L lean Dynamic Sitting Balance Dynamic Sitting - Balance Support: Bilateral upper extremity supported;Feet supported Dynamic Sitting - Level of Assistance: 4: Min assist;3: Mod assist Dynamic Sitting - Balance Activities: Lateral lean/weight shifting;Forward lean/weight shifting;Reaching for objects Static Standing Balance Static Standing - Balance Support: Right upper extremity supported Static  Standing - Level of Assistance: 3: Mod assist Dynamic Standing Balance Dynamic Standing - Balance Support: Right upper extremity supported;During functional activity Dynamic Standing - Level of Assistance: 2: Max assist;3: Mod assist Dynamic Standing -  Balance Activities: Lateral lean/weight shifting;Forward lean/weight shifting;Reaching for objects Extremity Assessment  RUE Assessment RUE Assessment: Within Functional Limits LUE Assessment LUE Assessment: Exceptions to [facility] LUE Strength LUE Overall Strength Comments: 0/5 throughout LUE Tone LUE Tone: Flaccid RLE Assessment RLE Assessment: Within Functional Limits LLE Assessment LLE Assessment: Exceptions to [employer] [facility] LLE Strength LLE Overall Strength: Due to impaired cognition;Deficits LLE Overall Strength Comments: Unable to formally assess due to cognition and L inattention, demontrates poor-fair functional strength  FIM:  FIM - Bed/Chair Transfer Bed/Chair Transfer Assistive Devices: Bed rails;Arm rests Bed/Chair Transfer: 2: Supine > Sit: Max A (lifting assist/Pt. 25-49%);3: Bed > Chair or W/C: Mod A (lift or lower assist);3: Chair or W/C > Bed: Mod A (lift or lower assist) FIM - Locomotion: Wheelchair Distance: 75', difficulty incorporating use of R LE, but attempting to consistently propel with R UE Locomotion: Wheelchair: 2: Travels 50 - 149 ft with maximal assistance (Pt: 25 - 49%) FIM - Locomotion: Ambulation Locomotion: Ambulation Assistive Devices: Other (comment) (R HHA) Ambulation/Gait Assistance: 2: Max assist;3: Mod assist Locomotion: Ambulation: 1: Travels less than 50 ft with maximal assistance (Pt: 25 - 49%) FIM - Locomotion: Stairs Locomotion: Stairs: 0: Activity did not occur   Refer to Care Plan for Long Term Goals  Recommendations for other services: None  Discharge Criteria: Patient will be discharged from PT if patient refuses treatment 3 consecutive times without medical reason, if treatment goals not met,  if there is a change in medical status, if patient makes no progress towards goals or if patient is discharged from hospital.  The above assessment, treatment plan, treatment alternatives and goals were discussed and mutually agreed upon: by patient  Gilmore Laroche 02/02/2014, 5:44 PM

## 2014-02-03 ENCOUNTER — Inpatient Hospital Stay (HOSPITAL_COMMUNITY): Payer: Medicare Other

## 2014-02-03 ENCOUNTER — Inpatient Hospital Stay (HOSPITAL_COMMUNITY): Payer: Medicare Other | Admitting: Speech Pathology

## 2014-02-03 ENCOUNTER — Inpatient Hospital Stay (HOSPITAL_COMMUNITY): Payer: Medicare Other | Admitting: Occupational Therapy

## 2014-02-03 DIAGNOSIS — N183 Chronic kidney disease, stage 3 (moderate): Secondary | ICD-10-CM

## 2014-02-03 DIAGNOSIS — Z4659 Encounter for fitting and adjustment of other gastrointestinal appliance and device: Secondary | ICD-10-CM

## 2014-02-03 MED ORDER — MUSCLE RUB 10-15 % EX CREA
TOPICAL_CREAM | CUTANEOUS | Status: DC | PRN
  Administered 2014-02-03 – 2014-02-07 (×2): via TOPICAL
  Administered 2014-02-09: 1 via TOPICAL
  Administered 2014-02-14 – 2014-02-15 (×2): via TOPICAL
  Filled 2014-02-03: qty 85

## 2014-02-03 NOTE — Progress Notes (Addendum)
Speech Language Pathology Daily Session Note  Patient Details  Name: Sarah Daniel MRN: [id number] Date of Birth: 25-Oct-1921  Today's Date: 02/03/2014 SLP Individual Time: Session 1: 1008-1100: Session 2: 1500-1520 SLP Individual Time Calculation (min): Session 1: 52 min; Session 2: 20 min  Short Term Goals: Week 1: SLP Short Term Goal 1 (Week 1): Pt will demonstrate an increased swallow initiation with Max A multimodal cues to utilize swallowing compensatory strategies. SLP Short Term Goal 2 (Week 1): Pt will demonstrated increased sustained attention with Max A multimodal cues. SLP Short Term Goal 3 (Week 1): Pt will utilize external memory aids to increase orientation with Max A multimodal cues. SLP Short Term Goal 4 (Week 1): Pt will demonstrate functional problem solving for basic and familiar tasks with Max A multimodal cues.  SLP Short Term Goal 5 (Week 1): Pt will use call bell to request assistance with Max A multimodal cues. SLP Short Term Goal 6 (Week 1): Pt will utilize over articulation and increased vocal intensity to increase speech intelligibility with Max A multimodal cues.   Skilled Therapeutic Interventions:  Session 1: Pt was seen for skilled ST targeting cognitive  goals.  Upon arrival, pt was partially reclined in bed, awake, lethargic, but agreeable to participate in ST.  Pt required total assist to make eye contact with the SLP when positioned at midline or to the pt's left.  SLP facilitated the session with a basic visual scanning task to facilitate improved attention to the left and sustained attention.  Pt required overall max assist multimodal cues to complete task for 50% accuracy.  Continue per current plan of care.    Session 2: Pt was seen for skilled ST targeting cognitive goals.  Pt was received from PT and initiated request to use the bedpan with supervision question cues.  Pt unaware of bowel incontinence episode.  SLP assisted the RN in cleaning pt as pt was  total assist for hygiene due to restraints and left inattention.  Max assist redirection required to keep pt from pulling at NG tube.  Continue per current plan of care.     FIM:  Comprehension Comprehension Mode: Auditory Comprehension: 2-Understands basic 25 - 49% of the time/requires cueing 51 - 75% of the time Expression Expression Mode: Verbal Expression: 1-Expresses basis less than 25% of the time/requires cueing greater than 75% of the time. Social Interaction Social Interaction: 1-Interacts appropriately less than 25% of the time. May be withdrawn or combative. Problem Solving Problem Solving: 1-Solves basic less than 25% of the time - needs direction nearly all the time or does not effectively solve problems and may need a restraint for safety Memory Memory: 1-Recognizes or recalls less than 25% of the time/requires cueing greater than 75% of the time  Pain Pain Assessment Session 1: Pain Assessment: No/denies pain Session 2: Pain Assessment: No/denies pain  Therapy/Group: Individual Therapy  Jackalyn LombardNicole Kimberle Stanfill, M.A. CCC-SLP  Lanell Dubie, Melanee SpryNicole L 02/03/2014, 3:45 PM

## 2014-02-03 NOTE — Progress Notes (Signed)
Social Work Assessment and Plan Social Work Assessment and Plan  Patient Details  Name: Sarah Daniel MRN: [id number] Date of Birth: Feb 22, 1922  Today's Date: 02/03/2014  Problem List:  Patient Active Problem List   Diagnosis Date Noted  . Stroke 01/29/2014  . CVA (cerebral infarction) 01/29/2014  . Hypothyroidism 01/29/2014  . Hypotension 03/08/2013  . Nausea & vomiting 03/08/2013  . Weakness generalized 03/08/2013  . Anemia 03/08/2013  . CKD (chronic kidney disease), stage III 03/08/2013  . Dementia   . Hypertension   . Thyroid disease   . Coronary artery disease    Past Medical History:  Past Medical History  Diagnosis Date  . Dementia   . Hypertension   . GERD (gastroesophageal reflux disease)   . Thyroid disease   . PUD (peptic ulcer disease)   . Coronary artery disease   . Hypothyroidism   . Arthritis    Past Surgical History:  Past Surgical History  Procedure Laterality Date  . Abdominal hysterectomy     Social History:  reports that she has quit smoking. She has never used smokeless tobacco. She reports that she does not drink alcohol or use illicit drugs.  Family / Support Systems Marital Status: Widow/Widower Patient Roles: Parent Children: Geraldine ContrasDee Dailey-daughter  [phone] Other Supports: Son in-law and granddaughter Anticipated Caregiver: Unsure at this time Ability/Limitations of Caregiver: Daughter not sure if she can provide the car ept will require at discharge Caregiver Availability: Other (Comment) (Awaiting pt's progress) Family Dynamics: Pt moved here from [place] two years ago to live with daughter here.  She was at an ALF and was falling and not eating well, so caem here to be with daughter and daughter's husband.  She does have a daughter in [place] who is supportive.  Social History Preferred language: English Religion: Methodist Cultural Background: No issues Education: [employer] Read: Yes Write: Yes Employment Status:  Retired Fish farm managerLegal Hisotry/Current Legal Issues: No issues Guardian/Conservator: Daughter is her POA looking for her forms at home.  She will bring them in when she finds them.  Pt according to MD is not capable of making her own decisions will rely upon her daughter to make any decisions while here.   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  She has had other strokes and recovered from them before.  Daughter realizes this is much worse and it will be a long road.  Daughter has been here and has spoken with pt's MD's. Recent Psychosocial Issues: other health issues, dementia Pyschiatric History: resume Substance Abuse History: No issues  Patient / Family Perceptions, Expectations & Goals Pt/Family understanding of illness & functional limitations: Daughter has a good understanding o her mOm's condition, she realizes this stroke is severe and pt has a long road to go for recovery.  She wants everything done for her to be able to recover from this.  Asked if pt has a Living Will or if daughter knows what her wishes are in case a PEG tube is needed.  Daugther to think about and wants to see how she does with getting nutrition now.  Premorbid pt/family roles/activities: Mom, Grandmother-great, church member, retiree, etc Anticipated changes in roles/activities/participation: resume Pt/family expectations/goals: Daughter states; " I am hopeful but am trying to be realistic about her recovery.  I know it will be a long road."    [employer] Community Agencies: Other (Comment) (Adult Day Care 3x week) Premorbid Home Care/DME Agencies: None Transportation available at discharge: Daughter and son in-law Resource referrals  recommended: Support group (specify)  Discharge Planning Living Arrangements: Children Support Systems: Children;Other relatives;Church/faith community Type of Residence: Private residence Insurance Resources: Media plannerrivate Insurance (specify) Passenger transport manager(Blue Medicare) Surveyor, quantityinancial Resources: Tree surgeonocial Security;Family Support Financial Screen Referred: No Living Expenses: Lives with family Money Management: Family Does the patient have any problems obtaining your medications?: No Home Management: daughter Patient/Family Preliminary Plans: Unsure at this point depends upon her progress.  Daughter is active plays tennis and is involved in her grandchildren's school.  Unsure if she can do much physical care.  She is aware of the alternatives, ie NHP and hired assistance.  Will wait to see pt's progress on rehab. Social Work Anticipated Follow Up Needs: HH/OP;SNF;Support Group  Clinical Impression Pt is not able to participate in my assessment due to the severity of her stroke.  She did ask her daughter to go to the bathroom daughter states, which is good.  Supportive family who are not quite committing to providing physical care of pt when ready to leave rehab.  Still processing pt's stroke and hospitalization and the severity of her stroke.  She is looking for her POA papers and once she finds will bring them in for us.  Will work on a discharge plan once goals are set and length of stay established.  Provide support to daughter.  Will assess pt when appropriate.  Lucy Chrisupree, Lashonne Shull G 02/03/2014, 11:05 AM

## 2014-02-03 NOTE — Progress Notes (Signed)
Patient ID: Sarah Daniel, female   DOB: 1921/08/10, 80 y.o.   MRN: [id number]  80 y.o. right handed female with history of dementia maintained on Aricept, coronary artery disease, hypertension. By report patient lives with her daughter independent prior to admission and assistance as needed. Presented 01/29/2014 after being found down by her daughter with left-sided weakness and facial droop. Patient was a poor medical historian. MRI/MRA of the brain shows acute nonhemorrhagic anterior right MCA territory infarct as well as occlusion of the right internal carotid artery with partial reconstitution of the cavernous and ophthalmic segments. CT angiogram of the neck again showing right internal carotid artery occlusion as well as proximal left subclavian artery occlusion less than 10 mm from the aortic arch. Patient did not receive TPA. Echocardiogram with ejection fraction of 17% grade 1 diastolic dysfunction. EEG with mild diffuse slowing of the waking background without seizure activity. Neurology service  consulted maintained on aspirin   Subjective/Complaints: Pt without new issues Daughter was concerned that the pt was mad at her, ignoring her, discussed Right brain cognitive deficits  Review of Systems - cannot obtain secondary to poor awareness     Objective: Vital Signs: Blood pressure 149/83, pulse 88, temperature 98.8 F (37.1 C), temperature source Oral, resp. rate 18, weight 41.8 kg (92 lb 2.4 oz), SpO2 95.00%. Dg Abd 1 View  02/02/2014   CLINICAL DATA:  Placement of feeding tube  EXAM: ABDOMEN - 1 VIEW  COMPARISON:  Abdomen film of 02/01/2014  Fluoroscopy time of 1 min 26 seconds  FINDINGS: A feeding tube was placed, with the tip at the ligament of Treitz, documented by injection of 30 cc of Omnipaque 300.  IMPRESSION: Tip of feeding tube in good position at the ligament of Treitz.   Electronically Signed   By: Ivar Drape M.D.   On: 02/02/2014 11:27   Dg Abd 1 View  02/01/2014    CLINICAL DATA:  Evaluate feeding tube placement  EXAM: ABDOMEN - 1 VIEW  COMPARISON:  02/01/2014  FINDINGS: Feeding tube has been removed and replaced in the interval. However, the tip is now located within a right bronchus. Lungs are predominantly clear. Atherosclerotic vascular calcifications. Bowel gas pattern nonspecific, nonobstructive. Previously ingested contrast within the colon. Rightward curvature of the lower thoracic/ upper lumbar spine. Multilevel degenerative changes.  IMPRESSION: Feeding tube tip within a right lung bronchus.  Recommend removal.  Emergent finding and recommendation discussed with the patient's nurse, Conrad , at 10:05 p.m. on 02/01/2014.   Electronically Signed   By: Carlos Levering M.D.   On: 02/01/2014 22:08   Dg Abd 1 View  02/01/2014   CLINICAL DATA:  Left-sided weakness.  New NG tube for feeding.  EXAM: ABDOMEN - 1 VIEW  COMPARISON:  02/01/2014  FINDINGS: The feeding tube tip is in the expected location of the distal stomach. Normal bowel gas pattern. The lungs appear clear. No airspace consolidation.  IMPRESSION: Tip of feeding tube is in the expected location of the distal stomach.   Electronically Signed   By: Kerby Moors M.D.   On: 02/01/2014 17:02   Dg Abd 1 View  02/01/2014   CLINICAL DATA:  79 year old female. Hypertension. Nausea and vomiting. Initial encounter.  EXAM: ABDOMEN - 1 VIEW  COMPARISON:  None.  Fluoroscopic time: 1 min and 9 seconds.  FINDINGS: [name] tube was placed by radiology technologist. C-arm submitted for review after procedure. This reveals that the tip of the [name] tube is in the region  of the proximal jejunum just beyond the ligament of Treitz.  IMPRESSION: Tip of the [name] tube is in the region of the proximal jejunum just beyond the ligament of Treitz.   Electronically Signed   By: Chauncey Cruel M.D.   On: 02/01/2014 10:43   Dg Addison Bailey G Tube Plc W/fl-no Rad  02/02/2014   CLINICAL DATA:    NASO G TUBE PLACEMENT WITH FLUORO   Fluoroscopy was utilized by the requesting physician.  No radiographic  interpretation.    Dg Naso G Tube Plc W/fl-no Rad  02/01/2014   CLINICAL DATA:    NASO G TUBE PLACEMENT WITH FLUORO  Fluoroscopy was utilized by the requesting physician.  No radiographic  interpretation.    Results for orders placed during the hospital encounter of 02/01/14 (from the past 72 hour(s))  GLUCOSE, CAPILLARY     Status: None   Collection Time    02/01/14  8:07 PM      Result Value Ref Range   Glucose-Capillary 89  70 - 99 mg/dL   Comment 1 Notify RN     Comment 2 Documented in Chart    CBC     Status: Abnormal   Collection Time    02/01/14  8:22 PM      Result Value Ref Range   WBC 12.1 (*) 4.0 - 10.5 K/uL   RBC 3.96  3.87 - 5.11 MIL/uL   Hemoglobin 11.6 (*) 12.0 - 15.0 g/dL   HCT 36.1  36.0 - 46.0 %   MCV 91.2  78.0 - 100.0 fL   MCH 29.3  26.0 - 34.0 pg   MCHC 32.1  30.0 - 36.0 g/dL   RDW 15.1  11.5 - 15.5 %   Platelets 330  150 - 400 K/uL  CREATININE, SERUM     Status: Abnormal   Collection Time    02/01/14  8:22 PM      Result Value Ref Range   Creatinine, Ser 0.62  0.50 - 1.10 mg/dL   GFR calc non Af Amer 77 (*) >90 mL/min   GFR calc Af Amer 89 (*) >90 mL/min   Comment: (NOTE)     The eGFR has been calculated using the CKD EPI equation.     This calculation has not been validated in all clinical situations.     eGFR's persistently <90 mL/min signify possible Chronic Kidney     Disease.  GLUCOSE, CAPILLARY     Status: Abnormal   Collection Time    02/01/14 11:56 PM      Result Value Ref Range   Glucose-Capillary 106 (*) 70 - 99 mg/dL   Comment 1 Notify RN    GLUCOSE, CAPILLARY     Status: None   Collection Time    02/02/14  4:29 AM      Result Value Ref Range   Glucose-Capillary 94  70 - 99 mg/dL   Comment 1 Notify RN    GLUCOSE, CAPILLARY     Status: None   Collection Time    02/02/14  6:54 AM      Result Value Ref Range   Glucose-Capillary 88  70 - 99 mg/dL   Comment 1  Notify RN    CBC WITH DIFFERENTIAL     Status: Abnormal   Collection Time    02/02/14  7:26 AM      Result Value Ref Range   WBC 8.0  4.0 - 10.5 K/uL   Comment: WHITE COUNT CONFIRMED ON SMEAR  RBC 3.67 (*) 3.87 - 5.11 MIL/uL   Hemoglobin 10.7 (*) 12.0 - 15.0 g/dL   HCT 33.4 (*) 36.0 - 46.0 %   MCV 91.0  78.0 - 100.0 fL   MCH 29.2  26.0 - 34.0 pg   MCHC 32.0  30.0 - 36.0 g/dL   RDW 15.1  11.5 - 15.5 %   Platelets    150 - 400 K/uL   Value: PLATELET CLUMPS NOTED ON SMEAR, COUNT APPEARS ADEQUATE   Neutrophils Relative % 80 (*) 43 - 77 %   Lymphocytes Relative 11 (*) 12 - 46 %   Monocytes Relative 9  3 - 12 %   Eosinophils Relative 0  0 - 5 %   Basophils Relative 0  0 - 1 %   Neutro Abs 6.4  1.7 - 7.7 K/uL   Lymphs Abs 0.9  0.7 - 4.0 K/uL   Monocytes Absolute 0.7  0.1 - 1.0 K/uL   Eosinophils Absolute 0.0  0.0 - 0.7 K/uL   Basophils Absolute 0.0  0.0 - 0.1 K/uL   Smear Review MORPHOLOGY UNREMARKABLE    COMPREHENSIVE METABOLIC PANEL     Status: Abnormal   Collection Time    02/02/14  7:26 AM      Result Value Ref Range   Sodium 137  137 - 147 mEq/L   Potassium 3.3 (*) 3.7 - 5.3 mEq/L   Chloride 99  96 - 112 mEq/L   CO2 19  19 - 32 mEq/L   Glucose, Bld 98  70 - 99 mg/dL   BUN 13  6 - 23 mg/dL   Creatinine, Ser 0.67  0.50 - 1.10 mg/dL   Calcium 8.6  8.4 - 10.5 mg/dL   Total Protein 6.6  6.0 - 8.3 g/dL   Albumin 3.2 (*) 3.5 - 5.2 g/dL   AST 31  0 - 37 U/L   Comment: HEMOLYSIS AT THIS LEVEL MAY AFFECT RESULT   ALT 25  0 - 35 U/L   Alkaline Phosphatase 78  39 - 117 U/L   Total Bilirubin 0.5  0.3 - 1.2 mg/dL   GFR calc non Af Amer 75 (*) >90 mL/min   GFR calc Af Amer 87 (*) >90 mL/min   Comment: (NOTE)     The eGFR has been calculated using the CKD EPI equation.     This calculation has not been validated in all clinical situations.     eGFR's persistently <90 mL/min signify possible Chronic Kidney     Disease.   Anion gap 19 (*) 5 - 15     HEENT: Left facial  droop Cardio: RRR and no murmur Resp: CTA B/L and unlabored GI: BS positive and NT, ND Extremity:  No Edema Skin:   Bruise LUE and LLE Neuro: Confused, Flat, Cranial Nerve Abnormalities Left central VII, Abnormal Sensory Left hemisensory def, Abnormal Motor 2- biceps, shoulder add, 2- L HF, KE, synergy pattern  0/5 Left ankle, Dysarthric and Inattention Musc/Skel:  Other no pain with A/PROM of UE and LE NAD, severe R gaze pref   Assessment/Plan: 1. 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 Physiatrist is providing close team supervision and 24 hour management of active medical problems listed below. Physiatrist and rehab team continue to assess barriers to discharge/monitor patient progress toward functional and medical goals. FIM: FIM - Bathing Bathing Steps Patient Completed: Chest;Left Arm;Abdomen Bathing:  2: Max-Patient completes 3-4 26f10 parts or 25-49%  FIM - Upper Body Dressing/Undressing Upper body dressing/undressing steps patient completed: Thread/unthread right sleeve of front closure shirt/dress Upper body dressing/undressing: 2: Max-Patient completed 25-49% of tasks FIM - Lower Body Dressing/Undressing Lower body dressing/undressing: 1: Total-Patient completed less than 25% of tasks  FIM - Toileting Toileting: 1: Total-Patient completed zero steps, helper did all 3  FIM - Toilet Transfers Toilet Transfers: 2-To toilet/BSC: Max A (lift and lower assist);2-From toilet/BSC: Max A (lift and lower assist)  FIM - BEngineer, siteAssistive Devices: Bed rails;Arm rests Bed/Chair Transfer: 2: Supine > Sit: Max A (lifting assist/Pt. 25-49%);3: Bed > Chair or W/C: Mod A (lift or lower assist);3: Chair or W/C > Bed: Mod A (lift or lower assist)  FIM - Locomotion: Wheelchair Distance: 75', difficulty incorporating  use of R LE, but attempting to consistently propel with R UE Locomotion: Wheelchair: 2: Travels 50 - 149 ft with maximal assistance (Pt: 25 - 49%) FIM - Locomotion: Ambulation Locomotion: Ambulation Assistive Devices: Other (comment) (R HHA) Ambulation/Gait Assistance: 2: Max assist;3: Mod assist Locomotion: Ambulation: 1: Travels less than 50 ft with maximal assistance (Pt: 25 - 49%)  Comprehension Comprehension Mode: Auditory Comprehension: 2-Understands basic 25 - 49% of the time/requires cueing 51 - 75% of the time  Expression Expression Mode: Verbal Expression: 1-Expresses basis less than 25% of the time/requires cueing greater than 75% of the time.  Social Interaction Social Interaction: 1-Interacts appropriately less than 25% of the time. May be withdrawn or combative.  Problem Solving Problem Solving: 1-Solves basic less than 25% of the time - needs direction nearly all the time or does not effectively solve problems and may need a restraint for safety  Memory Memory: 1-Recognizes or recalls less than 25% of the time/requires cueing greater than 75% of the time  Medical Problem List and Plan: 1. Functional deficits secondary to right MCA infarct with dense left hemiparesis and visual-spatial deficits 2.  DVT Prophylaxis/Anticoagulation: Subcutaneous Lovenox. Monitor platelet counts and any signs of bleeding 3. Pain Management: Ultram as needed. 4. Mood/depression/dementia. Aricept 10 mg daily at bedtime, Effexor 75 mg daily. Discussed baseline cognition with family 5. Neuropsych: This patient is not capable of making decisions on her own behalf. 6. Skin/Wound Care: Routine skin checks as well as routine turning to maintain skin integrity 7. Fluids/Electrolytes/Nutrition: Follow-up chemistries. Strict I and O's.Hx CKD, monitor renal status 8. Dysphagia. Nasogastric tube feeds. Follow-up speech therapy. IV fluids to maintain hydration             -panda will need to be replaced  today 9. Hypothyroidism. Synthroid. Latest TSH level 2.929 10. GERD. Protonix 11. Hx CAD, ASA   12.  Mild hypokalemia LOS (Days) 2 A FACE TO FACE EVALUATION WAS PERFORMED  Kabrea Seeney E 02/03/2014, 10:04 AM

## 2014-02-03 NOTE — Progress Notes (Signed)
Occupational Therapy Session Note  Patient Details  Name: Sarah Daniel MRN: [id number] Date of Birth: [birth year]  Today's Date: 02/03/2014 OT Individual Time: 1100-1200 OT Individual Time Calculation (min): 60 min    Short Term Goals: Week 1:  OT Short Term Goal 1 (Week 1): Pt will perform grooming with min A at sink in order to increase self care. OT Short Term Goal 2 (Week 1): Pt will perform bathing with Mod A in order to increase I with self care. OT Short Term Goal 3 (Week 1): Pt will perform UB dressing with Mod A  utilizing modifcations as needed in order to I self care. OT Short Term Goal 4 (Week 1): Pt will perform clothing management with assistance for balance during toileting tasks.  OT Short Term Goal 5 (Week 1): Pt will locate items to the L during ADL tasks with min verbal cues during bathing and dressing session.   Skilled Therapeutic Interventions/Progress Updates:    Pt transitioned easily from speech therapy session. Pt supine in bed with restraint mitt on as pt continues to pull at feeding tube. Therapist removed restraint mitt during session for pt to actively participate. Pt reports to therapist, "Tired." Pt having difficulty remaining alert during session and requiring max verbal cues throughout to attend to task. Pt seated EOB, to wash UB and face with assistance and items placed at midline. Pt with neck rotated to R and strong R gaze preference. Pt able to find items at midline 1/8 times during session with increased time and max verbal cues. Pt began pushing on R bed rail in an attempt to stand throughout time sitting on bed and needing to be redirected for safety. LB dressing performed in supine as pt began to pull on feeding tube and mitt donned. OT performed PROM on L UE for digits,wrist,elbow, and shoulder in all planes x 5 reps. Pt tolerated well with no c/o pain. Pt supine in bed, bed alarm on, call bell within reach upon exiting the room.   Therapy  Documentation Precautions:  Precautions Precautions: Fall Precaution Comments: Severe L inattention Restrictions Weight Bearing Restrictions: No Pain: Pain Assessment Pain Assessment: No/denies pain   Therapy/Group: Individual Therapy  Lowella Gripittman, Gerene Nedd L 02/03/2014, 4:17 PM

## 2014-02-03 NOTE — Plan of Care (Signed)
Problem: RH BOWEL ELIMINATION Goal: RH STG MANAGE BOWEL WITH ASSISTANCE STG Manage Bowel with max Assistance.  Outcome: Not Progressing LBM 01-30-14 Goal: RH STG MANAGE BOWEL W/MEDICATION W/ASSISTANCE STG Manage Bowel with Medication with mod Assistance.  Outcome: Not Progressing LBM 01-30-14 Goal: RH STG MANAGE BOWEL W/EQUIPMENT W/ASSISTANCE STG Manage Bowel With Equipment With mod Assistance  Outcome: Not Progressing LBM 01-30-14

## 2014-02-03 NOTE — Progress Notes (Signed)
Physical Therapy Session Note  Patient Details  Name: Sarah Daniel MRN: [id number] Date of Birth: 06-Feb-1922  Today's Date: 02/03/2014 PT Individual Time: 1400-1500 PT Individual Time Calculation (min): 60 min   Short Term Goals: Week 1:  PT Short Term Goal 1 (Week 1): Pt to perform bed mobility with mod Ax1person PT Short Term Goal 2 (Week 1): Pt to perform bed<>w/c transfers with min A, 50% of time PT Short Term Goal 3 (Week 1): Pt to propel wheelchair 100' with mod Ax1person PT Short Term Goal 4 (Week 1): Pt to ambulate 1550' with mod Ax1person PT Short Term Goal 5 (Week 1): Pt to track head/eyes to midline during functional tasks with mod multimodal cues, 50% of time  Skilled Therapeutic Interventions/Progress Updates:   Daughter Sarah Daniel observed and assisted in activities to stimulate L attention.  Pt supine in bed with HOB 30 degrees, strong R cervical rotation.    neuromuscular re-education via demo, multi-modal cues for L attention, L visual tracking, L head turn, during: - R sideling > supine > L sidelying in preparation for transfer stand pivot with mod/max assist to R -reaching activity with R hand in sitting, crossing midline to L to place on table; with practice, pt demonstrated brief visual tracking to L 3 times, and turned head 4 times out of 8 -gait with railing on R, x 30' with mod assist for upright trunk ; pt progressed LLE independently .  Pt demonstrated slight pushing tendency with R hand toward L in standing.  Pt noted to have had bowel accident.  Returned to room; max assist squat pivot to R and sit> supine.  +2 assist to scoot pt up in bed. Rolling R using R hand and railing.  Secured pt's R hand mitt as a restraint.  Instructed daughter in retrograde massage for pt's edematous L hand.  RN informed and bed alarm set, all needs within reach.  Daughter present.    Therapy Documentation Precautions:  Precautions Precautions: Fall Precaution Comments: Severe L  inattention Restrictions Weight Bearing Restrictions: No   Vital Signs: Therapy Vitals Pulse Rate: 76 BP: 137/63 mmHg Patient Position (if appropriate): sitting Oxygen Therapy SpO2: 96 % O2 Device: Not Delivered Pain: Pain Assessment Pain Assessment: No/denies pain   Locomotion : Ambulation Ambulation/Gait Assistance: 3: Mod assist       See FIM for current functional status  Therapy/Group: Individual Therapy  Kendrick Haapala 02/03/2014, 4:10 PM

## 2014-02-04 ENCOUNTER — Inpatient Hospital Stay (HOSPITAL_COMMUNITY): Payer: Medicare Other | Admitting: Speech Pathology

## 2014-02-04 ENCOUNTER — Inpatient Hospital Stay (HOSPITAL_COMMUNITY): Payer: Medicare Other | Admitting: Physical Therapy

## 2014-02-04 ENCOUNTER — Inpatient Hospital Stay (HOSPITAL_COMMUNITY): Payer: Medicare Other | Admitting: Occupational Therapy

## 2014-02-04 MED ORDER — VENLAFAXINE HCL 37.5 MG PO TABS
37.5000 mg | ORAL_TABLET | Freq: Two times a day (BID) | ORAL | Status: DC
  Administered 2014-02-04 – 2014-02-11 (×13): 37.5 mg via NASOGASTRIC
  Filled 2014-02-04 (×17): qty 1

## 2014-02-04 NOTE — Progress Notes (Signed)
Speech Language Pathology Daily Session Note  Patient Details  Name: Meris [place] MRN: [id number] Date of Birth: [birth year]  Today's Date: 02/04/2014 SLP Individual Time: 0902-1002 SLP Individual Time Calculation (min): 60 min  Short Term Goals: Week 1: SLP Short Term Goal 1 (Week 1): Pt will demonstrate an increased swallow initiation with Max A multimodal cues to utilize swallowing compensatory strategies. SLP Short Term Goal 2 (Week 1): Pt will demonstrated increased sustained attention with Max A multimodal cues. SLP Short Term Goal 3 (Week 1): Pt will utilize external memory aids to increase orientation with Max A multimodal cues. SLP Short Term Goal 4 (Week 1): Pt will demonstrate functional problem solving for basic and familiar tasks with Max A multimodal cues.  SLP Short Term Goal 5 (Week 1): Pt will use call bell to request assistance with Max A multimodal cues. SLP Short Term Goal 6 (Week 1): Pt will utilize over articulation and increased vocal intensity to increase speech intelligibility with Max A multimodal cues.   Skilled Therapeutic Interventions:  Pt was seen for skilled ST targeting cognitive goals.  Upon arrival, pt was partially reclined in bed, awake, lethargic, and agreeable to participate in ST.  SLP transferred pt to edge of bed to maximize alertness during cognitive tasks.  Pt oriented to place but required max assist to reorient to date and situation.  Pt required hand over hand assist to locate and grab items when placed at midline with SLP able to intermittently fade cuing to max assist.  Pt with increased anterior labial spillage of saliva when seated upright; however, slight improvements were noted for use of increased vocal intensity to improve speech intelligibility at the phrase level with max cuing from SLP.  Continue per current plan of care.    FIM:  Comprehension Comprehension Mode: Auditory Comprehension: 2-Understands basic 25 - 49% of the time/requires  cueing 51 - 75% of the time Expression Expression Mode: Verbal Expression: 2-Expresses basic 25 - 49% of the time/requires cueing 50 - 75% of the time. Uses single words/gestures. Social Interaction Social Interaction: 1-Interacts appropriately less than 25% of the time. May be withdrawn or combative. Problem Solving Problem Solving: 1-Solves basic less than 25% of the time - needs direction nearly all the time or does not effectively solve problems and may need a restraint for safety Memory Memory: 1-Recognizes or recalls less than 25% of the time/requires cueing greater than 75% of the time  Pain Pain Assessment Pain Assessment: No/denies pain  Jackalyn LombardNicole Harrel Ferrone, M.A. CCC-SLP  Therapy/Group: Individual Therapy  Jumar Greenstreet, Melanee SpryNicole L 02/04/2014, 12:36 PM

## 2014-02-04 NOTE — IPOC Note (Signed)
Overall Plan of Care [facility](IPOC) Patient Details Name: Sarah Daniel MRN: [id number] DOB: [birth year]  Admitting Diagnosis: R MCA CV[facility]  [facility]   [facility] Problems: Active Problems:   CVA (cerebral infarction)     Functional Problem List: Nursing Bladder;Bowel;Endurance;Medication Management;Motor;Nutrition;Pain;Safety;Skin Integrity;Sensory  PT Balance;Endurance;Motor;Pain;Nutrition;Perception;Safety;Sensory;Skin Integrity;Other (comment);Edema (strength)  OT Balance;Cognition;Endurance;Motor;Safety;Sensory;Vision  SLP Cognition;Nutrition;Linguistic  TR         Basic ADL's: OT Eating;Grooming;Bathing;Dressing;Toileting     Advanced  ADL's: OT Other (comment) (n/a)     Transfers: PT Bed Mobility;Bed to Chair;Car;Furniture  OT Toilet;Tub/Shower     Locomotion: PT Ambulation;Wheelchair Mobility;Stairs     Additional Impairments: OT Other (comment) (use of L UE as stabilizer during functional tasks)  SLP Swallowing;Communication;Social Cognition expression Problem Solving;Memory;Attention;Awareness  TR      Anticipated Outcomes Item Anticipated Outcome  Self Feeding set up A  Swallowing  Mod A   Basic self-care  supervision - Min A  Toileting  Min A   Bathroom Transfers Min A  Bowel/Bladder  Pt will be continent of bowel and bladder with min assist.  Transfers  Supervision-Min A   Locomotion  Supervision-Min A  Communication  Mod A   Cognition  Mod A   Pain  Pain will be equal to or less than 3 on a scale of 0-10.  Safety/Judgment  Pt will be free from fall and injures with min assist   Therapy Plan: PT Intensity: Minimum of 1-2 x/day ,45 to 90 minutes PT Frequency: 5 out of 7 days PT Duration Estimated Length of Stay: 25-28days OT Intensity: Minimum of 1-2 x/day, 45 to 90 minutes OT Frequency: 5 out of 7 days OT Duration/Estimated Length of Stay: 25-28 days SLP Intensity: Minumum of 1-2 x/day, 30 to 90 minutes SLP Frequency: 5 out of 7 days SLP  Duration/Estimated Length of Stay: 22-30 days       Team Interventions: Nursing Interventions Patient/Family Education;Bladder Management;Bowel Management;Pain Management;Medication Management;Skin Care/Wound Management;Cognitive Remediation/Compensation;Dysphagia/Aspiration Precaution Training;Discharge Planning  PT interventions Ambulation/gait training;Community reintegration;DME/adaptive equipment instruction;Psychosocial support;Neuromuscular re-education;Stair training;UE/LE Strength taining/ROM;Wheelchair propulsion/positioning;UE/LE Coordination activities;Therapeutic Activities;Skin care/wound management;Pain management;Functional electrical stimulation;Discharge planning;Balance/vestibular training;Cognitive remediation/compensation;Disease management/prevention;Functional mobility training;Patient/family education;Splinting/orthotics;Therapeutic Exercise;Visual/perceptual remediation/compensation  OT Interventions Balance/vestibular training;Discharge planning;Self Care/advanced ADL retraining;Therapeutic Activities;UE/LE Coordination activities;Cognitive remediation/compensation;Functional mobility training;Patient/family education;Therapeutic Exercise;Wheelchair propulsion/positioning;UE/LE Strength taining/ROM;Psychosocial support;Neuromuscular re-education;DME/adaptive equipment instruction  SLP Interventions Cognitive remediation/compensation;Cueing hierarchy;Dysphagia/aspiration precaution training;Patient/family education;Oral motor exercises;Functional tasks;Internal/external aids;Therapeutic Activities  TR Interventions    SW/CM Interventions Discharge Planning;Psychosocial Support;Patient/Family Education    Team Discharge Planning: Destination: PT-Home ,OT- Home (home vs snf pending progress) , SLP-Home Projected Follow-up: PT-Home health PT, OT-  Other (comment) (Pt will need follow up services TBD pending progress), SLP-Outpatient SLP;24 hour supervision/assistance Projected  Equipment Needs: PT-To be determined, OT- To be determined, SLP-To be determined Equipment Details: PT- , OT-  Patient/family involved in discharge planning: PT- Patient;Family member/caregiver,  OT-Patient;Family member/caregiver, SLP-Family member/caregiver;Patient  MD ELOS: 18-21d Medical Rehab Prognosis:  Fair Assessment: 80 y.o. right handed female with history of dementia maintained on Aricept, coronary artery disease, hypertension. By report patient lives with her daughter independent prior to admission and assistance as needed. Presented 01/29/2014 after being found down by her daughter with left-sided weakness and facial droop. Patient was a poor medical historian. MRI/MRA of the brain shows acute nonhemorrhagic anterior right MCA territory infarct as well as occlusion of the right internal carotid artery with partial reconstitution of the cavernous and ophthalmic segments. CT angiogram of the neck again showing right internal carotid artery occlusion as well as proximal  left subclavian artery occlusion less than 10 mm from the aortic arch. Patient did not receive TPA. Echocardiogram with ejection fraction of 60% grade 1 diastolic dysfunction  Now requiring 24/7 Rehab RN,MD, as well as CIR level PT, OT and SLP.  Treatment team will focus on ADLs and mobility [facility]   See Team Conference Notes for weekly updates to the plan of care

## 2014-02-04 NOTE — Progress Notes (Signed)
Occupational Therapy Session Note  Patient Details  Name: Sarah Daniel MRN: [id number] Date of Birth: December 30, 1921  Today's Date: 02/04/2014 OT Individual Time: [id number] OT Individual Time Calculation (min): 60 min    Short Term Goals: Week 1:  OT Short Term Goal 1 (Week 1): Pt will perform grooming with min A at sink in order to increase self care. OT Short Term Goal 2 (Week 1): Pt will perform bathing with Mod A in order to increase I with self care. OT Short Term Goal 3 (Week 1): Pt will perform UB dressing with Mod A  utilizing modifcations as needed in order to I self care. OT Short Term Goal 4 (Week 1): Pt will perform clothing management with assistance for balance during toileting tasks.  OT Short Term Goal 5 (Week 1): Pt will locate items to the L during ADL tasks with min verbal cues during bathing and dressing session.   Skilled Therapeutic Interventions/Progress Updates:    Engaged in ADL retraining with focus on sitting balance and Lt attention.  Pt supine bed with extreme Rt head turn and gaze preference with daughter providing massage to LUE.  Engaged in simple stretching of neck to promote head rotation to midline, with pt c/o pain with head turn.  Engaged in bathing and dressing seated at EOB with focus on obtaining items at midline.  Pt unable to turn head or eyes to midline to locate basin and washcloth requiring assist to turn head.  Manual facilitation to facilitate upright sitting balance and midline orientation.  Pt unable to locate Rt sleeve of shirt even just to Rt of visual field, requiring tactile cues and attempting visual tracking to locate shirt.  LB bathing and dressing completed in bed with focus on bridging and rolling to pull pants over hips.   Therapy Documentation Precautions:  Precautions Precautions: Fall Precaution Comments: Severe L inattention Restrictions Weight Bearing Restrictions: No Pain:  Pt with c/o pain in neck when turning to Lt, not  rated  See FIM for current functional status  Therapy/Group: Individual Therapy  Rosalio LoudHOXIE, Carlethia Mesquita 02/04/2014, 10:36 AM

## 2014-02-04 NOTE — Progress Notes (Signed)
Physical Therapy Session Note  Patient Details  Name: Sarah Daniel MRN: [id number] Date of Birth: 1921-06-17  Today's Date: 02/04/2014 PT Individual Time: 1032-1146 PT Individual Time Calculation (min): 74 min   Short Term Goals: Week 1:  PT Short Term Goal 1 (Week 1): Pt to perform bed mobility with mod Ax1person PT Short Term Goal 2 (Week 1): Pt to perform bed<>w/c transfers with min A, 50% of time PT Short Term Goal 3 (Week 1): Pt to propel wheelchair 100' with mod Ax1person PT Short Term Goal 4 (Week 1): Pt to ambulate 4750' with mod Ax1person PT Short Term Goal 5 (Week 1): Pt to track head/eyes to midline during functional tasks with mod multimodal cues, 50% of time  Skilled Therapeutic Interventions/Progress Updates:    Pt received semi reclined in bed; asleep but able to arouse with increased time. Pt agreeable to therapy. After this therapist reoriented pt, pt asked, "You said I've had a stroke?" twice during remainder of session. Session focused on postural awareness/control, visually scanning to L of midline, and promoting proper alignment while seated in w/c. Pt performed supine>sit (to L side) with max A with HOB elevated using rail; multimodal cueing to initiate movement of RUE across midline to position on bed rail, to advance LLE toward EOB; pt automatically initiated transition from R side lying to seated EOB with lifting assist at trunk. Transported pt to gym in w/c with total A for energy conservation. In treatment gym, performed squat pivot transfers from bed>w/c<>mat table with mod-max A, max multimodal cueing for setup, hand placement, and sequencing. See below for detailed description of NMR.  In seated/standing, pt with thoracic hyperkyphosis, significant cervical spine flexion, and complaints of neck pain with increased time OOB. Therefore, modified standard w/c to relining w/c to utilize gravity to improve postural alignment. Once repositioned in reclining w/c, pt with  decreased pain in cervical spine. Added L lap tray to w/c for should protection and midline posture. Departed with pt reclined in w/c with quick release belt in place for safety and RN present administering medication. RN agreeable to transporting pt to nurses' station (per safety plan) when finished.   Therapy Documentation Precautions:  Precautions Precautions: Fall Precaution Comments: Severe L inattention Restrictions Weight Bearing Restrictions: No Pain: Pain Assessment Pain Assessment: PAINAD Faces Pain Scale: Hurts little more Pain Type: Acute pain Pain Location: Neck Pain Orientation: Posterior;Mid Pain Descriptors / Indicators: Aching Pain Onset: Gradual Pain Intervention(s): Repositioned;Other (Comment) (modified w/c) Multiple Pain Sites: No NMR: Treatments Neuromuscular Facilitation: Activity to increase motor control;Activity to increase anterior-posterior weight shifting;Activity to increase lateral weight shifting;Activity to increase sustained activation;Other (comment) (Activity to increase postural awareness/control, attention to L visual field) Weight Bearing Technique Weight Bearing Technique: Yes LUE Weight Bearing Technique: Extended arm seated Seated EOM with LUE support, performed RUE anterolateral reaching for colored rings with constant verbal/tactile cueing for sustained attention, visual tracking.  Cued pt to name color of rings prior to reaching to promote cervical/thoracic extension and visual attention. Pt then cued to visually scan to midline to place rings on posts. Pt required [facility] assist and manual facilitation to move rings from R side to midline to locate posts. Pt required SBA to min A for static sitting balance with bilat UE/LE support due to intermittent LOB to L side. Unable to manually facilitate midline head posture in seated.  See FIM for current functional status  Therapy/Group: Individual Therapy  Kailin Principato, Lorenda IshiharaBlair A 02/04/2014, 12:01 PM

## 2014-02-04 NOTE — Progress Notes (Signed)
Patient ID: Sarah Daniel, female   DOB: 01-12-1922, 80 y.o.   MRN: [id number]  80 y.o. right handed female with history of dementia maintained on Aricept, coronary artery disease, hypertension. By report patient lives with her daughter independent prior to admission and assistance as needed. Presented 01/29/2014 after being found down by her daughter with left-sided weakness and facial droop. Patient was a poor medical historian. MRI/MRA of the brain shows acute nonhemorrhagic anterior right MCA territory infarct as well as occlusion of the right internal carotid artery with partial reconstitution of the cavernous and ophthalmic segments. CT angiogram of the neck again showing right internal carotid artery occlusion as well as proximal left subclavian artery occlusion less than 10 mm from the aortic arch. Patient did not receive TPA. Echocardiogram with ejection fraction of 40% grade 1 diastolic dysfunction. EEG with mild diffuse slowing of the waking background without seizure activity. Neurology service  consulted maintained on aspirin   Subjective/Complaints: Unable to crush effexor XR Very fatigued after OT  Review of Systems - cannot obtain secondary to poor awareness     Objective: Vital Signs: Blood pressure 117/82, pulse 100, temperature 98.5 F (36.9 C), temperature source Oral, resp. rate 18, weight 41.5 kg (91 lb 7.9 oz), SpO2 98.00%. Dg Abd 1 View  02/02/2014   CLINICAL DATA:  Placement of feeding tube  EXAM: ABDOMEN - 1 VIEW  COMPARISON:  Abdomen film of 02/01/2014  Fluoroscopy time of 1 min 26 seconds  FINDINGS: A feeding tube was placed, with the tip at the ligament of Treitz, documented by injection of 30 cc of Omnipaque 300.  IMPRESSION: Tip of feeding tube in good position at the ligament of Treitz.   Electronically Signed   By: Ivar Drape M.D.   On: 02/02/2014 11:27   Dg Addison Bailey G Tube Plc W/fl-no Rad  02/02/2014   CLINICAL DATA:    NASO G TUBE PLACEMENT WITH FLUORO  Fluoroscopy  was utilized by the requesting physician.  No radiographic  interpretation.    Results for orders placed during the hospital encounter of 02/01/14 (from the past 72 hour(s))  GLUCOSE, CAPILLARY     Status: None   Collection Time    02/01/14  8:07 PM      Result Value Ref Range   Glucose-Capillary 89  70 - 99 mg/dL   Comment 1 Notify RN     Comment 2 Documented in Chart    CBC     Status: Abnormal   Collection Time    02/01/14  8:22 PM      Result Value Ref Range   WBC 12.1 (*) 4.0 - 10.5 K/uL   RBC 3.96  3.87 - 5.11 MIL/uL   Hemoglobin 11.6 (*) 12.0 - 15.0 g/dL   HCT 36.1  36.0 - 46.0 %   MCV 91.2  78.0 - 100.0 fL   MCH 29.3  26.0 - 34.0 pg   MCHC 32.1  30.0 - 36.0 g/dL   RDW 15.1  11.5 - 15.5 %   Platelets 330  150 - 400 K/uL  CREATININE, SERUM     Status: Abnormal   Collection Time    02/01/14  8:22 PM      Result Value Ref Range   Creatinine, Ser 0.62  0.50 - 1.10 mg/dL   GFR calc non Af Amer 77 (*) >90 mL/min   GFR calc Af Amer 89 (*) >90 mL/min   Comment: (NOTE)     The eGFR has been calculated using  the CKD EPI equation.     This calculation has not been validated in all clinical situations.     eGFR's persistently <90 mL/min signify possible Chronic Kidney     Disease.  GLUCOSE, CAPILLARY     Status: Abnormal   Collection Time    02/01/14 11:56 PM      Result Value Ref Range   Glucose-Capillary 106 (*) 70 - 99 mg/dL   Comment 1 Notify RN    GLUCOSE, CAPILLARY     Status: None   Collection Time    02/02/14  4:29 AM      Result Value Ref Range   Glucose-Capillary 94  70 - 99 mg/dL   Comment 1 Notify RN    GLUCOSE, CAPILLARY     Status: None   Collection Time    02/02/14  6:54 AM      Result Value Ref Range   Glucose-Capillary 88  70 - 99 mg/dL   Comment 1 Notify RN    CBC WITH DIFFERENTIAL     Status: Abnormal   Collection Time    02/02/14  7:26 AM      Result Value Ref Range   WBC 8.0  4.0 - 10.5 K/uL   Comment: WHITE COUNT CONFIRMED ON SMEAR   RBC  3.67 (*) 3.87 - 5.11 MIL/uL   Hemoglobin 10.7 (*) 12.0 - 15.0 g/dL   HCT 33.4 (*) 36.0 - 46.0 %   MCV 91.0  78.0 - 100.0 fL   MCH 29.2  26.0 - 34.0 pg   MCHC 32.0  30.0 - 36.0 g/dL   RDW 15.1  11.5 - 15.5 %   Platelets    150 - 400 K/uL   Value: PLATELET CLUMPS NOTED ON SMEAR, COUNT APPEARS ADEQUATE   Neutrophils Relative % 80 (*) 43 - 77 %   Lymphocytes Relative 11 (*) 12 - 46 %   Monocytes Relative 9  3 - 12 %   Eosinophils Relative 0  0 - 5 %   Basophils Relative 0  0 - 1 %   Neutro Abs 6.4  1.7 - 7.7 K/uL   Lymphs Abs 0.9  0.7 - 4.0 K/uL   Monocytes Absolute 0.7  0.1 - 1.0 K/uL   Eosinophils Absolute 0.0  0.0 - 0.7 K/uL   Basophils Absolute 0.0  0.0 - 0.1 K/uL   Smear Review MORPHOLOGY UNREMARKABLE    COMPREHENSIVE METABOLIC PANEL     Status: Abnormal   Collection Time    02/02/14  7:26 AM      Result Value Ref Range   Sodium 137  137 - 147 mEq/L   Potassium 3.3 (*) 3.7 - 5.3 mEq/L   Chloride 99  96 - 112 mEq/L   CO2 19  19 - 32 mEq/L   Glucose, Bld 98  70 - 99 mg/dL   BUN 13  6 - 23 mg/dL   Creatinine, Ser 0.67  0.50 - 1.10 mg/dL   Calcium 8.6  8.4 - 10.5 mg/dL   Total Protein 6.6  6.0 - 8.3 g/dL   Albumin 3.2 (*) 3.5 - 5.2 g/dL   AST 31  0 - 37 U/L   Comment: HEMOLYSIS AT THIS LEVEL MAY AFFECT RESULT   ALT 25  0 - 35 U/L   Alkaline Phosphatase 78  39 - 117 U/L   Total Bilirubin 0.5  0.3 - 1.2 mg/dL   GFR calc non Af Amer 75 (*) >90 mL/min   GFR calc Af  Amer 87 (*) >90 mL/min   Comment: (NOTE)     The eGFR has been calculated using the CKD EPI equation.     This calculation has not been validated in all clinical situations.     eGFR's persistently <90 mL/min signify possible Chronic Kidney     Disease.   Anion gap 19 (*) 5 - 15     HEENT: Left facial droop Cardio: RRR and no murmur Resp: CTA B/L and unlabored GI: BS positive and NT, ND Extremity:  No Edema Skin:   Bruise LUE and LLE Neuro: Confused, Flat, Cranial Nerve Abnormalities Left central VII,  Abnormal Sensory Left hemisensory def, Abnormal Motor 2- biceps, shoulder add, 2- L HF, KE, synergy pattern  0/5 Left ankle, Dysarthric and Inattention Musc/Skel:  Other no pain with A/PROM of UE and LE NAD, severe R gaze pref   Assessment/Plan: 1. 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 Physiatrist is providing close team supervision and 24 hour management of active medical problems listed below. Physiatrist and rehab team continue to assess barriers to discharge/monitor patient progress toward functional and medical goals. FIM: FIM - Bathing Bathing Steps Patient Completed: Chest;Left Arm Bathing: 1: Total-Patient completes 0-2 of 10 parts or less than 25%  FIM - Upper Body Dressing/Undressing Upper body dressing/undressing steps patient completed: Thread/unthread right sleeve of front closure shirt/dress Upper body dressing/undressing: 2: Max-Patient completed 25-49% of tasks FIM - Lower Body Dressing/Undressing Lower body dressing/undressing: 1: Total-Patient completed less than 25% of tasks  FIM - Toileting Toileting: 1: Total-Patient completed zero steps, helper did all 3  FIM - Toilet Transfers Toilet Transfers: 2-To toilet/BSC: Max A (lift and lower assist);2-From toilet/BSC: Max A (lift and lower assist)  FIM - Engineer, site Assistive Devices: Bed rails;Arm rests Bed/Chair Transfer: 2: Chair or W/C > Bed: Max A (lift and lower assist);2: Sit > Supine: Max A (lifting assist/Pt. 25-49%);3: Supine > Sit: Mod A (lifting assist/Pt. 50-74%/lift 2 legs;3: Bed > Chair or W/C: Mod A (lift or lower assist)  FIM - Locomotion: Wheelchair Distance: 75', difficulty incorporating use of R LE, but attempting to consistently propel with R UE Locomotion: Wheelchair: 1: Total Assistance/staff pushes wheelchair (Pt<25%) FIM -  Locomotion: Ambulation Locomotion: Ambulation Assistive Devices: Other (comment) (R railing in hallway) Ambulation/Gait Assistance: 3: Mod assist Locomotion: Ambulation: 1: Travels less than 50 ft with moderate assistance (Pt: 50 - 74%)  Comprehension Comprehension Mode: Auditory Comprehension: 2-Understands basic 25 - 49% of the time/requires cueing 51 - 75% of the time  Expression Expression Mode: Verbal Expression: 1-Expresses basis less than 25% of the time/requires cueing greater than 75% of the time.  Social Interaction Social Interaction: 1-Interacts appropriately less than 25% of the time. May be withdrawn or combative.  Problem Solving Problem Solving: 1-Solves basic less than 25% of the time - needs direction nearly all the time or does not effectively solve problems and may need a restraint for safety  Memory Memory: 1-Recognizes or recalls less than 25% of the time/requires cueing greater than 75% of the time  Medical Problem List and Plan: 1. Functional deficits secondary to right MCA infarct with dense left hemiparesis and visual-spatial deficits 2.  DVT Prophylaxis/Anticoagulation: Subcutaneous Lovenox. Monitor platelet counts and any signs of bleeding 3. Pain Management: Ultram as needed. 4. Mood/depression/dementia. Aricept 10 mg daily at bedtime, Effexor 37.5 mg daily. Discussed difficulty with  evaluation of depression in setting of large CVA     5. Neuropsych: This patient is not capable of making decisions on her own behalf. 6. Skin/Wound Care: Routine skin checks as well as routine turning to maintain skin integrity 7. Fluids/Electrolytes/Nutrition: Follow-up chemistries. Strict I and O's.Hx CKD, monitor renal status 8. Dysphagia. Nasogastric tube feeds. Follow-up speech therapy. IV fluids to maintain hydration             -panda will need to be secured, decision for PEG in ~1wk 9. Hypothyroidism. Synthroid. Latest TSH level 2.929 10. GERD. Protonix 11. Hx CAD,  ASA   12.  Mild hypokalemia LOS (Days) 3 A FACE TO FACE EVALUATION WAS PERFORMED  Sarah Daniel 02/04/2014, 8:12 AM

## 2014-02-05 ENCOUNTER — Inpatient Hospital Stay (HOSPITAL_COMMUNITY): Payer: Medicare Other | Admitting: Speech Pathology

## 2014-02-05 ENCOUNTER — Inpatient Hospital Stay (HOSPITAL_COMMUNITY): Payer: Medicare Other | Admitting: *Deleted

## 2014-02-05 ENCOUNTER — Inpatient Hospital Stay (HOSPITAL_COMMUNITY): Payer: Medicare Other | Admitting: Physical Therapy

## 2014-02-05 DIAGNOSIS — I10 Essential (primary) hypertension: Secondary | ICD-10-CM

## 2014-02-05 NOTE — Progress Notes (Signed)
Speech Language Pathology Daily Session Note  Patient Details  Name: Sarah Daniel MRN: [id number] Date of Birth: 03-Dec-1921  Today's Date: 02/05/2014 SLP Individual Time: 1017-1102 SLP Individual Time Calculation (min): 45 min  Short Term Goals: Week 1: SLP Short Term Goal 1 (Week 1): Pt will demonstrate an increased swallow initiation with Max A multimodal cues to utilize swallowing compensatory strategies. SLP Short Term Goal 2 (Week 1): Pt will demonstrated increased sustained attention with Max A multimodal cues. SLP Short Term Goal 3 (Week 1): Pt will utilize external memory aids to increase orientation with Max A multimodal cues. SLP Short Term Goal 4 (Week 1): Pt will demonstrate functional problem solving for basic and familiar tasks with Max A multimodal cues.  SLP Short Term Goal 5 (Week 1): Pt will use call bell to request assistance with Max A multimodal cues. SLP Short Term Goal 6 (Week 1): Pt will utilize over articulation and increased vocal intensity to increase speech intelligibility with Max A multimodal cues.   Skilled Therapeutic Interventions: Skilled treatment session focused on cognitive-linguistic goals. Upon arrival, patient was asleep while supine in bed and required Max A verbal and tactile cues for arousal. Patient demonstrated focused attention for ~10 seconds throughout the session with Max A multimodal cues. Patient also required total hand over hand assist to initiate functional, self-care tasks. Patient demonstrated a right gaze preference throughout the session and was unable to scan to midline despite total A multimodal cues. Patient answered basic questions in regards to orientation at the word level with ~75% intelligibility and was independently oriented to place and situation. Patient also demonstrated intermittent intellectual awareness by reporting, "I fell apart" and independently reported she had a headache and was able to rate her pain (1-10) with extra  time. Patient left in bed with bed alarm on and all needs within reach. Continue with current plan of care.    FIM:  Comprehension Comprehension Mode: Auditory Comprehension: 2-Understands basic 25 - 49% of the time/requires cueing 51 - 75% of the time Expression Expression Mode: Verbal Expression: 1-Expresses basis less than 25% of the time/requires cueing greater than 75% of the time. Social Interaction Social Interaction: 1-Interacts appropriately less than 25% of the time. May be withdrawn or combative. Problem Solving Problem Solving: 1-Solves basic less than 25% of the time - needs direction nearly all the time or does not effectively solve problems and may need a restraint for safety Memory Memory: 1-Recognizes or recalls less than 25% of the time/requires cueing greater than 75% of the time  Pain Pain Assessment Pain Assessment: 0-10 Pain Score: 3  Pain Type: Acute pain Pain Location: Head Pain Descriptors / Indicators: Aching Pain Onset: Gradual Patients Stated Pain Goal: 0 Pain Intervention(s): RN made aware Multiple Pain Sites: No  Therapy/Group: Individual Therapy  Sarah Daniel 02/05/2014, 11:10 AM

## 2014-02-05 NOTE — Progress Notes (Signed)
Occupational Therapy Session Note  Patient Details  Name: Sarah [place] MRN: [id number] Date of Birth: 09-20-1921  Today's Date: 02/05/2014 OT Individual Time: 1115-1200 OT Individual Time Calculation (min): 45 min    Short Term Goals: Week 1:  OT Short Term Goal 1 (Week 1): Pt will perform grooming with min A at sink in order to increase self care. OT Short Term Goal 2 (Week 1): Pt will perform bathing with Mod A in order to increase I with self care. OT Short Term Goal 3 (Week 1): Pt will perform UB dressing with Mod A  utilizing modifcations as needed in order to I self care. OT Short Term Goal 4 (Week 1): Pt will perform clothing management with assistance for balance during toileting tasks.  OT Short Term Goal 5 (Week 1): Pt will locate items to the L during ADL tasks with min verbal cues during bathing and dressing session.   Skilled Therapeutic Interventions/Progress Updates:    1st session:  Focus of treatment was bed mobility, transfers,  Neuro-muscular reeducation, sitting balance, standing balance, attention, therapeutic activities, sustained attention, postural control.  Pt. Lying in bed asleep.  Provided multimodal cues for arousal.  Pt. Went from supine to EOB with max assist.  Engaged in bathing and dressing with +2 for standing balance to clean peri area and don clean pants.  Pt without functional task was mod assist with one task of standing balance.  She sat EOB and performed UB bathing and dressing with cues for correct management of LUE and balance.   Pt. Returned to supine at end of session due to drowsiness and left will all needs in reach and bed alarm on.     Therapy Documentation Precautions:  Precautions Precautions: Fall Precaution Comments: Severe L inattention Restrictions Weight Bearing Restrictions: No       Pain:  4/10 for 1st and 2nd session in neck and upper trunk         Other Treatments:   Time:  1500-1545   (45 min) Pain:  none Individual  session 2nd session:  Pt. Taken to gym.  Did work on mat with emphasis on sitting balance, sit to stand, standing balance, lateral weight shifts and weight bearing through LUE.  Daughter present during session.  Pt transferred to mat with mod assist.   OT provided therapeutic activities for balance, LUE,  Neck and Upper cervical region mobilization.  Pt. Tolerated session well and demonstrated better alignment and balance at end of session.  Pt. Returned to room and left at nursing station as daughter was leaving.    See FIM for current functional status  Therapy/Group: Individual Therapy  Humberto Sealsdwards, Hinata Diener J 02/05/2014, 7:42 PM

## 2014-02-05 NOTE — Progress Notes (Signed)
Physical Therapy Session Note  Patient Details  Name: Sarah Daniel MRN: [id number] Date of Birth: 1922/02/20  Today's Date: 02/05/2014 PT Individual Time: 1410-1500 PT Individual Time Calculation (min): 50 min   Short Term Goals: Week 1:  PT Short Term Goal 1 (Week 1): Pt to perform bed mobility with mod Ax1person PT Short Term Goal 2 (Week 1): Pt to perform bed<>w/c transfers with min A, 50% of time PT Short Term Goal 3 (Week 1): Pt to propel wheelchair 100' with mod Ax1person PT Short Term Goal 4 (Week 1): Pt to ambulate 6950' with mod Ax1person PT Short Term Goal 5 (Week 1): Pt to track head/eyes to midline during functional tasks with mod multimodal cues, 50% of time  Skilled Therapeutic Interventions/Progress Updates:    1:1. Pt received semi reclined in bed; asleep but able to awaken with increased time. Daughter, Sarah Daniel, present for majority of this session. Pt with decreased arousal, less verbalization during this session as compared with previous sessions. RN notified. Session focused on functional transfers and ambulation.   Transported pt to gym in w/c with total A. Attempted gait with R hallway hand rail; however, pt unable to initiate LLE advancement due to RUE pushing trunk to L side. Therefore, transitioned to gait x80' in controlled environment with bilat HHA, daughter assisting with management of IV pole. Pt performed >50% of gait trial and demonstrated consistent advancement of LLE without assist/cueing.  Per daughters questions, provided education about L neglect, ways to increase pt visual attention to L of midline, and importance of staying up in w/c between therapies. Daughter verbalized understanding of all education. Session ended in pt room, where pt was left reclined in w/c with half lap tray on, quick release belt in place for safety, soft call bell within reach, and daughter present. Daughter verbally agreed to notify nursing staff prior to leaving hospital.   Therapy  Documentation  Precautions:  Precautions Precautions: Fall Precaution Comments: Severe L inattention Restrictions Weight Bearing Restrictions: No Pain: Pain Assessment Pain Assessment: Faces Faces Pain Scale: No hurt Locomotion : Ambulation Ambulation/Gait Assistance: 1: +2 Total assist;3: Mod assist   See FIM for current functional status  Therapy/Group: Individual Therapy  Rozann Holts, Lorenda IshiharaBlair A 02/05/2014, 3:13 PM

## 2014-02-05 NOTE — Progress Notes (Signed)
Patient ID: Sarah Daniel, female   DOB: May 21, 1921, 80 y.o.   MRN: [id number]  Patient ID: Sarah Daniel, female   DOB: May 21, 1921, 80 y.o.   MRN: [id number]   02/05/14.  80 y.o. right handed female with history of dementia maintained on Aricept, coronary artery disease, hypertension. Presented 01/29/2014 after being found down by her daughter with left-sided weakness and facial droop.  MRI/MRA of the brain shows acute nonhemorrhagic anterior right MCA territory infarct as well as occlusion of the right internal carotid artery with partial reconstitution of the cavernous and ophthalmic segments. CT angiogram of the neck again showing right internal carotid artery occlusion as well as proximal left subclavian artery occlusion less than 10 mm from the aortic arch. Patient did not receive TPA. Echocardiogram with ejection fraction of 60% grade 1 diastolic dysfunction. EEG with mild diffuse slowing of the waking background without seizure activity. Neurology service  consulted maintained on aspirin   Subjective/Complaints:  Very fatigued after OT; c/o some L shoulder pain  Review of Systems - cannot obtain secondary to poor awareness  Past Medical History  Diagnosis Date  . Dementia   . Hypertension   . GERD (gastroesophageal reflux disease)   . Thyroid disease   . PUD (peptic ulcer disease)   . Coronary artery disease   . Hypothyroidism   . Arthritis      Intake/Output Summary (Last 24 hours) at 02/05/14 0911 Last data filed at 02/05/14 0654  Gross per 24 hour  Intake   1820 ml  Output      0 ml  Net   1820 ml    Patient Vitals for the past 24 hrs:  BP Temp Temp src Pulse Resp SpO2  02/05/14 0650 148/78 mmHg 98 F (36.7 C) Oral 88 16 98 %  02/04/14 1431 119/62 mmHg 98.7 F (37.1 C) Oral 98 17 97 %      Objective: Vital Signs: Blood pressure 148/78, pulse 88, temperature 98 F (36.7 C), temperature source Oral, resp. rate 16, weight 41.5 kg (91 lb 7.9 oz), SpO2 98.00%. No  results found. No results found for this or any previous visit (from the past 72 hour(s)).   Gen- frail HEENT: Left facial droop Cardio: RRR and no murmur Resp: CTA B/L and unlabored GI: BS positive and NT, ND; NGT Extremity:  No Edema Skin:   Bruise LUE and LLE; IVF  Neuro: Confused, Flat, Cranial Nerve Abnormalities Left central VII, Abnormal Sensory Left hemisensory def, Abnormal Motor 2- biceps, shoulder add, 2- L HFMusc/Skel:  Other no pain with A/PROM of UE and LE NAD,R gaze pref   Assessment/Plan: 1. 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  DVT Prophylaxis/Anticoagulation: Subcutaneous Lovenox. Monitor platelet counts and any signs of bleeding 3. Pain Management: Ultram as needed. 4. Mood/depression/dementia. Aricept 10 mg daily at bedtime, Effexor 37.5 mg daily. Discussed difficulty with evaluation of depression in setting of large CVA     5. Dysphagia. Nasogastric tube feeds. Follow-up speech therapy. IV fluids to maintain hydration             -panda will need to be secured, decision for PEG in ~1wk  LOS (Days) 4 A FACE TO FACE EVALUATION WAS PERFORMED  Rogelia BogaKWIATKOWSKI,PETER FRANK 02/05/2014, 9:04 AM

## 2014-02-05 NOTE — Plan of Care (Signed)
Problem: RH BLADDER ELIMINATION Goal: RH STG MANAGE BLADDER WITH ASSISTANCE STG Manage Bladder With max Assistance  Outcome: Not Progressing Total assist

## 2014-02-06 ENCOUNTER — Inpatient Hospital Stay (HOSPITAL_COMMUNITY): Payer: Medicare Other | Admitting: *Deleted

## 2014-02-06 ENCOUNTER — Inpatient Hospital Stay (HOSPITAL_COMMUNITY): Payer: Medicare Other

## 2014-02-06 DIAGNOSIS — Z4659 Encounter for fitting and adjustment of other gastrointestinal appliance and device: Secondary | ICD-10-CM | POA: Insufficient documentation

## 2014-02-06 NOTE — Plan of Care (Signed)
Problem: RH BLADDER ELIMINATION Goal: RH STG MANAGE BLADDER WITH ASSISTANCE STG Manage Bladder With max Assistance  Outcome: Progressing     

## 2014-02-06 NOTE — Plan of Care (Signed)
Problem: RH BOWEL ELIMINATION Goal: RH STG MANAGE BOWEL WITH ASSISTANCE STG Manage Bowel with max Assistance.  Outcome: Progressing     

## 2014-02-06 NOTE — Plan of Care (Signed)
Problem: RH BOWEL ELIMINATION Goal: RH STG MANAGE BOWEL W/MEDICATION W/ASSISTANCE STG Manage Bowel with Medication with mod.Assistance.  Outcome: Progressing     

## 2014-02-06 NOTE — Progress Notes (Signed)
Patient ID: Sarah Daniel, female   DOB: December 26, 1921, 80 y.o.   MRN: [id number]  Patient ID: Sarah Daniel, female   DOB: December 26, 1921, 80 y.o.   MRN: [id number]  Patient ID: Sarah Daniel, female   DOB: December 26, 1921, 80 y.o.   MRN: [id number]   02/06/14.  80 y.o. right handed female with history of dementia maintained on Aricept, coronary artery disease, hypertension. Presented 01/29/2014 after being found down by her daughter with left-sided weakness and facial droop.  MRI/MRA of the brain shows acute nonhemorrhagic anterior right MCA territory infarct as well as occlusion of the right internal carotid artery with partial reconstitution of the cavernous and ophthalmic segments. CT angiogram of the neck again showing right internal carotid artery occlusion as well as proximal left subclavian artery occlusion less than 10 mm from the aortic arch. Patient did not receive TPA. Echocardiogram with ejection fraction of 60% grade 1 diastolic dysfunction. EEG with mild diffuse slowing of the waking background without seizure activity. Neurology service  consulted maintained on aspirin   Subjective/Complaints:  Pulled NGT out during the night in spite of  L HP and mitten R hand.  Review of Systems - cannot obtain secondary to poor awareness  Past Medical History  Diagnosis Date  . Dementia   . Hypertension   . GERD (gastroesophageal reflux disease)   . Thyroid disease   . PUD (peptic ulcer disease)   . Coronary artery disease   . Hypothyroidism   . Arthritis     No intake or output data in the 24 hours ending 02/06/14 [id number]  Patient Vitals for the past 24 hrs:  BP Temp Temp src Pulse Resp SpO2  02/06/14 0502 (!) 143/80 mmHg 98.1 F (36.7 C) Oral 80 16 96 %      Objective: Vital Signs: Blood pressure 143/80, pulse 80, temperature 98.1 F (36.7 C), temperature source Oral, resp. rate 16, weight 43.3 kg (95 lb 7.4 oz), SpO2 96 %. No results found. No results found for this or any previous visit  (from the past 72 hour(s)).   Gen- frail somnolent this am HEENT: Left facial droop Cardio: RRR and no murmur Resp: CTA B/L and unlabored GI: BS positive and NT, ND; NGT Extremity:  No Edema Skin:   Bruise LUE and LLE; IVF  Neuro: somnolent,  Cranial Nerve Abnormalities Left central VII, Abnormal Sensory Left hemisensory def, Abnormal Motor 2- biceps, shoulder add, 2- L HFMusc/Skel:  Other no pain with A/PROM of UE and LE NAD,R gaze pref   Assessment/Plan: 1. 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  DVT Prophylaxis/Anticoagulation: Subcutaneous Lovenox. Monitor platelet counts and any signs of bleeding 3. Pain Management: Ultram as needed. 4. Mood/depression/dementia. Aricept 10 mg daily at bedtime, Effexor 37.5 mg daily. Discussed difficulty with evaluation of depression in setting of large CVA     5. Dysphagia. Nasogastric tube feeds. Follow-up speech therapy. IV fluids to maintain hydration             -panda will need to be secured, decision for PEG in ~1wk  LOS (Days) 5 A FACE TO FACE EVALUATION WAS PERFORMED  Rogelia BogaKWIATKOWSKI,PETER FRANK 02/06/2014, 8:22 AM

## 2014-02-06 NOTE — Progress Notes (Signed)
Occupational Therapy Session Note  Patient Details  Name: Sarah Daniel MRN: [id number] Date of Birth: January 12, 1922  Today's Date: 02/06/2014 OT Individual Time:  -   0900-1000  (60 min)      Short Term Goals: Week 1:  OT Short Term Goal 1 (Week 1): Pt will perform grooming with min A at sink in order to increase self care. OT Short Term Goal 2 (Week 1): Pt will perform bathing with Mod A in order to increase I with self care. OT Short Term Goal 3 (Week 1): Pt will perform UB dressing with Mod A  utilizing modifcations as needed in order to I self care. OT Short Term Goal 4 (Week 1): Pt will perform clothing management with assistance for balance during toileting tasks.  OT Short Term Goal 5 (Week 1): Pt will locate items to the L during ADL tasks with min verbal cues during bathing and dressing session.   Skilled Therapeutic Interventions/Progress Updates:    Pt supine in bed.  Pt. Incontinent of urine.  Addressed bed mobility, sitting balance, transfers, standing balance.  Performed rolling for linen and clothes changing.  Pt. Performed rolling with max assist with cues to use right hand to hold rail.  She followed one step directions 90 % of time.  Was more alert during session.  Sat EOB and bathed UB with mod assist.  She transferred to wc with mod assist.  Placed at sink.  Pt combed hair with minimal assist apraxia with holding comb correctly.  Pt taken to nursing station at end of session for safety.    Therapy Documentation Precautions:  Precautions Precautions: Fall Precaution Comments: Severe L inattention Restrictions Weight Bearing Restrictions: No     Pain:  5/10 chest and back     See FIM for current functional status  Therapy/Group: Individual Therapy  Humberto Sealsdwards, Luian Schumpert J 02/06/2014, 6:39 PM

## 2014-02-06 NOTE — Plan of Care (Signed)
Problem: RH BOWEL ELIMINATION Goal: RH STG MANAGE BOWEL W/EQUIPMENT W/ASSISTANCE STG Manage Bowel With Equipment With mod Assistance  Outcome: Progressing     

## 2014-02-07 ENCOUNTER — Inpatient Hospital Stay (HOSPITAL_COMMUNITY): Payer: Medicare Other | Admitting: Physical Therapy

## 2014-02-07 ENCOUNTER — Inpatient Hospital Stay (HOSPITAL_COMMUNITY): Payer: Medicare Other | Admitting: Speech Pathology

## 2014-02-07 ENCOUNTER — Inpatient Hospital Stay (HOSPITAL_COMMUNITY): Payer: Medicare Other | Admitting: Occupational Therapy

## 2014-02-07 DIAGNOSIS — I63411 Cerebral infarction due to embolism of right middle cerebral artery: Secondary | ICD-10-CM

## 2014-02-07 DIAGNOSIS — G8110 Spastic hemiplegia affecting unspecified side: Secondary | ICD-10-CM | POA: Diagnosis present

## 2014-02-07 NOTE — Plan of Care (Signed)
Problem: RH SKIN INTEGRITY Goal: RH STG SKIN FREE OF INFECTION/BREAKDOWN Max Assist Outcome: Progressing     

## 2014-02-07 NOTE — Progress Notes (Addendum)
Speech Language Pathology Daily Session Note  Patient Details  Name: Nataki [place] MRN: [id number] Date of Birth: 11/30/1921  Today's Date: 02/07/2014 SLP Individual Time: [id number] SLP Individual Time Calculation (min): 60 min  Short Term Goals: Week 1: SLP Short Term Goal 1 (Week 1): Pt will demonstrate an increased swallow initiation with Max A multimodal cues to utilize swallowing compensatory strategies. SLP Short Term Goal 2 (Week 1): Pt will demonstrated increased sustained attention with Max A multimodal cues. SLP Short Term Goal 3 (Week 1): Pt will utilize external memory aids to increase orientation with Max A multimodal cues. SLP Short Term Goal 4 (Week 1): Pt will demonstrate functional problem solving for basic and familiar tasks with Max A multimodal cues.  SLP Short Term Goal 5 (Week 1): Pt will use call bell to request assistance with Max A multimodal cues. SLP Short Term Goal 6 (Week 1): Pt will utilize over articulation and increased vocal intensity to increase speech intelligibility with Max A multimodal cues.   Skilled Therapeutic Interventions: Treatment focused on cognitive and swallowing recovery. Patient with some improvements in alertness since previous session. Able to sustain attention to functional ADL (self feeding) with moderate cues, demonstrate basic problem solving with moderate cues for control of impulsivity and decreased awareness of lines/tubes/leads. Oral phase remains moderately delayed with po trials with suspected premature spillage of bolus and delayed swallow initiation as well as suspected pharyngeal residuals post swallow, all of which result in s/s of decreased airway protection across consistencies. Wet vocal quality clearing intermittently with maximum verbal and demonstration cueing for hard throat clear and dry swallow. Max verbal and visual cueing provided for attention to left visual field and for orientation to situation today. Recommend continuing  current POC with po trials via SLP at bedside. Suspect that as cognitive status improves, we will see some concomitant improvements in swallowing function however suspect there will be residual  neurogenic dysphagia of unknown severity that resides. Discussed plan with daughter including potential considerations of long term non-oral means of nutrition (including basic risks involved) vs comfort feeds if does not make significant progress. Will continue to f/u.    FIM:  Comprehension Comprehension Mode: Auditory Comprehension: 2-Understands basic 25 - 49% of the time/requires cueing 51 - 75% of the time Expression Expression Mode: Verbal Expression: 1-Expresses basis less than 25% of the time/requires cueing greater than 75% of the time. Social Interaction Social Interaction: 1-Interacts appropriately less than 25% of the time. May be withdrawn or combative. Problem Solving Problem Solving: 1-Solves basic less than 25% of the time - needs direction nearly all the time or does not effectively solve problems and may need a restraint for safety Memory Memory: 1-Recognizes or recalls less than 25% of the time/requires cueing greater than 75% of the time  Pain Pain Assessment Pain Assessment: No/denies pain Faces Pain Scale: Hurts little more Pain Location: Head Pain Intervention(s): Medication (See eMAR);Repositioned  Therapy/Group: Individual Therapy   Ferdinand LangoLeah Ardis Lawley MA, CCC-SLP [phone]   Ferdinand LangoMcCoy Aaliya Maultsby Meryl 02/07/2014, 2:37 PM

## 2014-02-07 NOTE — Progress Notes (Signed)
Patient ID: Sarah Daniel, female   DOB: 1922-02-18, 80 y.o.   MRN: [id number]  80 y.o. right handed female with history of dementia maintained on Aricept, coronary artery disease, hypertension. By report patient lives with her daughter independent prior to admission and assistance as needed. Presented 01/29/2014 after being found down by her daughter with left-sided weakness and facial droop. Patient was a poor medical historian. MRI/MRA of the brain shows acute nonhemorrhagic anterior right MCA territory infarct as well as occlusion of the right internal carotid artery with partial reconstitution of the cavernous and ophthalmic segments. CT angiogram of the neck again showing right internal carotid artery occlusion as well as proximal left subclavian artery occlusion less than 10 mm from the aortic arch. Patient did not receive TPA. Echocardiogram with ejection fraction of 60% grade 1 diastolic dysfunction. EEG with mild diffuse slowing of the waking background without seizure activity. Neurology service  consulted maintained on aspirin   Subjective/Complaints: Cont NPO, no cough or gagging on secretions Severe L neglect Review of Systems - cannot obtain secondary to poor awareness     Objective: Vital Signs: Blood pressure 156/89, pulse 92, temperature 98.5 F (36.9 C), temperature source Axillary, resp. rate 17, weight 40.9 kg (90 lb 2.7 oz), SpO2 98 %. Dg [name] G Tube Plc W/fl W/rad  02/06/2014   CLINICAL DATA:  Patient will nasogastric tube appear  EXAM: NASO G TUBE PLACEMENT WITH FL AND WITH RAD  TECHNIQUE: Under fluoroscopic guidance a feeding tube was placed with its tip placed distal to the pylorus in the transverse portion the duodenum.  FLUOROSCOPY TIME:  1 min 18 seconds.  COMPARISON:  02/02/2014.  FINDINGS: Feeding tube successfully placed to the level of the transverse duodenum. There were no complications.  IMPRESSION: Successful fluoroscopic directed feeding tube placement.    Electronically Signed   By: Maisie Fushomas  Register   On: 02/06/2014 15:23   No results found for this or any previous visit (from the past 72 hour(s)).   HEENT: Left facial droop Cardio: RRR and no murmur Resp: CTA B/L and unlabored GI: BS positive and NT, ND Extremity:  No Edema Skin:   Bruise LUE and LLE Neuro: Confused, Flat, Cranial Nerve Abnormalities Left central VII, Abnormal Sensory Left hemisensory def, Abnormal Motor 2- biceps, shoulder add, 2- L HF, KE, synergy pattern  0/5 Left ankle, Dysarthric and Inattention Musc/Skel:  Other no pain with A/PROM of UE and LE NAD, severe R gaze pref   Assessment/Plan: 1. 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 Physiatrist is providing close team supervision and 24 hour management of active medical problems listed below. Physiatrist and rehab team continue to assess barriers to discharge/monitor patient progress toward functional and medical goals. FIM: FIM - Bathing Bathing Steps Patient Completed: Chest, Right upper leg, Left Arm, Abdomen Bathing: 2: Max-Patient completes 3-4 [id number] 10 parts or 25-49%  FIM - Upper Body Dressing/Undressing Upper body dressing/undressing steps patient completed: Thread/unthread right sleeve of front closure shirt/dress Upper body dressing/undressing: 1: Total-Patient completed less than 25% of tasks FIM - Lower Body Dressing/Undressing Lower body dressing/undressing: 1: Total-Patient completed less than 25% of tasks  FIM - Toileting Toileting: 1: Total-Patient completed zero steps, helper did all 3  FIM - ArchivistToilet Transfers Toilet Transfers: 0-Activity did not occur  FIM - BankerBed/Chair Transfer Bed/Chair Transfer Assistive Devices: Bed rails, HOB elevated, Arm rests Bed/Chair Transfer: 2:  Supine > Sit: Max A (lifting assist/Pt. 25-49%), 3: Bed > Chair or W/C: Mod A  (lift or lower assist), 3: Chair or W/C > Bed: Mod A (lift or lower assist)  FIM - Locomotion: Wheelchair Distance: 75', difficulty incorporating use of R LE, but attempting to consistently propel with R UE Locomotion: Wheelchair: 1: Total Assistance/staff pushes wheelchair (Pt<25%) FIM - Locomotion: Ambulation Locomotion: Ambulation Assistive Devices: Other (comment) (bilat HHA) Ambulation/Gait Assistance: 1: +2 Total assist, 3: Mod assist Locomotion: Ambulation: 1: Two helpers  Comprehension Comprehension Mode: Auditory Comprehension: 2-Understands basic 25 - 49% of the time/requires cueing 51 - 75% of the time  Expression Expression Mode: Verbal Expression: 1-Expresses basis less than 25% of the time/requires cueing greater than 75% of the time.  Social Interaction Social Interaction: 1-Interacts appropriately less than 25% of the time. May be withdrawn or combative.  Problem Solving Problem Solving: 1-Solves basic less than 25% of the time - needs direction nearly all the time or does not effectively solve problems and may need a restraint for safety  Memory Memory: 1-Recognizes or recalls less than 25% of the time/requires cueing greater than 75% of the time  Medical Problem List and Plan: 1. Functional deficits secondary to right MCA infarct with dense left hemiparesis and visual-spatial deficits 2.  DVT Prophylaxis/Anticoagulation: Subcutaneous Lovenox. Monitor platelet counts and any signs of bleeding 3. Pain Management: Ultram as needed. 4. Mood/depression/dementia. Aricept 10 mg daily at bedtime, Effexor 75 mg daily. Discussed baseline cognition with family 5. Neuropsych: This patient is not capable of making decisions on her own behalf. 6. Skin/Wound Care: Routine skin checks as well as routine turning to maintain skin integrity 7. Fluids/Electrolytes/Nutrition: Follow-up chemistries. Strict I and O's.Hx CKD, monitor renal status 8. Dysphagia. Severe NPO Nasogastric tube  feeds. Follow-up speech therapy. IV fluids to maintain hydration             -panda has been replaced several times, will be NPO for weeks or months, would rec PEG, Pt cannot consent 9. Hypothyroidism. Synthroid. Latest TSH level 2.929 10. GERD. Protonix 11. Hx CAD, ASA   12.  Mild hypokalemia LOS (Days) 6 A FACE TO FACE EVALUATION WAS PERFORMED  KIRSTEINS,ANDREW E 02/07/2014, 10:27 AM

## 2014-02-07 NOTE — Progress Notes (Signed)
Occupational Therapy Session Note  Patient Details  Name: Naraya [place] MRN: [id number] Date of Birth: [birth year]  Today's Date: 02/07/2014 OT Individual Time: 4098-11910730-0830 OT Individual Time Calculation (min): 60 min    Short Term Goals: Week 1:  OT Short Term Goal 1 (Week 1): Pt will perform grooming with min A at sink in order to increase self care. OT Short Term Goal 2 (Week 1): Pt will perform bathing with Mod A in order to increase I with self care. OT Short Term Goal 3 (Week 1): Pt will perform UB dressing with Mod A  utilizing modifcations as needed in order to I self care. OT Short Term Goal 4 (Week 1): Pt will perform clothing management with assistance for balance during toileting tasks.  OT Short Term Goal 5 (Week 1): Pt will locate items to the L during ADL tasks with min verbal cues during bathing and dressing session.   Skilled Therapeutic Interventions/Progress Updates:    Engaged in self-care retraining with focus on bed mobility, transfers, Lt attention, and sitting balance.  Pt in bed upon arrival, easily aroused this session.  Engaged in simple visual tracking task to initiate head rotation to improve midline orientation, pt very resistant to turning head.  Completed LB bathing and dressing at bed level with pt demonstrating increased mobility in LLE to position in preparation for bridging and rolling as needed for clothing management and hygiene.  Hand over hand to reach for bed rail on Lt for rolling and tracking to Lt. Pt with increased participation and attention to task this session, however continues to require max verbal cues for visual tracking even to midline.  Stand pivot transfer bed > w/c to complete UB bathing seated at sink.  Pt continues to lean to Lt even in supported sitting, requiring positioning of LUE to increase midline orientation and use of mirror to promote scanning to midline.  Pt continues to demonstrate forward cervical and trunk flexion in sitting,  improved trunk with half lap tray on Lt to improve LUE positioning.    Therapy Documentation Precautions:  Precautions Precautions: Fall Precaution Comments: Severe L inattention Restrictions Weight Bearing Restrictions: No Pain:  Pt with no c/o pain  See FIM for current functional status  Therapy/Group: Individual Therapy  Rosalio LoudHOXIE, Anthon Harpole 02/07/2014, 10:48 AM

## 2014-02-07 NOTE — Progress Notes (Addendum)
Physical Therapy Session Note  Patient Details  Name: Sarah [place] MRN: [id number] Date of Birth: 1921-07-25  Today's Date: 02/07/2014 PT Individual Time: 0830-0930 PT Individual Time Calculation (min): 60 min   Short Term Goals: Week 1:  PT Short Term Goal 1 (Week 1): Pt to perform bed mobility with mod Ax1person PT Short Term Goal 2 (Week 1): Pt to perform bed<>w/c transfers with min A, 50% of time PT Short Term Goal 3 (Week 1): Pt to propel wheelchair 100' with mod Ax1person PT Short Term Goal 4 (Week 1): Pt to ambulate 3050' with mod Ax1person PT Short Term Goal 5 (Week 1): Pt to track head/eyes to midline during functional tasks with mod multimodal cues, 50% of time  Skilled Therapeutic Interventions/Progress Updates:    2:1. Pt received seated in w/c at nurses' station; agreeable to therapy. Session focused on increasing pt attention to L side of visual field and body, promoting upright/midline posture in with sitting, standing, and functional mobility. Transported pt to gym in w/c with total A. In gym, pt performed squat pivot transfer (to R side) with min A. See below for detailed description of NMR interventions.   Performed gait x84' in controlled environment with +2A, R HHA and additional therapist assisting at pt's L side; multimodal cueing focused on upright posture due to pt tendency toward significant cervicothoracic flexion. Pt consistently advanced LLE without hands-on assist during gait. Pt then ascended 3 stairs, descended 2 stairs forward-facing with R rail and step-to pattern and +2A, increased time, manual facilitation of lateral weight shifting, tactile cueing at L knee for weightbearing, and verbal/tactile cueing for initiation during descent. Noted presence of L pushing during gait, stair negotiation, and sitting with bilat LE support.  Of note, pt did respond to light touch in LLE (proximally > distally) when not testing on RLE simultaneously. Pt appears to incorporate LLE  more consistently with automatic tasks, such as functional ambulation. Pt left reclined in w/c at nurses' station with quick release belt in place for safety, half lap tray in place, mitt on R hand, and all needs within reach. Notified RN of pt frequently attempting to pull on NG tube during this session. RN to reassess NG tube position post-session.  Therapy Documentation Precautions:  Precautions Precautions: Fall Precaution Comments: Severe L inattention Restrictions Weight Bearing Restrictions: No Pain: Pain Assessment Pain Assessment: Faces Faces Pain Scale: No hurt Locomotion : Ambulation Ambulation/Gait Assistance: 1: +2 Total assist  NMR: Treatments Neuromuscular Facilitation: Activity to increase motor control;Activity to increase anterior-posterior weight shifting;Activity to increase lateral weight shifting;Activity to increase sustained activation;Other (comment) (Activity to increase postural control, attention to L visual field/hemi bdy) Weight Bearing Technique Weight Bearing Technique: Yes RUE Weight Bearing Technique: Forearm seated;Other (comment) (propping on single forearm) LUE Weight Bearing Technique: Forearm seated;Other (comment) (propping on single forearm)  Seated EOM, pt performed static and dynamic sitting, propping on single forearm (performed on bilat UE's) with mat height elevated to prevent pushing to L side. R forearm propping focused on R trunk elongation; L forearm propping performed to increase LUE activation and proprioception. Pt performed RUE reaching to promote upright posture, increase pt visual attention to L visual field. Therapist positioned behind pt to manually facilitate upright posture, functional weight shifting, and elongation/shortening of trunk. Max multimodal cueing provided for pt to visually attend to location of horseshoe and post. Pt with difficulty locating items at midline.  See FIM for current functional status  Therapy/Group:  Individual Therapy  Hobble, Lorenda IshiharaBlair A  02/07/2014, 12:11 PM

## 2014-02-08 ENCOUNTER — Inpatient Hospital Stay (HOSPITAL_COMMUNITY): Payer: Medicare Other

## 2014-02-08 ENCOUNTER — Inpatient Hospital Stay (HOSPITAL_COMMUNITY): Payer: Medicare Other | Admitting: Occupational Therapy

## 2014-02-08 ENCOUNTER — Inpatient Hospital Stay (HOSPITAL_COMMUNITY): Payer: Medicare Other | Admitting: *Deleted

## 2014-02-08 DIAGNOSIS — R1314 Dysphagia, pharyngoesophageal phase: Secondary | ICD-10-CM

## 2014-02-08 DIAGNOSIS — G8110 Spastic hemiplegia affecting unspecified side: Secondary | ICD-10-CM

## 2014-02-08 LAB — CREATININE, SERUM
Creatinine, Ser: 0.59 mg/dL (ref 0.50–1.10)
GFR calc Af Amer: >90 mL/min (ref >90)
GFR calc non Af Amer: 78 mL/min — ABNORMAL LOW (ref >90)

## 2014-02-08 MED ORDER — IOHEXOL 300 MG/ML  SOLN
50.0000 mL | Freq: Once | INTRAMUSCULAR | Status: AC | PRN
  Administered 2014-02-08: 20 mL

## 2014-02-08 NOTE — Progress Notes (Signed)
Speech Language Pathology Daily Session Note  Patient Details  Name: Sarah Daniel MRN: [id number] Date of Birth: 08-04-1921  Today's Date: 02/08/2014 SLP Individual Time: 1300-1400 SLP Individual Time Calculation (min): 60 min  Short Term Goals: Week 1: SLP Short Term Goal 1 (Week 1): Pt will demonstrate an increased swallow initiation with Max A multimodal cues to utilize swallowing compensatory strategies. SLP Short Term Goal 2 (Week 1): Pt will demonstrated increased sustained attention with Max A multimodal cues. SLP Short Term Goal 3 (Week 1): Pt will utilize external memory aids to increase orientation with Max A multimodal cues. SLP Short Term Goal 4 (Week 1): Pt will demonstrate functional problem solving for basic and familiar tasks with Max A multimodal cues.  SLP Short Term Goal 5 (Week 1): Pt will use call bell to request assistance with Max A multimodal cues. SLP Short Term Goal 6 (Week 1): Pt will utilize over articulation and increased vocal intensity to increase speech intelligibility with Max A multimodal cues.   Skilled Therapeutic Interventions: Skilled treatment focused on swallowing and cognitive goals. SLP facilitated session with Mod cues to utilize environmental cues to increase temporal orientation, and Max multimodal cues for orientation to situation. Patient independently verbalized that she needed to use the bathroom and was assisted wtih help from nursing staff to the bed to utilize the bedpan. Patient had already soiled her brief upon transferring, however followed one-step commands throughout transfer and bed mobility wtih Min-Mod cues. Pt did not scan to midline despite Max multimodal cueing. SLP provided set-up assist for patient to perform oral care via suctioning. She engaged in self-feeding PO trials overt signs of aspiration noted wtih all liquid consistencies. With Mod cues for sustained attention and initiation, patient consumed bites of pureed solids wtih  occasional, gentle throat clear. This throat clearing is present at baseline as well, making it difficult to adequately assess tolerance. Given overall improved alertness and engagement in PO trials, recommend to proceed with MBS in order to more objectively assess oropharyngeal swallow and aspiration risk.   FIM:  Comprehension Comprehension Mode: Auditory Comprehension: 2-Understands basic 25 - 49% of the time/requires cueing 51 - 75% of the time Expression Expression Mode: Verbal Expression: 2-Expresses basic 25 - 49% of the time/requires cueing 50 - 75% of the time. Uses single words/gestures. Social Interaction Social Interaction: 2-Interacts appropriately 25 - 49% of time - Needs frequent redirection. Problem Solving Problem Solving: 2-Solves basic 25 - 49% of the time - needs direction more than half the time to initiate, plan or complete simple activities Memory Memory: 1-Recognizes or recalls less than 25% of the time/requires cueing greater than 75% of the time FIM - Eating Eating Activity: 3: Helper scoops food on utensil every scoop (with PO trials)  Pain Pain Assessment Pain Assessment: No/denies pain Faces Pain Scale: Hurts even more Pain Location: Head Pain Intervention(s): Medication (See eMAR)  Therapy/Group: Individual Therapy   Maxcine HamLaura Paiewonsky, M.A. CCC-SLP [phone]  Maxcine Hamaiewonsky, Malichi Palardy 02/08/2014, 4:33 PM

## 2014-02-08 NOTE — Progress Notes (Signed)
Physical Therapy Session Note  Patient Details  Name: Sarah [place] MRN: [id number] Date of Birth: 01/22/1922  Today's Date: 02/08/2014 PT Individual Time: 0900-1002 PT Individual Time Calculation (min): 62 min   Short Term Goals: Week 1:  PT Short Term Goal 1 (Week 1): Pt to perform bed mobility with mod Ax1person PT Short Term Goal 2 (Week 1): Pt to perform bed<>w/c transfers with min A, 50% of time PT Short Term Goal 3 (Week 1): Pt to propel wheelchair 100' with mod Ax1person PT Short Term Goal 4 (Week 1): Pt to ambulate 6250' with mod Ax1person PT Short Term Goal 5 (Week 1): Pt to track head/eyes to midline during functional tasks with mod multimodal cues, 50% of time     Skilled Therapeutic Interventions/Progress Updates:  Tx focused on functional mobility training, sitting balance/trunk control, L-sided attention tasks, gait with +2 assist and NMR via forced use, manual facilitation, and verbal cues.  Pt resting in bed with daughter present initially and +2 assist available for safety. Unfortunately, pt pulled NG tube partially out during tx while mit was off for R UE manipulation and gait tasks. Nursing aware and removed entirely at end of tx.   Performed rolling in bed with Max A to L for increased L-sided weight bearing and proprioception. Continues transition to sit with Max A for LE management and trunk lifting.  Instructed pt in multiple stand-pivot transfers to pt's R: bed<>WC><mat<>WC>bed with Max A overall for midline orientation and safely completing turn. Pt tends to sit prematurely, but is able to use bil LEs for lifting to stand.   Pt propelled WC x75' with RUE only and Mod steering assist from therapist in controlled setting.   Performed supported and unsupported sitting tasks edge of mat, focusing on increasing upright trunk and upward head/gaze:  - forward/backwards scooting, lateral scooting - reaching placing tasks at far R and upward for increased trunk extension -  resting back on swiss ball with therapist sitting, to increase chest and trunk ext - RUE manipulation, focusing on reaching/placing pegs across midline - pt needed MAX multimodal cues for this task to locate and sustain gaze on target in midline.   Performed standing at high table to promote trunk ext with +2 for stability due to forward flexion, posterior and L lean. Peg task at high table.   Gait training with Give Mohr sling:  - +2 assist x40' with assist for balance on L side and cues for step length and weight shifting - +1 assist x60' with manual facilitation at pelvis for rotation, weight shifting, and increased cadence.  Pt very fatigued at end of each trial, LEs began melting into flexion.  Pt able to increase step length and cadence with facilitation, but needed increasing support as fatigue increased.   Pt transferred back to bed with max A and handoff to nursing.      Therapy Documentation Precautions:  Precautions Precautions: Fall Precaution Comments: Severe L inattention Restrictions Weight Bearing Restrictions: No   Pain: no reports      Locomotion : Ambulation Ambulation/Gait Assistance: 1: +2 Total assist Wheelchair Mobility Distance: 75   See FIM for current functional status  Therapy/Group: Individual Therapy  Clydene Lamingole Alanii Ramer, PT, DPT   02/08/2014, 12:34 PM

## 2014-02-08 NOTE — Progress Notes (Signed)
The patient's nurse spoke with radiology regarding nasogastric tube placement. Radiology reported that they have to get approval from Radiologist to place tube. They stated they have already placed three.   Radiologist granted approval and patient is scheduled for nasogastric tube today.   Will continue to monitor.   Edgardo RoysMcGrath, Laresa Oshiro R

## 2014-02-08 NOTE — Progress Notes (Signed)
Patient ID: Sarah Daniel, female   DOB: 09-27-1921, 80 y.o.   MRN: [id number]  80 y.o. right handed female with history of dementia maintained on Aricept, coronary artery disease, hypertension. By report patient lives with her daughter independent prior to admission and assistance as needed. Presented 01/29/2014 after being found down by her daughter with left-sided weakness and facial droop. Patient was a poor medical historian. MRI/MRA of the brain shows acute nonhemorrhagic anterior right MCA territory infarct as well as occlusion of the right internal carotid artery with partial reconstitution of the cavernous and ophthalmic segments. CT angiogram of the neck again showing right internal carotid artery occlusion as well as proximal left subclavian artery occlusion less than 10 mm from the aortic arch. Patient did not receive TPA. Echocardiogram with ejection fraction of 49% grade 1 diastolic dysfunction. EEG with mild diffuse slowing of the waking background without seizure activity. Neurology service  consulted maintained on aspirin   Subjective/Complaints: Cont NPO, no cough or gagging on secretions Discussed Feeding tube with daughter who has talked to sister, they recall that mom didn't want tube, pt is not competant to make decision Discussed palliative care consult Severe L neglect Review of Systems - cannot obtain secondary to poor awareness     Objective: Vital Signs: Blood pressure 153/65, pulse 90, temperature 98.4 F (36.9 C), temperature source Oral, resp. rate 18, weight 41.5 kg (91 lb 7.9 oz), SpO2 99 %. Dg Loyce Dys Tube Plc W/fl W/rad  02/06/2014   CLINICAL DATA:  Patient will nasogastric tube appear  EXAM: NASO G TUBE PLACEMENT WITH FL AND WITH RAD  TECHNIQUE: Under fluoroscopic guidance a feeding tube was placed with its tip placed distal to the pylorus in the transverse portion the duodenum.  FLUOROSCOPY TIME:  1 min 18 seconds.  COMPARISON:  02/02/2014.  FINDINGS: Feeding tube  successfully placed to the level of the transverse duodenum. There were no complications.  IMPRESSION: Successful fluoroscopic directed feeding tube placement.   Electronically Signed   By: Marcello Moores  Register   On: 02/06/2014 15:23   Results for orders placed or performed during the hospital encounter of 02/01/14 (from the past 72 hour(s))  Creatinine, serum     Status: Abnormal   Collection Time: 02/08/14  7:12 AM  Result Value Ref Range   Creatinine, Ser 0.59 0.50 - 1.10 mg/dL   GFR calc non Af Amer 78 (L) >90 mL/min   GFR calc Af Amer >90 >90 mL/min    Comment: (NOTE) The eGFR has been calculated using the CKD EPI equation. This calculation has not been validated in all clinical situations. eGFR's persistently <90 mL/min signify possible Chronic Kidney Disease.      HEENT: Left facial droop Cardio: RRR and no murmur Resp: CTA B/L and unlabored GI: BS positive and NT, ND Extremity:  No Edema Skin:   Bruise LUE and LLE Neuro: Confused, Flat, Cranial Nerve Abnormalities Left central VII, Abnormal Sensory Left hemisensory def, Abnormal Motor 2- biceps, shoulder add, 2- L HF, KE, synergy pattern  0/5 Left ankle, Dysarthric and Inattention Musc/Skel:  Other no pain with A/PROM of UE and LE NAD, severe R gaze pref   Assessment/Plan: 1. 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 Physiatrist is providing close team supervision and 24 hour management of active medical problems listed below. Physiatrist and rehab team continue to assess barriers to discharge/monitor patient progress  toward functional and medical goals. Will consult palliative but no withdrawal of TF until daughters finish travel plans (going to [place] for funeral) FIM: FIM - Bathing Bathing Steps Patient Completed: Chest, Abdomen, Right upper leg, Left upper leg Bathing: 2:  Max-Patient completes 3-4 25f10 parts or 25-49%  FIM - Upper Body Dressing/Undressing Upper body dressing/undressing steps patient completed: Thread/unthread right sleeve of front closure shirt/dress Upper body dressing/undressing: 1: Total-Patient completed less than 25% of tasks FIM - Lower Body Dressing/Undressing Lower body dressing/undressing: 1: Total-Patient completed less than 25% of tasks  FIM - Toileting Toileting: 0: No continent bowel/bladder events this shift  FIM - TAir cabin crewTransfers: 0-Activity did not occur  FIM - BControl and instrumentation engineerDevices: Arm rests Bed/Chair Transfer: 4: Chair or W/C > Bed: Min A (steadying Pt. > 75%)  FIM - Locomotion: Wheelchair Distance: 75', difficulty incorporating use of R LE, but attempting to consistently propel with R UE Locomotion: Wheelchair: 1: Total Assistance/staff pushes wheelchair (Pt<25%) FIM - Locomotion: Ambulation Locomotion: Ambulation Assistive Devices: Other (comment) (R HHA) Ambulation/Gait Assistance: 1: +2 Total assist Locomotion: Ambulation: 1: Two helpers  Comprehension Comprehension Mode: Auditory Comprehension: 2-Understands basic 25 - 49% of the time/requires cueing 51 - 75% of the time  Expression Expression Mode: Verbal Expression: 1-Expresses basis less than 25% of the time/requires cueing greater than 75% of the time.  Social Interaction Social Interaction: 1-Interacts appropriately less than 25% of the time. May be withdrawn or combative.  Problem Solving Problem Solving: 1-Solves basic less than 25% of the time - needs direction nearly all the time or does not effectively solve problems and may need a restraint for safety  Memory Memory: 1-Recognizes or recalls less than 25% of the time/requires cueing greater than 75% of the time  Medical Problem List and Plan: 1. Functional deficits secondary to right MCA infarct with dense left hemiparesis and  visual-spatial deficits 2.  DVT Prophylaxis/Anticoagulation: Subcutaneous Lovenox. Monitor platelet counts and any signs of bleeding 3. Pain Management: Ultram as needed. 4. Mood/depression/dementia. Aricept 10 mg daily at bedtime, Effexor 75 mg daily. Discussed baseline cognition with family 5. Neuropsych: This patient is not capable of making decisions on her own behalf. 6. Skin/Wound Care: Routine skin checks as well as routine turning to maintain skin integrity 7. Fluids/Electrolytes/Nutrition: Follow-up chemistries. Strict I and O's.Hx CKD, monitor renal status 8. Dysphagia. Severe NPO Nasogastric tube feeds. Follow-up speech therapy. IV fluids to maintain hydration  swallowing recovery prognosis is guarded, very long duration at best 9. Hypothyroidism. Synthroid. Latest TSH level 2.929 10. GERD. Protonix 11. Hx CAD, ASA   12.  Mild hypokalemia LOS (Days) 7 A FACE TO FACE EVALUATION WAS PERFORMED  , E 02/08/2014, 8:27 AM

## 2014-02-08 NOTE — Progress Notes (Signed)
Occupational Therapy Session Note  Patient Details  Name: Sarah Daniel MRN: [id number] Date of Birth: [birth year]  Today's Date: 02/08/2014 OT Individual Time: 1100-1200 OT Individual Time Calculation (min): 60 min   Short Term Goals: Week 1:  OT Short Term Goal 1 (Week 1): Pt will perform grooming with min A at sink in order to increase self care. OT Short Term Goal 2 (Week 1): Pt will perform bathing with Mod A in order to increase I with self care. OT Short Term Goal 3 (Week 1): Pt will perform UB dressing with Mod A  utilizing modifcations as needed in order to I self care. OT Short Term Goal 4 (Week 1): Pt will perform clothing management with assistance for balance during toileting tasks.  OT Short Term Goal 5 (Week 1): Pt will locate items to the L during ADL tasks with min verbal cues during bathing and dressing session.   Skilled Therapeutic Interventions/Progress Updates:  Patient resting in bed upon arrival.  Engaged in self care retraining to include sponge bath, dress and groom.  Focused session on visually attending to midline and include neck stretches secondary to significant right gaze preference, bed mobility during bathing peri area and buttocks and change brief.  Patient completed UB bath with HOB up and donned shirt and pants in long sitting.  Once seated EOB patient required min-total assist for sitting balance to complete LB dressing and max assist for standing to pull up pants.  Patient transferred to reclining w/c with stand step and max assist while traveling to her right.  Patient performed grooming while seated in w/c at sink.  Lap tray and QRB in place and patient place at nursing station per her request.  Therapy Documentation Precautions:  Precautions Precautions: Fall Precaution Comments: Severe L inattention Restrictions Weight Bearing Restrictions: No Pain: No indication of pain except with gentle ROM of neck due to significant right gaze  preference ADL: See FIM for current functional status  Therapy/Group: Individual Therapy  Sayeed Weatherall 02/08/2014, 4:22 PM

## 2014-02-08 NOTE — Progress Notes (Signed)
INITIAL NUTRITION ASSESSMENT  DOCUMENTATION CODES Per approved criteria  -Not Applicable   INTERVENTION: Once NGT placed and ready for use continue TF of Jevity 1.2 at goal rate of 60 ml/hr over 20 hours (in anticipation of therapy) to provide 1440 kcal, 67 grams of protein, and 972 ml of free water.  Continue 150 ml of free water 3 times daily. Total water daily: 1422 ml  TF regimen is providing 100% of estimated nutrition needs.  Will continue to monitor.  NUTRITION DIAGNOSIS: Inadequate oral intake related to inability to eat as evidenced by NPO status  Goal: Pt to meet >/= 90% of their estimated nutrition needs   Monitor:  TF tolerance, weight trends, labs, I/O's  80 y.o. female  Admitting Dx: CVA  ASSESSMENT: Pt with history of dementia, coronary artery disease, hypertension. By report patient lives with her daughter independent prior to admission and assistance as needed. Presented 01/29/2014 after being found down by her daughter with left-sided weakness and facial droop. MRI/MRA of the brain shows acute nonhemorrhagic anterior right MCA territory infarct as well as occlusion of the right internal carotid artery with partial reconstitution of the cavernous and ophthalmic segments.  NGT pulled out by pt this AM. Pt scheduled for nasogastric tube placement today. Pt has been tolerating her tube feedings. PEG placement is being discussed upon. Palliative care consulted.   Height: Ht Readings from Last 1 Encounters:  01/30/14 4\' 9"  (1.448 m)    Weight: Wt Readings from Last 1 Encounters:  02/08/14 91 lb 7.9 oz (41.5 kg)  02/02/14 87 lbs  BMI:  Body mass index is 19.79 kg/(m^2).  Re-Estimated Nutritional Needs: Kcal: 1300-1500 Protein: 55-65 grams  Fluid: >/=1.5 L/day  Skin: +1 LUE edema  Diet Order: Diet NPO time specified   No intake or output data in the 24 hours ending 02/08/14 1454  Last BM: 11/2  Labs:   Recent Labs Lab 02/01/14 2022  02/02/14 0726 02/08/14 0712  NA  --  137  --   K  --  3.3*  --   CL  --  99  --   CO2  --  19  --   BUN  --  13  --   CREATININE 0.62 0.67 0.59  CALCIUM  --  8.6  --   GLUCOSE  --  98  --     CBG (last 3)  No results for input(s): GLUCAP in the last 72 hours.  Scheduled Meds: . aspirin  300 mg Rectal Daily   Or  . aspirin  325 mg Oral Daily  . chlorhexidine  15 mL Mouth Rinse BID  . donepezil  10 mg Oral QHS  . enoxaparin (LOVENOX) injection  30 mg Subcutaneous Q24H  . free water  150 mL Per Tube TID  . levothyroxine  50 mcg Oral QAC breakfast  . pantoprazole (PROTONIX) IV  40 mg Intravenous Q24H  . venlafaxine  37.5 mg Per NG tube BID WC    Continuous Infusions: . sodium chloride Stopped (02/08/14 0914)  . feeding supplement (JEVITY 1.2 CAL) Stopped (02/08/14 0915)    Past Medical History  Diagnosis Date  . Dementia   . Hypertension   . GERD (gastroesophageal reflux disease)   . Thyroid disease   . PUD (peptic ulcer disease)   . Coronary artery disease   . Hypothyroidism   . Arthritis     Past Surgical History  Procedure Laterality Date  . Abdominal hysterectomy      Judeth CornfieldStephanie  La, MS, RD, LDN Pager # [phone] After hours/ weekend pager # [phone]

## 2014-02-08 NOTE — Progress Notes (Signed)
Physical therapy reported to the nurse the patient had grabbed her own NG tube and pulled it during therapy. Upon assessment the nurse found the tube to have been pulled from the appropriate place approximately six inches. The nurse then removed the remainder of the tube. Nares appeared reddened with new and old blood present. Patient stated relief from the discomfort of the tube.   Reported incident to Justice Deedsan Angiulla, PA    Will continue to monitor.   Edgardo RoysMcGrath, Onita Pfluger R

## 2014-02-08 NOTE — Plan of Care (Signed)
Problem: RH BOWEL ELIMINATION Goal: RH STG MANAGE BOWEL WITH ASSISTANCE STG Manage Bowel with max Assistance.  Outcome: Progressing Goal: RH STG MANAGE BOWEL W/MEDICATION W/ASSISTANCE STG Manage Bowel with Medication with mod Assistance.  Outcome: Progressing Goal: RH STG MANAGE BOWEL W/EQUIPMENT W/ASSISTANCE STG Manage Bowel With Equipment With mod Assistance  Outcome: Progressing  Problem: RH BLADDER ELIMINATION Goal: RH STG MANAGE BLADDER WITH ASSISTANCE STG Manage Bladder With max Assistance  Outcome: Progressing Goal: RH STG MANAGE BLADDER WITH MEDICATION WITH ASSISTANCE STG Manage Bladder With Medication With max Assistance.  Outcome: Progressing Goal: RH STG MANAGE BLADDER WITH EQUIPMENT WITH ASSISTANCE STG Manage Bladder With Equipment With min Assistance  Outcome: Progressing  Problem: RH SKIN INTEGRITY Goal: RH STG SKIN FREE OF INFECTION/BREAKDOWN Max Assist Outcome: Progressing Goal: RH STG MAINTAIN SKIN INTEGRITY WITH ASSISTANCE STG Maintain Skin Integrity With Max Assistance.  Outcome: Progressing  Problem: RH SAFETY Goal: RH STG ADHERE TO SAFETY PRECAUTIONS W/ASSISTANCE/DEVICE STG Adhere to Safety Precautions With Mod Assistance/Device.  Outcome: Progressing Goal: RH STG DECREASED RISK OF FALL WITH ASSISTANCE STG Decreased Risk of Fall With Fluor CorporationMod Assistance.  Outcome: Progressing  Problem: RH PAIN MANAGEMENT Goal: RH STG PAIN MANAGED AT OR BELOW PT'S PAIN GOAL <2  Outcome: Progressing

## 2014-02-09 ENCOUNTER — Inpatient Hospital Stay (HOSPITAL_COMMUNITY): Payer: Medicare Other | Admitting: Occupational Therapy

## 2014-02-09 ENCOUNTER — Inpatient Hospital Stay (HOSPITAL_COMMUNITY): Payer: Medicare Other

## 2014-02-09 ENCOUNTER — Encounter (HOSPITAL_COMMUNITY): Payer: Medicare Other | Admitting: Speech Pathology

## 2014-02-09 ENCOUNTER — Inpatient Hospital Stay (HOSPITAL_COMMUNITY): Payer: Medicare Other | Admitting: Physical Therapy

## 2014-02-09 DIAGNOSIS — I639 Cerebral infarction, unspecified: Secondary | ICD-10-CM

## 2014-02-09 DIAGNOSIS — G8110 Spastic hemiplegia affecting unspecified side: Secondary | ICD-10-CM

## 2014-02-09 DIAGNOSIS — Z515 Encounter for palliative care: Secondary | ICD-10-CM

## 2014-02-09 NOTE — Plan of Care (Signed)
Problem: RH BOWEL ELIMINATION Goal: RH STG MANAGE BOWEL WITH ASSISTANCE STG Manage Bowel with max Assistance.  Outcome: Progressing Goal: RH STG MANAGE BOWEL W/MEDICATION W/ASSISTANCE STG Manage Bowel with Medication with mod Assistance.  Outcome: Progressing Goal: RH STG MANAGE BOWEL W/EQUIPMENT W/ASSISTANCE STG Manage Bowel With Equipment With mod Assistance  Outcome: Progressing  Problem: RH BLADDER ELIMINATION Goal: RH STG MANAGE BLADDER WITH ASSISTANCE STG Manage Bladder With max Assistance  Outcome: Progressing Goal: RH STG MANAGE BLADDER WITH MEDICATION WITH ASSISTANCE STG Manage Bladder With Medication With max Assistance.  Outcome: Progressing Goal: RH STG MANAGE BLADDER WITH EQUIPMENT WITH ASSISTANCE STG Manage Bladder With Equipment With min Assistance  Outcome: Progressing  Problem: RH SKIN INTEGRITY Goal: RH STG SKIN FREE OF INFECTION/BREAKDOWN Max Assist Outcome: Progressing Goal: RH STG MAINTAIN SKIN INTEGRITY WITH ASSISTANCE STG Maintain Skin Integrity With Max Assistance.  Outcome: Progressing  Problem: RH SAFETY Goal: RH STG ADHERE TO SAFETY PRECAUTIONS W/ASSISTANCE/DEVICE STG Adhere to Safety Precautions With Mod Assistance/Device.  Outcome: Progressing Goal: RH STG DECREASED RISK OF FALL WITH ASSISTANCE STG Decreased Risk of Fall With Mod Assistance.  Outcome: Progressing  Problem: RH PAIN MANAGEMENT Goal: RH STG PAIN MANAGED AT OR BELOW PT'S PAIN GOAL <2  Outcome: Progressing     

## 2014-02-09 NOTE — Patient Care Conference (Signed)
Inpatient RehabilitationTeam Conference and Plan of Care Update Date: 02/09/2014   Time: 10;45 AM    Patient Name: Sarah Daniel      Medical Record Number: [id number]  Date of Birth: [birth year] Sex: Female         Room/Bed: 4W18C/4W18C-01 Payor Info: Payor: [employer] [place] MEDICARE / Plan: BLUE MEDICARE / Product Type: *No Product type* /    Admitting Diagnosis: R MCA CVA  SIG NEGLECT   Admit Date/Time:  02/01/2014  5:33 PM Admission Comments: No comment available   Primary Diagnosis:  <principal problem not specified> Principal Problem: <principal problem not specified>  Patient Active Problem List   Diagnosis Date Noted  . Spastic hemiplegia affecting nondominant side 02/07/2014  . Encounter for nasogastric (NG) tube placement   . Stroke 01/29/2014  . CVA (cerebral infarction) 01/29/2014  . Hypothyroidism 01/29/2014  . Hypotension 03/08/2013  . Nausea & vomiting 03/08/2013  . Weakness generalized 03/08/2013  . Anemia 03/08/2013  . CKD (chronic kidney disease), stage III 03/08/2013  . Dementia   . Hypertension   . Thyroid disease   . Coronary artery disease     Expected Discharge Date: Expected Discharge Date: 02/18/14  Team Members Present: Physician leading conference: Dr. Claudette LawsAndrew Kirsteins Social Worker Present: Dossie DerBecky Amarylis Rovito, LCSW Nurse Present: Carmie EndAngie Joyce, RN PT Present: Edman CircleAudra Hall, PT;Caroline Adriana [place], PT;Blair Hobble, PT OT Present: Rosalio LoudSarah Hoxie, OT SLP Present: Jackalyn LombardNicole Page, SLP PPS Coordinator present : Edson SnowballBecky Windsor, PT     Current Status/Progress Goal Weekly Team Focus  Medical   severe neglect, poor cognition  involve palliative  pleasure feeds   Bowel/Bladder   incont. of bowel and bladder  Manage bowel/bladder with brief with total assist  attempt timed toileting   Swallow/Nutrition/ Hydration   NPO   mod A with least restrictive diet   family education related to palliative care consult    ADL's   mod-max assist transfers and bed  mobility, max assist self-care tasks, +2 for self-care tasks in unsupported sitting  min assist overall  attention to Lt, cognitive remediation, self-care retraining, static and dynamic sitting balance   Mobility   Mod-Max A transfers and bed mobility; Total A to +2A (for safety) with gait  Supervision at w/c level, Min A at limited ambulatory level  Attention to L visual field/hemi body, cognitive remediation, bed mobility, transfers, postural/gait stability, continue family education   Communication   mod-max assist for intelligibility   mod assist   increase vocal intensity    Safety/Cognition/ Behavioral Observations  max-total assist for basic familiar tasks   mod assist   increase sustained attention, orientation, alertness, arousal.     Pain   FACES scale. Pt complains of headaches- tylenol 650mg  prn q4hrs  </=3  asses pain qshift, monitor non verbal signs and medicate as needed    Skin   Brusising to BUE and abdomen.   No new skin breakdown with total assist from staff  turn pt q 2 hrs, tilt chair q30 mins      *See Care Plan and progress notes for long and short-term goals.  Barriers to Discharge: see above    Possible Resolutions to Barriers:  see above    Discharge Planning/Teaching Needs:    Family discussing plans and meeting with palliative care team.  Local daughter flying out of town for a funeral and will be back Sunday.  Pt's other [place]     Team Discussion:  Palliative care consult-meeting with family.  NG reinserted for now. MBS-discussing diet with daughter and pt's wishes.  Advanced Directive on chart. More alert in therapies.  Daughter's to decide on a plan once together.  Revisions to Treatment Plan:  Possibly NH versus Hospice Home   Continued Need for Acute Rehabilitation Level of Care: The patient requires daily medical management by a physician with specialized training in physical medicine and rehabilitation for the  following conditions: Daily direction of a multidisciplinary physical rehabilitation program to ensure safe treatment while eliciting the highest outcome that is of practical value to the patient.: Yes Daily medical management of patient stability for increased activity during participation in an intensive rehabilitation regime.: Yes Daily analysis of laboratory values and/or radiology reports with any subsequent need for medication adjustment of medical intervention for : Neurological problems;Other  Sarah Daniel, Sarah Daniel 02/09/2014, 2:36 PM

## 2014-02-09 NOTE — Consult Note (Signed)
Patient SV:[id number] Sarah Daniel      DOB: 1921/08/09      QZE:[id number]     Consult Note from the Palliative Medicine Team at [facility] Requested by: Dr. Letta Pate    PCP: Irven Shelling, MD Reason for Consultation: [place], hospice options  Phone Number:[phone]  Assessment of patients Current state: I met today with Ms. Funk's daughter, Karena Addison. She is leaving for [facility] Thurs-Sunday for a funeral. Dee's daughter, Milbert Coulter can be reached [phone] and will be able to contact [place] or Dee's sister, Cordella Register. Karena Addison has decided on no PEG. She also tells me that if Ms. Sarah Daniel pulls out her NGT again then we should NOT replace it. At this point we will proceed with comfort feeds: I would not encourage Ms. Sarah Daniel to eat but give her foods/drink when she asks or desires. I do not believe encouraging foods will be comfort as she is likely to aspirate. Karena Addison is hopeful that we can maintain until she can return. Karena Addison is hopeful that her sister Stanton Kidney will be able to join [place] to continue discussing hospice options and comfort path. I will continue to follow and hope to meet together with family again on Monday.    Goals of Care: 1.  Code Status: DNR   2. Scope of Treatment: Continue medical treatment with plan for transition to comfort care when family can be here together.    4. Disposition: To be determined. Possibly hospice facility.    3. Symptom Management:   1. Pain: Acetaminophen prn mild pain. Tramadol every 6 hours prn.  2. Bowel Regimen: Senokot-S qhs prn mild constipation. Sorbitol daily prn moderate constipation.  3. Sleep: Trazodone qhs prn.  4. Nausea/Vomiting: Ondansetron prn.  5. Dysphagia: No PEG. Continue NGT and tube feeding temporarily. Transition to comfort feeds early next week. 6. Neck pain/lower back pain: Offered Lidoderm patch but Karena Addison says Ms. Sarah Daniel did not like this in the past. Continue using muscle rub cream.   4. Psychosocial: Emotional support  provided to patient and daughter.    Patient Documents Completed or Given: Document Given Completed  Advanced Directives Pkt    MOST    DNR    Gone from My [facility]    Hard Choices yes     Brief HPI: 80 yo female with right MCA infarct and left hemiparesis with visual/spatial deficits. Not progressing well in rehab with continued recommendation for NPO with high risk of aspiration and PEG not desired. PMH of dementia, hypertension, GERD, hypothyroidism, peptic ulcer disease, CAD, arthritis   ROS: + dysphagia, + weakness, + lower back pain, + neck pain    PMH:  Past Medical History  Diagnosis Date  . Dementia   . Hypertension   . GERD (gastroesophageal reflux disease)   . Thyroid disease   . PUD (peptic ulcer disease)   . Coronary artery disease   . Hypothyroidism   . Arthritis      PSH: Past Surgical History  Procedure Laterality Date  . Abdominal hysterectomy     I have reviewed the FH and SH and  If appropriate update it with new information. No Known Allergies Scheduled Meds: . aspirin  300 mg Rectal Daily   Or  . aspirin  325 mg Oral Daily  . chlorhexidine  15 mL Mouth Rinse BID  . donepezil  10 mg Oral QHS  . enoxaparin (LOVENOX) injection  30 mg Subcutaneous Q24H  . free water  150 mL Per Tube  TID  . levothyroxine  50 mcg Oral QAC breakfast  . pantoprazole (PROTONIX) IV  40 mg Intravenous Q24H  . venlafaxine  37.5 mg Per NG tube BID WC   Continuous Infusions: . sodium chloride 60 mL/hr at 02/09/14 1455  . feeding supplement (JEVITY 1.2 CAL) 1,000 mL (02/09/14 1455)   PRN Meds:.acetaminophen, MUSCLE RUB, ondansetron **OR** ondansetron (ZOFRAN) IV, senna-docusate, sorbitol, traMADol, traZODone    BP 200/77 mmHg  Pulse 71  Temp(Src) 98 F (36.7 C) (Oral)  Resp 18  Wt 43.908 kg (96 lb 12.8 oz)  SpO2 98%   PPS: 20%   Intake/Output Summary (Last 24 hours) at 02/09/14 1819 Last data filed at 02/09/14 1454  Gross per 24 hour  Intake  10942 ml   Output      0 ml  Net  10942 ml   LBM: 02/09/14  Physical Exam:  General: NAD, elderly, thin, frail HEENT: [place]/AT, no JVD, moist mucous membranes Chest: CTA throughout, no labored breathing, symmetric CVS: RRR, S1 S2 Abdomen: Soft, NT, ND, +BS Ext: No edema, warm to touch Neuro: Lethargic, soft voice, oriented to person  Labs: CBC    Component Value Date/Time   WBC 8.0 02/02/2014 0726   RBC 3.67* 02/02/2014 0726   HGB 10.7* 02/02/2014 0726   HCT 33.4* 02/02/2014 0726   PLT  02/02/2014 0726    PLATELET CLUMPS NOTED ON SMEAR, COUNT APPEARS ADEQUATE   MCV 91.0 02/02/2014 0726   MCH 29.2 02/02/2014 0726   MCHC 32.0 02/02/2014 0726   RDW 15.1 02/02/2014 0726   LYMPHSABS 0.9 02/02/2014 0726   MONOABS 0.7 02/02/2014 0726   EOSABS 0.0 02/02/2014 0726   BASOSABS 0.0 02/02/2014 0726    BMET    Component Value Date/Time   NA 137 02/02/2014 0726   K 3.3* 02/02/2014 0726   CL 99 02/02/2014 0726   CO2 19 02/02/2014 0726   GLUCOSE 98 02/02/2014 0726   BUN 13 02/02/2014 0726   CREATININE 0.59 02/08/2014 0712   CALCIUM 8.6 02/02/2014 0726   GFRNONAA 78* 02/08/2014 0712   GFRAA >90 02/08/2014 0712    CMP     Component Value Date/Time   NA 137 02/02/2014 0726   K 3.3* 02/02/2014 0726   CL 99 02/02/2014 0726   CO2 19 02/02/2014 0726   GLUCOSE 98 02/02/2014 0726   BUN 13 02/02/2014 0726   CREATININE 0.59 02/08/2014 0712   CALCIUM 8.6 02/02/2014 0726   PROT 6.6 02/02/2014 0726   ALBUMIN 3.2* 02/02/2014 0726   AST 31 02/02/2014 0726   ALT 25 02/02/2014 0726   ALKPHOS 78 02/02/2014 0726   BILITOT 0.5 02/02/2014 0726   GFRNONAA 78* 02/08/2014 0712   GFRAA >90 02/08/2014 0712     Time In Time Out Total Time Spent with Patient Total Overall Time  1030 1150 79mn 820m    Greater than 50%  of this time was spent counseling and coordinating care related to the above assessment and plan.  AlVinie SillNP Palliative Medicine Team Pager # [phone]-F  8a-5p) Team Phone # [phone]/Weekends)

## 2014-02-09 NOTE — Progress Notes (Addendum)
Physical Therapy Weekly Progress Note  Patient Details  Name: Sarah Daniel MRN: [id number] Date of Birth: February 21, 1922  Beginning of progress report period: February 01, 2014 End of progress report period: February 09, 2014  Today's Date: 02/09/2014 PT Individual Time: 1120-1207 PT Individual Time Calculation (min): 47 min   Patient has met 0 of 5 short term goals, as pt has not consistently performed at goal level (especially when performing bed mobility and functional transfers to L side). Pt is progressing toward all STG's. Pt progress has been limited by decreased sustained attention, limited attention to L visual field and L side of body, impaired memory. During current session, pt did exhibit improved arousal, sustained attention, orientation, and ability to visually track to midline. Pt currently required min-mod A for bed mobility and transfers to R side and mod-max A for bed mobility and transfers to L side.  Patient continues to demonstrate the following deficits: muscle weakness, decreased cardiorespiratoy endurance, impaired timing and sequencing, abnormal tone, unbalanced muscle activation and decreased coordination, decreased visual acuity and decreased visual perceptual skills, decreased midline orientation, decreased attention to left and left side neglect, decreased initiation, decreased attention, decreased awareness, decreased problem solving, decreased safety awareness, decreased memory and delayed processing and decreased sitting balance, decreased standing balance, decreased postural control, hemiplegia and decreased balance strategies and therefore will continue to benefit from skilled PT intervention to enhance overall performance with activity tolerance, balance, postural control, ability to compensate for deficits, functional use of  left upper extremity and left lower extremity, attention, awareness, coordination and knowledge of precautions.  Patient progressing toward long  term goals. Addendum: Plan of care revisions: downgraded to mod A overall.  PT Short Term Goals Week 1:  PT Short Term Goal 1 (Week 1): Pt to perform bed mobility with mod Ax1person PT Short Term Goal 1 - Progress (Week 1): Progressing toward goal PT Short Term Goal 2 (Week 1): Pt to perform bed<>w/c transfers with min A, 50% of time PT Short Term Goal 2 - Progress (Week 1): Progressing toward goal PT Short Term Goal 3 (Week 1): Pt to propel wheelchair 100' with mod Ax1person PT Short Term Goal 3 - Progress (Week 1): Progressing toward goal PT Short Term Goal 4 (Week 1): Pt to ambulate 63' with mod Ax1person PT Short Term Goal 4 - Progress (Week 1): Progressing toward goal PT Short Term Goal 5 (Week 1): Pt to track head/eyes to midline during functional tasks with mod multimodal cues, 50% of time PT Short Term Goal 5 - Progress (Week 1): Progressing toward goal Week 2:  PT Short Term Goal 1 (Week 2): Pt will perform supine<>sit with mod A with HOB elevated 30 degrees using bed rail. PT Short Term Goal 2 (Week 2): Pt will perform bed<>w/c transfers with min A, 50% of the time. PT Short Term Goal 3 (Week 2): Pt will perform w/c mobility x100' with mod A and 50% cueing for attention to L visual field. PT Short Term Goal 4 (Week 2): Pt will ambulate x100' with mod A of one person (and w/c follow for safety, if needed). PT Short Term Goal 5 (Week 2): Pt will track head/eyes to midline to midline with mod cueing, 50% of the time.  Skilled Therapeutic Interventions/Progress Updates:    2:1. Pt received semi reclined in bed; agreeable to therapy. Pt oriented to self and place during this session. Daughter present for latter 50% of session. Pt performed supine > sit (to L side) with max  A, max cueing to initiate RUE movement across midline and onto bed rail, manual facilitation of LLE advancement toward EOB, and tactile cueing at L ribcage for L lateral flexion of trunk. Performed lateral scooting  transfer from bed > w/c with max A. Transported pt to gym in w/c with total A for energy conservation. See below for detailed description of NMR interventions. Pt performed gait x98' in controlled environment with R HHA, therapist positioned at L side supporting LUE and providing manual facilitation at L axilla, R ribcage for ; tactile cueing required. Overall, pt with increased sustained attention, improved visual scanning to midline during this session as compared with previous sessions. Departed with pt reclined in w/c with quick release belt in place and R mitt on for safety, daughter present, and all needs within reach. RN notified of pt location.   Therapy Documentation Precautions:  Precautions Precautions: Fall Precaution Comments: Severe L inattention Restrictions Weight Bearing Restrictions: No Pain: Pain Assessment Pain Assessment: Faces Faces Pain Scale: No hurt Locomotion : Wheelchair Mobility Distance: 20  NMR: Neuromuscular Facilitation: Activity to increase motor control;Activity to increase anterior-posterior weight shifting;Activity to increase lateral weight shifting;Activity to increase sustained activation Seated EOM with LUE support and bilat LE support, therapist sitting on physioball positioned behind pt to promote erect trunk flexion. Pt performed RUE anterior reaching for colored rings. Pt cued to name color of rings prior to reaching for rings to promote upright posture and visual attention to midline. Pt able to consistently visually scan to midline with min-mod cueing. During final trial, pt able to visually locate and reach for ring positioned just L of midline.   See FIM for current functional status  Therapy/Group: Individual Therapy  [place], Malva Cogan 02/09/2014, 4:40 PM

## 2014-02-09 NOTE — Progress Notes (Signed)
Occupational Therapy Weekly Progress Note  Patient Details  Name: Sarah Daniel MRN: [id number] Date of Birth: 06/16/1921  Beginning of progress report period: February 02, 2014 End of progress report period: February 09, 2014  Today's Date: 02/09/2014 OT Individual Time: 0263-7858 and 1340-1425 OT Individual Time Calculation (min): 60 min and 45 min   Patient has met 1 of 5 short term goals.  Pt is making very slow progress towards goals.  Her progress is impaired due to Rt gaze preference and Lt inattention/neglect as well as decreased attention to task.  Pt requires max assist with sitting balance due to severe trunk and cervical flexion in sitting and benefits from +2 to complete functional tasks.  Patient continues to demonstrate the following deficits: Lt hemiparesis, Lt neglect, decreased sitting and standing balance, impaired attention, problem solving, decreased memory  and therefore will continue to benefit from skilled OT intervention to enhance overall performance with BADL and Reduce care partner burden.  Patient not progressing toward long term goals.  See goal revision..  Plan of care revisions: downgraded to mod assist overall.  OT Short Term Goals Week 1:  OT Short Term Goal 1 (Week 1): Pt will perform grooming with min A at sink in order to increase self care. OT Short Term Goal 1 - Progress (Week 1): Met OT Short Term Goal 2 (Week 1): Pt will perform bathing with Mod A in order to increase I with self care. OT Short Term Goal 2 - Progress (Week 1): Met OT Short Term Goal 3 (Week 1): Pt will perform UB dressing with Mod A  utilizing modifcations as needed in order to I self care. OT Short Term Goal 3 - Progress (Week 1): Progressing toward goal OT Short Term Goal 4 (Week 1): Pt will perform clothing management with assistance for balance during toileting tasks.  OT Short Term Goal 4 - Progress (Week 1): Not met OT Short Term Goal 5 (Week 1): Pt will locate items to the L  during ADL tasks with min verbal cues during bathing and dressing session.  OT Short Term Goal 5 - Progress (Week 1): Progressing toward goal Week 2:  OT Short Term Goal 1 (Week 2): Pt will perform grooming with min A at sink in order to increase participation with self care. OT Short Term Goal 2 (Week 2): Pt will perform UB dressing with Mod A  utilizing modifcations as needed in order to increase participation with self care. OT Short Term Goal 3 (Week 2): Pt will locate items to the Lt during ADL tasks with min verbal cues during bathing and dressing session.  OT Short Term Goal 4 (Week 2): Pt will complete toilet transfer with mod assist OT Short Term Goal 5 (Week 2): Pt will complete LB dressing with max assist of 1 caregiver   Skilled Therapeutic Interventions/Progress Updates:    1) Engaged in ADL retraining with focus on increased participation in self-care tasks of bathing and dressing, transfers, and dynamic sitting balance at EOB.  Pt in bed upon arrival, awake but with decreased verbalizations.  Engaged in [place] bathing and dressing at bed level with focus on bed mobility and use of BLE to assist with bridging for perineal hygiene and LB dressing.  Mod verbal cues and hand over hand assist to utilize bed rails to assist in rolling for perineal hygiene.  Mod assist supine to sit with guiding at trunk and positioning of BLE.  UB bathing and dressing completed seated at EOB with +  2 assist with therapist providing manual facilitation to promote upright sitting balance and encourage scanning to midline to locate bathing items while 2nd person assisted with cues, positioning, and setup of items.  Pt continues to demonstrate increased cervical and trunk flexion in sitting.  Stand pivot transfer to Rt with mod assist and 2nd person for safety.  Seated at mirror for visual cues for midline orientation while brushing hair.   2) Engaged in therapeutic activity with focus on midline sitting balance and head  positioning.  Mod-max assist squat/scoot transfer to therapy mat.  Manual facilitation at trunk, shoulders, and head to promote midline sitting with focus on midline alignment of head and attention to midline.  Utilized 2nd person seated in front of pt to promote upright head and trunk, with pt able to initiate to locate person but unable to maintain position.  Engaged in scanning task with peg board placed at midline encouraging pt to place pegs in pegboard, required repositioning of pegboard to ~20 degrees right of midline with pt still only able to place pegs in far right holes.  Pt with increased verbalizations this session with answering simple questions about her family.  Pt returned to w/c with squat pivot transfer mod assist.  Therapy Documentation Precautions:  Precautions Precautions: Fall Precaution Comments: Severe L inattention Restrictions Weight Bearing Restrictions: No Pain: Pain Assessment Pain Score:  (pt off unit at this time) Faces Pain Scale: Hurts even more Pain Type: Acute pain Pain Location: Head Pain Descriptors / Indicators: Aching Pain Intervention(s): Medication (See eMAR)  See FIM for current functional status  Therapy/Group: Individual Therapy  Arynn Armand, [place] 02/09/2014, 10:14 AM

## 2014-02-09 NOTE — Progress Notes (Signed)
Social Work Patient ID: Sarah Daniel, female   DOB: 1921-08-25, 80 y.o.   MRN: [id number] Met with daughter-Dee to discuss team conference and palliative care consult.  She has already met with [facility]-Palliative [facility] She feels at this time they are doing the right thing and what pt-mom would want.  Pt's advanced directive in the chart. She is flying out tomorrow and returning Sunday night. Her sister will be back from [place] today and is planning to fly here next week, unsure of the day. At the time being pt has her NG tube.  Daughter wants to talk with sister and Palliative care team on Monday.  Will continue to work with pt and family on discharge plans.

## 2014-02-09 NOTE — Procedures (Signed)
Objective Swallowing Evaluation: Modified Barium Swallowing Study  Patient Details  Name: Sarah Daniel MRN: [id number] Date of Birth: 11-17-1921  Today's Date: 02/09/2014 Time: [id number]   Past Medical History:  Past Medical History  Diagnosis Date  . Dementia   . Hypertension   . GERD (gastroesophageal reflux disease)   . Thyroid disease   . PUD (peptic ulcer disease)   . Coronary artery disease   . Hypothyroidism   . Arthritis    Past Surgical History:  Past Surgical History  Procedure Laterality Date  . Abdominal hysterectomy     HPI:    Sarah Daniel is a 80 y.o. right handed female with history of dementia maintained on Aricept, coronary artery disease, hypertension. By report patient lives with her daughter, independent prior to admission with assistance as needed. Presented 01/29/2014 after being found down by her daughter with left-sided weakness and facial droop. MRI/MRA of the brain shows acute non-hemorrhagic anterior right MCA territory infarct. Swallow evaluation completed per speech therapy 01/30/2014 with recommendation for NPO with nasogastric tube for nutritional support.  MBS ordered today to objectively determine readiness for diet initiation.       Recommendation/Prognosis  Clinical Impression:   Dysphagia Diagnosis: Severe pharyngeal phase dysphagia;Severe oral phase dysphagia  Pt presents with a severe oropharyngeal dysphagia with sensory, motor, and cognitive components.  Oral phase impairments are characterized by significant left labial, lingual, and buccal weakness which resulted in weak pumping mechanism to slowly transit materials to the oropharynx.  Pt was also noted with decreased sustained attention which, in combination with the oral motor deficits listed above, resulted in oral holding (lasting ~1-2 minutes on average) with pt requiring max cuing to posteriorly transit materials.  The abovementioned deficits, in combination with decreased pharyngeal  sensation resulted in premature spillage of materials with pharyngeal swallow response triggered at the level of the vallecula.  While pt's pharyngeal strength was grossly within functional limits, pt's delayed swallow initiation resulted in decreased airway protection with intermittent silent penetration and aspiration of nectar thick and thin liquids.  No aspiration or penetration visualized with limited trials of honey thick liquids via teaspoon; however, SLP suspects that, given pt's oral phase impairments, swallow delay, and cognitive deficits, pt would eventually penetrate or aspirate honey thick liquids over time as well.  Pt is not only at risk for aspirating PO but is also likely aspirating her own secretions given persistently wet vocal quality noted at bedside and poor initiation for use of throat clear to more adequately manage secretions.  Recommend a palliative care consult to establish pt's and family's wishes going forward given that pt's recovery will likely be slow and may likely involve end of life issues.  Pt would continue to benefit from skilled ST while inpatient for ongoing family education related to comfort feeds versus PEG versus initiation of PO diet depending on family's wishes.  Pt may also benefit from small amounts of pureed consistencies per her request for comfort until family's wishes have been more clearly established.     Swallow Evaluation Recommendations:  Diet Recommendations: NPO Medication Administration: Via alternative means Oral Care Recommendations: Oral care Q4 per protocol Follow up Recommendations: Home health SLP;24 hour supervision/assistance;Skilled Nursing facility;Other (comment) (palliative care )    Prognosis:  Prognosis for Safe Diet Advancement: Guarded Barriers to Reach Goals: Cognitive deficits;Time post onset;Severity of dysphagia   Individuals Consulted: Consulted and Agree with Results and Recommendations: Patient unable/family or caregiver  not available  SLP Assessment/Plan  Plan:   See care plan    Short Term Goals: Week 1: SLP Short Term Goal 1 (Week 1): Pt will demonstrate an increased swallow initiation with Max A multimodal cues to utilize swallowing compensatory strategies. SLP Short Term Goal 2 (Week 1): Pt will demonstrated increased sustained attention with Max A multimodal cues. SLP Short Term Goal 3 (Week 1): Pt will utilize external memory aids to increase orientation with Max A multimodal cues. SLP Short Term Goal 4 (Week 1): Pt will demonstrate functional problem solving for basic and familiar tasks with Max A multimodal cues.  SLP Short Term Goal 5 (Week 1): Pt will use call bell to request assistance with Max A multimodal cues. SLP Short Term Goal 6 (Week 1): Pt will utilize over articulation and increased vocal intensity to increase speech intelligibility with Max A multimodal cues.     General: Date of Onset: 01/29/14 Type of Study: Modified Barium Swallowing Study Reason for Referral: Objectively evaluate swallowing function Previous Swallow Assessment: BSE 10/28 Diet Prior to this Study: NPO Temperature Spikes Noted: No Respiratory Status: Room air History of Recent Intubation: No Behavior/Cognition: Lethargic;Requires cueing;Decreased sustained attention Oral Cavity - Dentition: Dentures, top Oral Motor / Sensory Function: Impaired - see Bedside swallow eval Self-Feeding Abilities: Total assist Patient Positioning: Upright in chair Baseline Vocal Quality: Wet;Low vocal intensity Volitional Cough: Weak Anatomy: Within functional limits Pharyngeal Secretions: Not observed secondary MBS   Reason for Referral:   Objectively evaluate swallowing function    Oral Phase: Oral Preparation/Oral Phase Oral Phase: Impaired Oral - Honey Oral - Honey Teaspoon: Reduced posterior propulsion;Delayed oral transit;Weak lingual manipulation;Lingual pumping;Lingual/palatal residue;Holding of bolus Oral -  Nectar Oral - Nectar Teaspoon: Weak lingual manipulation;Reduced posterior propulsion;Delayed oral transit;Lingual/palatal residue;Lingual pumping;Holding of bolus Oral - Nectar Cup: Weak lingual manipulation;Reduced posterior propulsion;Delayed oral transit;Lingual/palatal residue;Holding of bolus;Lingual pumping Oral - Thin Oral - Thin Cup: Weak lingual manipulation;Reduced posterior propulsion;Delayed oral transit;Lingual/palatal residue;Lingual pumping;Holding of bolus Oral - Solids Oral - Puree: Weak lingual manipulation;Reduced posterior propulsion;Delayed oral transit;Lingual/palatal residue;Lingual pumping;Holding of bolus   Pharyngeal Phase:  Pharyngeal Phase Pharyngeal Phase: Impaired Pharyngeal - Honey Pharyngeal - Honey Teaspoon: Premature spillage to valleculae;Delayed swallow initiation Pharyngeal - Nectar Pharyngeal - Nectar Teaspoon: Delayed swallow initiation;Premature spillage to valleculae Pharyngeal - Nectar Cup: Delayed swallow initiation;Premature spillage to valleculae;Penetration/Aspiration during swallow Penetration/Aspiration details (nectar cup): Material enters airway, passes BELOW cords without attempt by patient to eject out (silent aspiration);Material enters airway, remains ABOVE vocal cords then ejected out;Material enters airway, CONTACTS cords and not ejected out Pharyngeal - Thin Pharyngeal - Thin Cup: Delayed swallow initiation;Premature spillage to valleculae;Penetration/Aspiration during swallow Penetration/Aspiration details (thin cup): Material enters airway, passes BELOW cords without attempt by patient to eject out (silent aspiration);Material enters airway, remains ABOVE vocal cords then ejected out;Material enters airway, CONTACTS cords and not ejected out Pharyngeal - Solids Pharyngeal - Puree: Delayed swallow initiation;Premature spillage to valleculae      GN          Daissy Yerian, Melanee SpryNicole L 02/09/2014, 4:09 PM

## 2014-02-09 NOTE — Progress Notes (Signed)
Patient ID: Sarah Daniel, female   DOB: 14-Dec-1921, 80 y.o.   MRN: [id number]  80 y.o. right handed female with history of dementia maintained on Aricept, coronary artery disease, hypertension. By report patient lives with her daughter independent prior to admission and assistance as needed. Presented 01/29/2014 after being found down by her daughter with left-sided weakness and facial droop. Patient was a poor medical historian. MRI/MRA of the brain shows acute nonhemorrhagic anterior right MCA territory infarct as well as occlusion of the right internal carotid artery with partial reconstitution of the cavernous and ophthalmic segments. CT angiogram of the neck again showing right internal carotid artery occlusion as well as proximal left subclavian artery occlusion less than 10 mm from the aortic arch. Patient did not receive TPA. Echocardiogram with ejection fraction of 46% grade 1 diastolic dysfunction. EEG with mild diffuse slowing of the waking background without seizure activity. Neurology service  consulted maintained on aspirin   Subjective/Complaints: Cont NPO, no cough or gagging on secretions Severe L neglect Per speech is ok for comfort feed using pureed textures Review of Systems - cannot obtain secondary to poor awareness     Objective: Vital Signs: Blood pressure 159/88, pulse 86, temperature 98 F (36.7 C), temperature source Oral, resp. rate 18, weight 43.908 kg (96 lb 12.8 oz), SpO2 98 %. Dg Abd 1 View  02/08/2014   CLINICAL DATA:  80 year old female - feeding tube placement.  EXAM: ABDOMEN - 1 VIEW  COMPARISON:  Prior studies  FINDINGS: A small bore feeding tube is identified on spot films. The tip size at the duodenal-jejunal junction, confirmed by contrast administration.  IMPRESSION: Small bore feeding tube with tip at the duodenum-jejunal junction.   Electronically Signed   By: Hassan Rowan M.D.   On: 02/08/2014 15:58   Ct Head Wo Contrast  02/09/2014   CLINICAL DATA:  Initial  evaluation for tremors.  EXAM: CT HEAD WITHOUT CONTRAST  TECHNIQUE: Contiguous axial images were obtained from the base of the skull through the vertex without intravenous contrast.  COMPARISON:  Prior MRI from 01/29/2014.  FINDINGS: Scattered hypodensity within the right frontal lobe again seen, compatible with recently identified right MCA territory infarct. Encephalomalacia more posteriorly within the right parietotemporal region is unchanged, compatible with remote infarct. No new large vessel territory infarct identified. No intracranial hemorrhage.  Advanced cerebral atrophy with chronic microvascular ischemic disease is stable.  No midline shift or mass effect. Ventricles are stable in size without evidence of hydrocephalus. No extra-axial fluid collection.  Scalp soft tissues within normal limits. No acute abnormality seen about the orbits.  Calvarium intact.  Mild to moderate mucoperiosteal thickening present within the right sphenoid sinus. A nasogastric tube is partially visualized within the right nasal cavity. No mastoid effusion.  IMPRESSION: 1. Continued interval evolution of right MCA territory infarct. No hemorrhagic conversion or significant mass effect. 2. Remote right temporoparietal infarct, stable. 3. No new intracranial process identified. 4. Advanced cerebral atrophy with chronic microvascular ischemic disease, stable from prior.   Electronically Signed   By: Jeannine Boga M.D.   On: 02/09/2014 02:32   Dg Loyce Dys Tube Plc W/fl-no Rad  02/08/2014   CLINICAL DATA:    NASO G TUBE PLACEMENT WITH FLUORO  Fluoroscopy was utilized by the requesting physician.  No radiographic  interpretation.    Results for orders placed or performed during the hospital encounter of 02/01/14 (from the past 72 hour(s))  Creatinine, serum     Status: Abnormal  Collection Time: 02/08/14  7:12 AM  Result Value Ref Range   Creatinine, Ser 0.59 0.50 - 1.10 mg/dL   GFR calc non Af Amer 78 (L) >90 mL/min    GFR calc Af Amer >90 >90 mL/min    Comment: (NOTE) The eGFR has been calculated using the CKD EPI equation. This calculation has not been validated in all clinical situations. eGFR's persistently <90 mL/min signify possible Chronic Kidney Disease.      HEENT: Left facial droop Cardio: RRR and no murmur Resp: CTA B/L and unlabored GI: BS positive and NT, ND Extremity:  No Edema Skin:   Bruise LUE and LLE Neuro: Confused, Flat, Cranial Nerve Abnormalities Left central VII, Abnormal Sensory Left hemisensory def, Abnormal Motor 2- biceps, shoulder add, 2- L HF, KE, synergy pattern  0/5 Left ankle, Dysarthric and Inattention Musc/Skel:  Other no pain with A/PROM of UE and LE NAD, severe R gaze pref   Assessment/Plan: 1. 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 Physiatrist is providing close team supervision and 24 hour management of active medical problems listed below. Physiatrist and rehab team continue to assess barriers to discharge/monitor patient progress toward functional and medical goals. Will consult palliative but no withdrawal of TF until daughters finish travel plans (going to [facility] for [facility]) Team conference today please see physician documentation under team conference tab, met with team face-to-face to discuss problems,progress, and goals. Formulized individual treatment plan based on medical history, underlying problem and comorbidities. FIM: FIM - Bathing Bathing Steps Patient Completed: Chest, Abdomen, Front perineal area, Right upper leg, Left upper leg, Right lower leg (including foot), Left lower leg (including foot) Bathing: 3: Mod-Patient completes 5-7 [id number] parts or 50-74% (at bed level)  FIM - Upper Body Dressing/Undressing Upper body dressing/undressing steps patient completed: Put head through opening of pull over  shirt/dress Upper body dressing/undressing: 2: Max-Patient completed 25-49% of tasks FIM - Lower Body Dressing/Undressing Lower body dressing/undressing steps patient completed: Thread/unthread right pants leg Lower body dressing/undressing: 1: Total-Patient completed less than 25% of tasks  FIM - Toileting Toileting: 0: No continent bowel/bladder events this shift  FIM - TAir cabin crewTransfers: 0-Activity did not occur  FIM - BControl and instrumentation engineerDevices: Bed rails, Arm rests Bed/Chair Transfer: 2: Bed > Chair or W/C: Max A (lift and lower assist), 2: Supine > Sit: Max A (lifting assist/Pt. 25-49%), 3: Chair or W/C > Bed: Mod A (lift or lower assist)  FIM - Locomotion: Wheelchair Distance: 20 Locomotion: Wheelchair: 1: Travels less than 50 ft with moderate assistance (Pt: 50 - 74%) FIM - Locomotion: Ambulation Locomotion: Ambulation Assistive Devices: Other (comment) (R HHA and therapist at L side) Ambulation/Gait Assistance: 1: +2 Total assist Locomotion: Ambulation: 1: Two helpers  Comprehension Comprehension Mode: Auditory Comprehension: 2-Understands basic 25 - 49% of the time/requires cueing 51 - 75% of the time  Expression Expression Mode: Verbal Expression: 2-Expresses basic 25 - 49% of the time/requires cueing 50 - 75% of the time. Uses single words/gestures.  Social Interaction Social Interaction: 1-Interacts appropriately less than 25% of the time. May be withdrawn or combative.  Problem Solving Problem Solving: 2-Solves basic 25 - 49% of the time - needs direction more than half the time to initiate, plan or complete simple activities  Memory Memory: 2-Recognizes or recalls 25 - 49% of the time/requires cueing 51 - 75% of  the time  Medical Problem List and Plan: 1. Functional deficits secondary to right MCA infarct with dense left hemiparesis and visual-spatial deficits 2.  DVT Prophylaxis/Anticoagulation: Subcutaneous  Lovenox. Monitor platelet counts and any signs of bleeding 3. Pain Management: Ultram as needed. 4. Mood/depression/dementia. Aricept 10 mg daily at bedtime, Effexor 75 mg daily. Discussed baseline cognition with family 5. Neuropsych: This patient is not capable of making decisions on her own behalf. 6. Skin/Wound Care: Routine skin checks as well as routine turning to maintain skin integrity 7. Fluids/Electrolytes/Nutrition: Follow-up chemistries. Strict I and O's.Hx CKD, monitor renal status 8. Dysphagia. Severe NPO Nasogastric tube feeds. Follow-up speech therapy. IV fluids to maintain hydration  swallowing recovery prognosis is guarded, very long duration at best 9. Hypothyroidism. Synthroid. Latest TSH level 2.929 10. GERD. Protonix 11. Hx CAD, ASA   12.  Mild hypokalemia LOS (Days) 8 A FACE TO FACE EVALUATION WAS PERFORMED  Danaly Bari E 02/09/2014, 12:27 PM

## 2014-02-09 NOTE — Plan of Care (Signed)
Problem: RH Eating Goal: LTG Patient will perform eating w/assist, cues/equip (OT) LTG: Patient will perform eating with assist, with/without cues using equipment (OT)  Outcome: Not Applicable Date Met:  42/87/68 D/C due to change in plan  Problem: RH Tub/Shower Transfers Goal: LTG Patient will perform tub/shower transfers w/assist (OT) LTG: Patient will perform tub/shower transfers with assist, with/without cues using equipment (OT)  Outcome: Not Applicable Date Met:  11/57/26 D/C due to change in plan

## 2014-02-10 ENCOUNTER — Inpatient Hospital Stay (HOSPITAL_COMMUNITY): Payer: Medicare Other | Admitting: Physical Therapy

## 2014-02-10 ENCOUNTER — Inpatient Hospital Stay (HOSPITAL_COMMUNITY): Payer: Medicare Other | Admitting: Occupational Therapy

## 2014-02-10 ENCOUNTER — Inpatient Hospital Stay (HOSPITAL_COMMUNITY): Payer: Medicare Other | Admitting: Speech Pathology

## 2014-02-10 NOTE — Plan of Care (Signed)
Problem: RH BLADDER ELIMINATION Goal: RH STG MANAGE BLADDER WITH ASSISTANCE STG Manage Bladder With max Assistance  Outcome: Progressing     

## 2014-02-10 NOTE — Progress Notes (Signed)
Occupational Therapy Session Note  Patient Details  Name: Sarah Daniel MRN: [id number] Date of Birth: 12-Oct-1921  Today's Date: 02/10/2014 OT Individual Time: [id number] OT Individual Time Calculation (min): 60 min    Short Term Goals: Week 2:  OT Short Term Goal 1 (Week 2): Pt will perform grooming with min A at sink in order to increase participation with self care. OT Short Term Goal 2 (Week 2): Pt will perform UB dressing with Mod A  utilizing modifcations as needed in order to increase participation with self care. OT Short Term Goal 3 (Week 2): Pt will locate items to the Lt during ADL tasks with min verbal cues during bathing and dressing session.  OT Short Term Goal 4 (Week 2): Pt will complete toilet transfer with mod assist OT Short Term Goal 5 (Week 2): Pt will complete LB dressing with max assist of 1 caregiver   Skilled Therapeutic Interventions/Progress Updates:    Engaged in ADL retraining with focus on initiation, attention to task, visual attention to midline, and increased participation in self-care tasks of bathing and dressing.  Pt received supine in bed reporting "tired" but willing to participate in treatment session. Completed perineal hygiene in supine with focus on carryover of hand and BLE placement to increase participation in rolling.  Pt unaware of incontinence of urine and stool.  Squat pivot transfer bed > w/c to complete bathing and dressing at sink.  Increased arousal and engagement in treatment session with pt initiating weight shifting and conversation.  Pt mod assist with sit > stand to complete LB dressing, +2 for clothing management.  Pt with increased intellectual awareness reporting "a plug" in her nose, educated on NG tube and purpose of tube feeding.  Pt able to scan to midline this session to obtain hair brush on sink and to see self in mirror.  Pt with one instance of scanning Lt of midline to locate this therapist.  Noted improvements in supported sitting  balance this session with increased head control.    Therapy Documentation Precautions:  Precautions Precautions: Fall Precaution Comments: Severe L inattention Restrictions Weight Bearing Restrictions: No Pain: Pain Assessment Pain Assessment: Faces Faces Pain Scale: No hurt Pain Location: Head Pain Intervention(s): Medication (See eMAR)  See FIM for current functional status  Therapy/Group: Individual Therapy  Rosalio LoudHOXIE, Ardell Makarewicz 02/10/2014, 10:53 AM

## 2014-02-10 NOTE — Plan of Care (Signed)
Problem: RH BOWEL ELIMINATION Goal: RH STG MANAGE BOWEL W/MEDICATION W/ASSISTANCE STG Manage Bowel with Medication with mod.Assistance.  Outcome: Progressing     

## 2014-02-10 NOTE — Plan of Care (Signed)
Problem: RH BOWEL ELIMINATION Goal: RH STG MANAGE BOWEL WITH ASSISTANCE STG Manage Bowel with max Assistance.  Outcome: Not Progressing Goal: RH STG MANAGE BOWEL W/MEDICATION W/ASSISTANCE STG Manage Bowel with Medication with mod Assistance.  Outcome: Progressing Goal: RH STG MANAGE BOWEL W/EQUIPMENT W/ASSISTANCE STG Manage Bowel With Equipment With mod Assistance  Outcome: Not Applicable Date Met:  29/02/11  Problem: RH BLADDER ELIMINATION Goal: RH STG MANAGE BLADDER WITH ASSISTANCE STG Manage Bladder With max Assistance  Outcome: Not Progressing Remains incont. Of bladder-attempted timed toileting with no success

## 2014-02-10 NOTE — Progress Notes (Signed)
Patient ID: Sarah Daniel, female   DOB: 09/03/1921, 80 y.o.   MRN: [id number]  80 y.o. right handed female with history of dementia maintained on Aricept, coronary artery disease, hypertension. By report patient lives with her daughter independent prior to admission and assistance as needed. Presented 01/29/2014 after being found down by her daughter with left-sided weakness and facial droop. Patient was a poor medical historian. MRI/MRA of the brain shows acute nonhemorrhagic anterior right MCA territory infarct as well as occlusion of the right internal carotid artery with partial reconstitution of the cavernous and ophthalmic segments. CT angiogram of the neck again showing right internal carotid artery occlusion as well as proximal left subclavian artery occlusion less than 10 mm from the aortic arch. Patient did not receive TPA. Echocardiogram with ejection fraction of 75% grade 1 diastolic dysfunction. Subjective/Complaints: Cont NPO, no cough or gagging on secretions Severe L neglect No pain c/os noted, minimal verbal output, calls her grand daughter her niece Review of Systems - cannot obtain secondary to poor awareness     Objective: Vital Signs: Blood pressure 174/74, pulse 73, temperature 98.6 F (37 C), temperature source Oral, resp. rate 16, weight 44.5 kg (98 lb 1.7 oz), SpO2 100 %. Dg Abd 1 View  02/08/2014   CLINICAL DATA:  80 year old female - feeding tube placement.  EXAM: ABDOMEN - 1 VIEW  COMPARISON:  Prior studies  FINDINGS: A small bore feeding tube is identified on spot films. The tip size at the duodenal-jejunal junction, confirmed by contrast administration.  IMPRESSION: Small bore feeding tube with tip at the duodenum-jejunal junction.   Electronically Signed   By: Hassan Rowan M.D.   On: 02/08/2014 15:58   Ct Head Wo Contrast  02/09/2014   CLINICAL DATA:  Initial evaluation for tremors.  EXAM: CT HEAD WITHOUT CONTRAST  TECHNIQUE: Contiguous axial images were obtained from the  base of the skull through the vertex without intravenous contrast.  COMPARISON:  Prior MRI from 01/29/2014.  FINDINGS: Scattered hypodensity within the right frontal lobe again seen, compatible with recently identified right MCA territory infarct. Encephalomalacia more posteriorly within the right parietotemporal region is unchanged, compatible with remote infarct. No new large vessel territory infarct identified. No intracranial hemorrhage.  Advanced cerebral atrophy with chronic microvascular ischemic disease is stable.  No midline shift or mass effect. Ventricles are stable in size without evidence of hydrocephalus. No extra-axial fluid collection.  Scalp soft tissues within normal limits. No acute abnormality seen about the orbits.  Calvarium intact.  Mild to moderate mucoperiosteal thickening present within the right sphenoid sinus. A nasogastric tube is partially visualized within the right nasal cavity. No mastoid effusion.  IMPRESSION: 1. Continued interval evolution of right MCA territory infarct. No hemorrhagic conversion or significant mass effect. 2. Remote right temporoparietal infarct, stable. 3. No new intracranial process identified. 4. Advanced cerebral atrophy with chronic microvascular ischemic disease, stable from prior.   Electronically Signed   By: Jeannine Boga M.D.   On: 02/09/2014 02:32   Dg Loyce Dys Tube Plc W/fl-no Rad  02/08/2014   CLINICAL DATA:    NASO G TUBE PLACEMENT WITH FLUORO  Fluoroscopy was utilized by the requesting physician.  No radiographic  interpretation.    Dg Swallowing Func-speech Pathology  02/09/2014   Selinda Orion Page, CCC-SLP     02/09/2014  4:45 PM   Objective Swallowing Evaluation: Modified Barium Swallowing Study   Patient Details  Name: Sarah Daniel MRN: [id number] Date of Birth: 09/23/1921  Today's Date:  02/09/2014 Time: 5366-4403   Past Medical History:  Past Medical History  Diagnosis Date  . Dementia   . Hypertension   . GERD (gastroesophageal reflux  disease)   . Thyroid disease   . PUD (peptic ulcer disease)   . Coronary artery disease   . Hypothyroidism   . Arthritis    Past Surgical History:  Past Surgical History  Procedure Laterality Date  . Abdominal hysterectomy     HPI:    Sarah Daniel is a 80 y.o. right handed female with history of  dementia maintained on Aricept, coronary artery disease,  hypertension. By report patient lives with her daughter,  independent prior to admission with assistance as needed.  Presented 01/29/2014 after being found down by her daughter with  left-sided weakness and facial droop. MRI/MRA of the brain shows  acute non-hemorrhagic anterior right MCA territory infarct.  Swallow evaluation completed per speech therapy 01/30/2014 with  recommendation for NPO with nasogastric tube for nutritional  support.  MBS ordered today to objectively determine readiness  for diet initiation.       Recommendation/Prognosis  Clinical Impression:   Dysphagia Diagnosis: Severe pharyngeal phase dysphagia;Severe  oral phase dysphagia  Pt presents with a severe oropharyngeal dysphagia with sensory,  motor, and cognitive components.  Oral phase impairments are  characterized by significant left labial, lingual, and buccal  weakness which resulted in weak pumping mechanism to slowly  transit materials to the oropharynx.  Pt was also noted with  decreased sustained attention which, in combination with the oral  motor deficits listed above, resulted in oral holding (lasting  ~1-2 minutes on average) with pt requiring max cuing to  posteriorly transit materials.  The abovementioned deficits, in  combination with decreased pharyngeal sensation resulted in  premature spillage of materials with pharyngeal swallow response  triggered at the level of the vallecula.  While pt's pharyngeal  strength was grossly within functional limits, pt's delayed  swallow initiation resulted in decreased airway protection with  intermittent silent penetration and aspiration  of nectar thick  and thin liquids.  No aspiration or penetration visualized with  limited trials of honey thick liquids via teaspoon; however, SLP  suspects that, given pt's oral phase impairments, swallow delay,  and cognitive deficits, pt would eventually penetrate or aspirate  honey thick liquids over time as well.  Pt is not only at risk  for aspirating PO but is also likely aspirating her own  secretions given persistently wet vocal quality noted at bedside  and poor initiation for use of throat clear to more adequately  manage secretions.  Recommend a palliative care consult to  establish pt's and family's wishes going forward given that pt's  recovery will likely be slow and may likely involve end of life  issues.  Pt would continue to benefit from skilled ST while  inpatient for ongoing family education related to comfort feeds  versus PEG versus initiation of PO diet depending on family's  wishes.  Pt may also benefit from small amounts of pureed  consistencies per her request for comfort until family's wishes  have been more clearly established.     Swallow Evaluation Recommendations:  Diet Recommendations: NPO Medication Administration: Via alternative means Oral Care Recommendations: Oral care Q4 per protocol Follow up Recommendations: Home health SLP;24 hour  supervision/assistance;Skilled Nursing facility;Other (comment)  (palliative care )    Prognosis:  Prognosis for Safe Diet Advancement: Guarded Barriers to Reach Goals: Cognitive deficits;Time post  onset;Severity of dysphagia  Individuals Consulted: Consulted and Agree with Results and  Recommendations: Patient unable/family or caregiver not available      SLP Assessment/Plan  Plan:   See care plan    Short Term Goals: Week 1: SLP Short Term Goal 1 (Week 1): Pt will  demonstrate an increased swallow initiation with Max A multimodal  cues to utilize swallowing compensatory strategies. SLP Short Term Goal 2 (Week 1): Pt will demonstrated increased   sustained attention with Max A multimodal cues. SLP Short Term Goal 3 (Week 1): Pt will utilize external memory  aids to increase orientation with Max A multimodal cues. SLP Short Term Goal 4 (Week 1): Pt will demonstrate functional  problem solving for basic and familiar tasks with Max A  multimodal cues.  SLP Short Term Goal 5 (Week 1): Pt will use call bell to request  assistance with Max A multimodal cues. SLP Short Term Goal 6 (Week 1): Pt will utilize over articulation  and increased vocal intensity to increase speech intelligibility  with Max A multimodal cues.     General: Date of Onset: 01/29/14 Type of Study: Modified Barium Swallowing Study Reason for Referral: Objectively evaluate swallowing function Previous Swallow Assessment: BSE 10/28 Diet Prior to this Study: NPO Temperature Spikes Noted: No Respiratory Status: Room air History of Recent Intubation: No Behavior/Cognition: Lethargic;Requires cueing;Decreased sustained  attention Oral Cavity - Dentition: Dentures, top Oral Motor / Sensory Function: Impaired - see Bedside swallow  eval Self-Feeding Abilities: Total assist Patient Positioning: Upright in chair Baseline Vocal Quality: Wet;Low vocal intensity Volitional Cough: Weak Anatomy: Within functional limits Pharyngeal Secretions: Not observed secondary MBS   Reason for Referral:   Objectively evaluate swallowing function    Oral Phase: Oral Preparation/Oral Phase Oral Phase: Impaired Oral - Honey Oral - Honey Teaspoon: Reduced posterior propulsion;Delayed oral  transit;Weak lingual manipulation;Lingual pumping;Lingual/palatal  residue;Holding of bolus Oral - Nectar Oral - Nectar Teaspoon: Weak lingual manipulation;Reduced  posterior propulsion;Delayed oral transit;Lingual/palatal  residue;Lingual pumping;Holding of bolus Oral - Nectar Cup: Weak lingual manipulation;Reduced posterior  propulsion;Delayed oral transit;Lingual/palatal residue;Holding  of bolus;Lingual pumping Oral - Thin Oral - Thin  Cup: Weak lingual manipulation;Reduced posterior  propulsion;Delayed oral transit;Lingual/palatal residue;Lingual  pumping;Holding of bolus Oral - Solids Oral - Puree: Weak lingual manipulation;Reduced posterior  propulsion;Delayed oral transit;Lingual/palatal residue;Lingual  pumping;Holding of bolus   Pharyngeal Phase:  Pharyngeal Phase Pharyngeal Phase: Impaired Pharyngeal - Honey Pharyngeal - Honey Teaspoon: Premature spillage to  valleculae;Delayed swallow initiation Pharyngeal - Nectar Pharyngeal - Nectar Teaspoon: Delayed swallow  initiation;Premature spillage to valleculae Pharyngeal - Nectar Cup: Delayed swallow initiation;Premature  spillage to valleculae;Penetration/Aspiration during swallow Penetration/Aspiration details (nectar cup): Material enters  airway, passes BELOW cords without attempt by patient to eject  out (silent aspiration);Material enters airway, remains ABOVE  vocal cords then ejected out;Material enters airway, CONTACTS  cords and not ejected out Pharyngeal - Thin Pharyngeal - Thin Cup: Delayed swallow initiation;Premature  spillage to valleculae;Penetration/Aspiration during swallow Penetration/Aspiration details (thin cup): Material enters  airway, passes BELOW cords without attempt by patient to eject  out (silent aspiration);Material enters airway, remains ABOVE  vocal cords then ejected out;Material enters airway, CONTACTS  cords and not ejected out Pharyngeal - Solids Pharyngeal - Puree: Delayed swallow initiation;Premature spillage  to valleculae      GN          Page, Selinda Orion 02/09/2014, 4:09 PM                    Results for  orders placed or performed during the hospital encounter of 02/01/14 (from the past 72 hour(s))  Creatinine, serum     Status: Abnormal   Collection Time: 02/08/14  7:12 AM  Result Value Ref Range   Creatinine, Ser 0.59 0.50 - 1.10 mg/dL   GFR calc non Af Amer 78 (L) >90 mL/min   GFR calc Af Amer >90 >90 mL/min    Comment: (NOTE) The eGFR has been  calculated using the CKD EPI equation. This calculation has not been validated in all clinical situations. eGFR's persistently <90 mL/min signify possible Chronic Kidney Disease.      HEENT: Left facial droop Cardio: RRR and no murmur Resp: CTA B/L and unlabored GI: BS positive and NT, ND Extremity:  No Edema Skin:   Bruise LUE and LLE Neuro: Confused, Flat, Cranial Nerve Abnormalities Left central VII, Abnormal Sensory Left hemisensory def, Abnormal Motor 2- biceps, shoulder add, 2- L HF, KE, synergy pattern  0/5 Left ankle, Dysarthric and Inattention Musc/Skel:  Other no pain with A/PROM of UE and LE NAD, severe R gaze pref   Assessment/Plan: 1. 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 Physiatrist is providing close team supervision and 24 hour management of active medical problems listed below. Physiatrist and rehab team continue to assess barriers to discharge/monitor patient progress toward functional and medical goals. Appreciate palliative care note Continue tube feeds until current tube comes out or quits functioning or until patient transferred to inpatient hospice FIM: FIM - Bathing Bathing Steps Patient Completed: Chest, Abdomen, Front perineal area, Right upper leg, Left upper leg, Right lower leg (including foot), Left lower leg (including foot) Bathing: 3: Mod-Patient completes 5-7 32f10 parts or 50-74%  FIM - Upper Body Dressing/Undressing Upper body dressing/undressing steps patient completed: Put head through opening of pull over shirt/dress Upper body dressing/undressing: 2: Max-Patient completed 25-49% of tasks FIM - Lower Body Dressing/Undressing Lower body dressing/undressing steps patient completed: Thread/unthread right pants leg Lower body dressing/undressing: 1: Two helpers (at sit > stand)  FIM -  Toileting Toileting: 0: No continent bowel/bladder events this shift  FIM - TAir cabin crewTransfers: 0-Activity did not occur  FIM - BControl and instrumentation engineerDevices: Bed rails, Arm rests Bed/Chair Transfer: 3: Supine > Sit: Mod A (lifting assist/Pt. 50-74%/lift 2 legs, 2: Bed > Chair or W/C: Max A (lift and lower assist)  FIM - Locomotion: Wheelchair Distance: 20 Locomotion: Wheelchair: 1: Travels less than 50 ft with moderate assistance (Pt: 50 - 74%) FIM - Locomotion: Ambulation Locomotion: Ambulation Assistive Devices: Other (comment) (R HHA and therapist at L side) Ambulation/Gait Assistance: 1: +2 Total assist Locomotion: Ambulation: 1: Two helpers  Comprehension Comprehension Mode: Auditory Comprehension: 2-Understands basic 25 - 49% of the time/requires cueing 51 - 75% of the time  Expression Expression Mode: Verbal Expression: 2-Expresses basic 25 - 49% of the time/requires cueing 50 - 75% of the time. Uses single words/gestures.  Social Interaction Social Interaction: 2-Interacts appropriately 25 - 49% of time - Needs frequent redirection.  Problem Solving Problem Solving: 2-Solves basic 25 - 49% of the time - needs direction more than half the time to initiate, plan or complete simple activities  Memory Memory: 1-Recognizes or recalls less than 25% of the time/requires cueing greater than 75% of the time  Medical Problem List and Plan: 1. Functional deficits secondary to right MCA infarct with dense  left hemiparesis and visual-spatial deficits 2.  DVT Prophylaxis/Anticoagulation: Subcutaneous Lovenox. Monitor platelet counts and any signs of bleeding 3. Pain Management: Ultram as needed. 4. Mood/depression/dementia. Aricept 10 mg daily at bedtime, Effexor 75 mg daily. Discussed baseline cognition with family 5. Neuropsych: This patient is not capable of making decisions on her own behalf. 6. Skin/Wound Care: Routine skin checks as  well as routine turning to maintain skin integrity 7. Fluids/Electrolytes/Nutrition: Follow-up chemistries. Strict I and O's.Hx CKD, monitor renal status 8. Dysphagia. Severe NPO Nasogastric tube feeds. Follow-up speech therapy. IV fluids to maintain hydration  swallowing recovery prognosis is guarded, very long duration at best 9. Hypothyroidism. Synthroid. Latest TSH level 2.929 10. GERD. Protonix 11. Hx CAD, ASA   12.  Mild hypokalemia LOS (Days) 9 A FACE TO FACE EVALUATION WAS PERFORMED  KIRSTEINS,ANDREW E 02/10/2014, 2:34 PM

## 2014-02-10 NOTE — Plan of Care (Signed)
Problem: RH BOWEL ELIMINATION Goal: RH STG MANAGE BOWEL W/EQUIPMENT W/ASSISTANCE STG Manage Bowel With Equipment With mod Assistance  Outcome: Progressing     

## 2014-02-10 NOTE — Progress Notes (Signed)
Speech Language Pathology Weekly Progress and Session Note  Patient Details  Name: Sarah Daniel MRN: [id number] Date of Birth: [birth year]  Beginning of progress report period: February 03, 2014 End of progress report period: February 10, 2014  Today's Date: 02/10/2014 SLP Individual Time: 1100-1200 SLP Individual Time Calculation (min): 60 min  Short Term Goals: Week 1: SLP Short Term Goal 1 (Week 1): Pt will demonstrate an increased swallow initiation with Max A multimodal cues to utilize swallowing compensatory strategies. SLP Short Term Goal 1 - Progress (Week 1): Met SLP Short Term Goal 2 (Week 1): Pt will demonstrated increased sustained attention with Max A multimodal cues. SLP Short Term Goal 2 - Progress (Week 1): Met SLP Short Term Goal 3 (Week 1): Pt will utilize external memory aids to increase orientation with Max A multimodal cues. SLP Short Term Goal 3 - Progress (Week 1): Progressing toward goal SLP Short Term Goal 4 (Week 1): Pt will demonstrate functional problem solving for basic and familiar tasks with Max A multimodal cues.  SLP Short Term Goal 4 - Progress (Week 1): Progressing toward goal SLP Short Term Goal 5 (Week 1): Pt will use call bell to request assistance with Max A multimodal cues. SLP Short Term Goal 5 - Progress (Week 1): Progressing toward goal SLP Short Term Goal 6 (Week 1): Pt will utilize over articulation and increased vocal intensity to increase speech intelligibility with Max A multimodal cues.  SLP Short Term Goal 6 - Progress (Week 1): Progressing toward goal    New Short Term Goals: Week 2: SLP Short Term Goal 1 (Week 2): Pt will demonstrate an increased swallow initiation with Mod A multimodal cues to utilize swallowing compensatory strategies. SLP Short Term Goal 2 (Week 2): Pt will demonstrate increased sustained attention with Mod A multimodal cues. SLP Short Term Goal 3 (Week 2): Pt will utilize external memory aids to increase orientation  with Max A multimodal cues. SLP Short Term Goal 4 (Week 2): Pt will demonstrate functional problem solving for basic and familiar tasks with Max A multimodal cues.  SLP Short Term Goal 5 (Week 2): Pt will use call bell to request assistance with Max A multimodal cues. SLP Short Term Goal 6 (Week 2): Pt will utilize over articulation and increased vocal intensity to increase speech intelligibility with Max A multimodal cues.   Weekly Progress Updates:  Pt made slow gains this reporting period and has met 2 out of 6 short term goals due to improved swallow initiation with trials and improved sustained attention during basic therapeutic tasks.  Pt currently requires overall max assist for cognitive-linguistic tasks and remains NPO due to significant oral delays with intermittent silent penetration and aspiration with thickened and thin liquids.  Palliative care consult was completed 02/09/2014 with family deciding to forego PEG placement and not replace NG tube should pt pull it out again.  Per report,initiation of comfort feeds of pureed consistencies recommended at pt's request.  A family meeting is scheduled with palliative care for 11/9 at which point pt's family will make further decisions regarding pt's plan of care.  SLP will follow up and update plan of care as indicated.  Pt would continue to benefit from skilled ST while inpatient to reduce burden of care upon discharge and provide skilled education related to pt's aspiration risk with comfort feeds.    Intensity: Minumum of 1-2 x/day, 30 to 90 minutes Frequency: 5 out of 7 days Duration/Length of Stay: 22-30 days Treatment/Interventions: Cognitive remediation/compensation;Cueing  hierarchy;Dysphagia/aspiration precaution training;Patient/family education;Oral motor exercises;Functional tasks;Internal/external aids;Therapeutic Activities   Daily Session  Skilled Therapeutic Interventions: Pt was seen for skilled ST targeting goals for dysphagia  and cognition.  Pt received at RN station, awake, alert, and pleasantly interactive with min encouragement.  SLP facilitated the session with trials of magic cup consistencies per pt's request.  Skilled observations were completed during the abovementioned trials with pt exhibiting improved timeliness for swallow initiation with min assist verbal cues.  SLP suspects that cold stimuli and improved alertness while seated upright in chair were primary contributing factors to pt's improved clinical presentation; however, after ~five 1/2 teaspoon trials, pt began to exhibit increased fatigue with prolonged swallow delay followed by worsening wet vocal quality.  Therefore, trials ended.  SLP further facilitated the session with a basic sorting task targeting sustained attention and visual scanning to the left with pt requiring overall max cues to complete task.  Goals updated on this date to reflect current progress and plan of care.         FIM:  Comprehension Comprehension Mode: Auditory Comprehension: 3-Understands basic 50 - 74% of the time/requires cueing 25 - 50%  of the time Expression Expression Mode: Verbal Expression: 3-Expresses basic 50 - 74% of the time/requires cueing 25 - 50% of the time. Needs to repeat parts of sentences. Social Interaction Social Interaction: 2-Interacts appropriately 25 - 49% of time - Needs frequent redirection. Problem Solving Problem Solving: 2-Solves basic 25 - 49% of the time - needs direction more than half the time to initiate, plan or complete simple activities Memory Memory: 1-Recognizes or recalls less than 25% of the time/requires cueing greater than 75% of the time FIM - Eating Eating Activity: 4: Helper occasionally brings food to mouth;4: Helper occasionally scoops food on utensil (with trials )  Pain Pain Assessment Pain Assessment: Faces Pain Score: 0-No pain  Therapy/Group: Individual Therapy  Windell Moulding, M.A. CCC-SLP  Cloyce Blankenhorn, Selinda Orion 02/10/2014, 4:13 PM

## 2014-02-10 NOTE — Plan of Care (Signed)
Problem: RH BOWEL ELIMINATION Goal: RH STG MANAGE BOWEL W/EQUIPMENT W/ASSISTANCE STG Manage Bowel With Equipment With mod Assistance  Outcome: Progressing

## 2014-02-10 NOTE — Plan of Care (Signed)
Problem: RH SKIN INTEGRITY Goal: RH STG SKIN FREE OF INFECTION/BREAKDOWN Max Assist Outcome: Progressing  Problem: RH SAFETY Goal: RH STG ADHERE TO SAFETY PRECAUTIONS W/ASSISTANCE/DEVICE STG Adhere to Safety Precautions With Mod Assistance/Device.  Outcome: Progressing

## 2014-02-10 NOTE — Plan of Care (Signed)
Problem: RH PAIN MANAGEMENT Goal: RH STG PAIN MANAGED AT OR BELOW PT'S PAIN GOAL <2  Outcome: Progressing  Problem: RH KNOWLEDGE DEFICIT Goal: RH STG INCREASE KNOWLEDGE OF DYSPHAGIA/FLUID INTAKE Outcome: Progressing

## 2014-02-10 NOTE — Progress Notes (Signed)
Physical Therapy Session Note  Patient Details  Name: Sarah Daniel MRN: [id number] Date of Birth: 1921/09/13  Today's Date: 02/10/2014 PT Individual Time: 1300-1400 PT Individual Time Calculation (min): 60 min   Short Term Goals: Week 2:  PT Short Term Goal 1 (Week 2): Pt will perform supine<>sit with mod A with HOB elevated 30 degrees using bed rail. PT Short Term Goal 2 (Week 2): Pt will perform bed<>w/c transfers with min A, 50% of the time. PT Short Term Goal 3 (Week 2): Pt will perform w/c mobility x100' with mod A and 50% cueing for attention to L visual field. PT Short Term Goal 4 (Week 2): Pt will ambulate x100' with mod A of one person (and w/c follow for safety, if needed). PT Short Term Goal 5 (Week 2): Pt will track head/eyes to midline to midline with mod cueing, 50% of the time.  Skilled Therapeutic Interventions/Progress Updates:    2:1. Pt received semi reclined in bed with visitor present; agreeable to therapy. Session focused on increasing pt attention to L side body and visual field of visual field. Performed supine>sit (to L side) with bed rail and HOB flat with max cueing for attention to L side, multimodal cueing to initiate LLE advancement to EOB, and tactile cueing for weightbearing through LUE. Performed multiple stand pivot transfers with max A to R side, Total to +2A to L side. Transported pt to ortho gym with total A. Modified reclining w/c to standard w/c to increase pt independence with w/c mobility. In standard hemi height w/c, pt performed w/c mobility x10' in controlled environment with R hemi technique with mod A and max multimodal cueing for forward gaze, attention to L visual field, obstacle negotiation; tactile cueing at R knee for weightbearing. Based on this w/c mobility trial, determined reclining w/c to be more appropriate for pt at this time to facilitate upright posture. Performed gait x75' in controlled environment with +2A for R HHA and therapist  positioned at L side providing tactile cueing at L ribcage, R posterior pelvis for upright posture; verbal cueing for upright posture, forward gaze, and attention to L visual field. Seated EOM with LUE support and no LE support (to prevent pushing to L side), pt performed RUE reaching for objects initially at midline and progressing to just L of midline with max multimodal cueing and increased time. Pt left reclined in w/c at nurses' station with quick release belt in place for safety, mitt on R hand, and all needs within reach.  Therapy Documentation Precautions:  Precautions Precautions: Fall Precaution Comments: Severe L inattention Restrictions Weight Bearing Restrictions: No Vital Signs: Therapy Vitals Temp: 98.6 F (37 C) Temp Source: Oral Pulse Rate: 73 Resp: 16 BP: (!) 174/74 mmHg Patient Position (if appropriate): Sitting Oxygen Therapy SpO2: 100 % O2 Device: Not Delivered Pain: Pain Assessment Pain Assessment: Faces Pain Score: 0-No pain Faces Pain Scale: No hurt Locomotion : Ambulation Ambulation/Gait Assistance: 1: +2 Total assist Wheelchair Mobility Distance: 10   See FIM for current functional status  Therapy/Group: Individual Therapy  Calvert CantorHobble, Zephaniah Enyeart A 02/10/2014, 4:46 PM

## 2014-02-10 NOTE — Progress Notes (Signed)
Social Work Patient ID: [place], female   DOB: 1921/05/22, 80 y.o.   MRN: [id number] Spoke with Harless LittenKaren -[employer] medicare she has approved through 11/11.  Will update due to on 11/11.

## 2014-02-10 NOTE — Plan of Care (Signed)
Problem: RH BOWEL ELIMINATION Goal: RH STG MANAGE BOWEL WITH ASSISTANCE STG Manage Bowel with max Assistance.  Outcome: Progressing     

## 2014-02-11 ENCOUNTER — Inpatient Hospital Stay (HOSPITAL_COMMUNITY): Payer: Medicare Other | Admitting: Speech Pathology

## 2014-02-11 ENCOUNTER — Other Ambulatory Visit: Payer: Medicare Other

## 2014-02-11 ENCOUNTER — Inpatient Hospital Stay (HOSPITAL_COMMUNITY): Payer: Medicare Other | Admitting: Occupational Therapy

## 2014-02-11 ENCOUNTER — Inpatient Hospital Stay (HOSPITAL_COMMUNITY): Payer: Medicare Other | Admitting: Rehabilitation

## 2014-02-11 MED ORDER — ACETAMINOPHEN 650 MG RE SUPP
325.0000 mg | RECTAL | Status: DC | PRN
  Administered 2014-02-11 – 2014-02-13 (×2): 650 mg via RECTAL
  Administered 2014-02-13: 325 mg via RECTAL
  Administered 2014-02-14: 650 mg via RECTAL
  Administered 2014-02-14: 325 mg via RECTAL
  Administered 2014-02-14 – 2014-02-15 (×4): 650 mg via RECTAL
  Filled 2014-02-11 (×9): qty 1

## 2014-02-11 MED ORDER — LEVOTHYROXINE SODIUM 100 MCG IV SOLR
25.0000 ug | Freq: Every day | INTRAVENOUS | Status: DC
  Administered 2014-02-12 – 2014-02-14 (×2): 25 ug via INTRAVENOUS
  Filled 2014-02-11 (×5): qty 5

## 2014-02-11 NOTE — Plan of Care (Signed)
Problem: RH SKIN INTEGRITY Goal: RH STG SKIN FREE OF INFECTION/BREAKDOWN Max Assist Outcome: Progressing

## 2014-02-11 NOTE — Plan of Care (Signed)
Problem: RH SKIN INTEGRITY Goal: RH STG SKIN FREE OF INFECTION/BREAKDOWN Max Assist Outcome: Progressing Goal: RH STG MAINTAIN SKIN INTEGRITY WITH ASSISTANCE STG Maintain Skin Integrity With Max Assistance.  Outcome: Progressing

## 2014-02-11 NOTE — Plan of Care (Signed)
Problem: RH BOWEL ELIMINATION Goal: RH STG MANAGE BOWEL WITH ASSISTANCE STG Manage Bowel with max Assistance.  Outcome: Progressing Goal: RH STG MANAGE BOWEL W/MEDICATION W/ASSISTANCE STG Manage Bowel with Medication with mod Assistance.  Outcome: Progressing  Problem: RH BLADDER ELIMINATION Goal: RH STG MANAGE BLADDER WITH EQUIPMENT WITH ASSISTANCE STG Manage Bladder With Equipment With min Assistance  Outcome: Progressing

## 2014-02-11 NOTE — Progress Notes (Signed)
Physical Therapy Session Note  Patient Details  Name: Sarah Daniel MRN: [id number] Date of Birth: 13-Apr-1921  Today's Date: 02/11/2014 PT Individual Time: 1500-1600 PT Individual Time Calculation (min): 60 min   Short Term Goals: Week 2:  PT Short Term Goal 1 (Week 2): Pt will perform supine<>sit with mod A with HOB elevated 30 degrees using bed rail. PT Short Term Goal 2 (Week 2): Pt will perform bed<>w/c transfers with min A, 50% of the time. PT Short Term Goal 3 (Week 2): Pt will perform w/c mobility x100' with mod A and 50% cueing for attention to L visual field. PT Short Term Goal 4 (Week 2): Pt will ambulate x100' with mod A of one person (and w/c follow for safety, if needed). PT Short Term Goal 5 (Week 2): Pt will track head/eyes to midline to midline with mod cueing, 50% of the time.  Skilled Therapeutic Interventions/Progress Updates:   Pt received lying in bed, agreeable to therapy.  Performed bed mobility supine <>sit with HOB slightly elevated and with bed rail at mod/max A level.  Cues for following R hand across midline to find bedrail on the L.  Pt finally able to come to midline to find therapist, however did grasp bed rail for assist.  Once at EOB, requires min to mod A for sitting balance with facilitation for upright head and chest.  Had pt find shoes at midline to grasp and place at foot.  Pt able to adjust R shoe onto foot, but requires total A for L shoe.  Transferred bed<>w/c and w/c>mat via squat pivot transfer with +2 assist (pt assist 30%).  Pt did initiate movement, but requires assist for hips over to chair.  Assisted to/from therapy gym at total A level via w/c to participate in gait for upright posture, NMR through LLE, orientation to midline, as well as seated NMR for postural control, upright posture, scanning to midline and L, reaching with R hand across midline to identify colors of horse shoe and progressed to performing seated nustep x 4 mins at level 1 resistance  with BLEs and RUE.  PT assisting with upright posture and midline head while rehab tech assisted with maintaining LLE in alignment.  Pt did well initiating tasks during session.  Requires mod to max A for upright head and chest posture during seated tasks.  Pt assisted back to room and back to bed as stated above.  All needs in reach with 3 bed rails and bed alarm set.    Therapy Documentation Precautions:  Precautions Precautions: Fall Precaution Comments: Severe L inattention Restrictions Weight Bearing Restrictions: No   Vital Signs: Therapy Vitals Temp: 98.2 F (36.8 C) Temp Source: Axillary Pulse Rate: 85 Resp: 17 BP: (!) 164/80 mmHg Patient Position (if appropriate): Lying Oxygen Therapy SpO2: 97 % O2 Device: Not Delivered Pain: Pt with reports of headache, RN notified.    Locomotion : Ambulation Ambulation/Gait Assistance: 1: +2 Total assist   See FIM for current functional status  Therapy/Group: Individual Therapy  Vista Deckarcell, Vanette Noguchi Ann 02/11/2014, 5:09 PM

## 2014-02-11 NOTE — Progress Notes (Signed)
At 0815 the patient's [name] tube came out while she was moving from bed to wheelchair with assist

## 2014-02-11 NOTE — Progress Notes (Signed)
Occupational Therapy Session Note  Patient Details  Name: Sarah Daniel MRN: [id number] Date of Birth: 1921/09/04  Today's Date: 02/11/2014 OT Individual Time: 4098-11910730-0830 OT Individual Time Calculation (min): 60 min    Short Term Goals: Week 2:  OT Short Term Goal 1 (Week 2): Pt will perform grooming with min A at sink in order to increase participation with self care. OT Short Term Goal 2 (Week 2): Pt will perform UB dressing with Mod A  utilizing modifcations as needed in order to increase participation with self care. OT Short Term Goal 3 (Week 2): Pt will locate items to the Lt during ADL tasks with min verbal cues during bathing and dressing session.  OT Short Term Goal 4 (Week 2): Pt will complete toilet transfer with mod assist OT Short Term Goal 5 (Week 2): Pt will complete LB dressing with max assist of 1 caregiver   Skilled Therapeutic Interventions/Progress Updates:    Engaged in ADL retraining with focus on increased participation, following directions, midline head orientation, and midline sitting balance.  Pt received in bed with pronounced Rt head turn, requiring gentle stretching and increased positioning to achieve midline head positioning.  Perineal hygiene completed at bed level with pt rolling Rt and Lt, requiring hand over hand assist to position Rt hand on bed rail to Lt and assist to position BLE to increase participation in rolling in bed.  UB bathing and dressing completed seated EOB with focus on midline orientation and initiation, provided hand over hand assist to incorporate LUE into washing Rt arm and applying deodorant.  Performed squat pivot transfer bed > w/c to Rt with pt initiating reaching to w/c and standing with mod assist from therapist, +2 with second person providing min assist with guiding weight shift to chair.  Pt seated in front of mirror for oral care and hair brushing with pt utilizing mirror for midline attention.  Provided assist with suction toothbrush  to manage water and secretions.  Pt left in w/c with granddaughter present.  Pt's NG tube not completely secured upon arrival, informed RN.  During session NG tube appeared to be slipping and requested RN to secure, however during session NG tube was dislodged by pt and during movement.  Therapy Documentation Precautions:  Precautions Precautions: Fall Precaution Comments: Severe L inattention Restrictions Weight Bearing Restrictions: No Pain: Pain Assessment Pain Assessment: Faces Pain Score: 0-No pain  See FIM for current functional status  Therapy/Group: Individual Therapy  Rosalio LoudHOXIE, SARAH 02/11/2014, 10:25 AM

## 2014-02-11 NOTE — Plan of Care (Signed)
Problem: RH BLADDER ELIMINATION Goal: RH STG MANAGE BLADDER WITH MEDICATION WITH ASSISTANCE STG Manage Bladder With Medication With max Assistance.  Outcome: Progressing     

## 2014-02-11 NOTE — Plan of Care (Signed)
Problem: RH SKIN INTEGRITY Goal: RH STG MAINTAIN SKIN INTEGRITY WITH ASSISTANCE STG Maintain Skin Integrity With Max Assistance.  Outcome: Progressing     

## 2014-02-11 NOTE — Plan of Care (Signed)
Problem: RH PAIN MANAGEMENT Goal: RH STG PAIN MANAGED AT OR BELOW PT'S PAIN GOAL <2  Outcome: Progressing

## 2014-02-11 NOTE — Progress Notes (Signed)
I spoke with Sarah Daniel's daughter, Sarah Daniel, over the phone. She is aware that the NGT feeding tube has come out today. We will continue the plan not to place another NGT. Will continue IVF and therapies as tolerated. Proceed with comfort feeds and offer small bites of meals if desired for comfort and quality of life but I would not encourage large amounts of intake. I will meet again Monday at 0800-0830 am with Sarah Daniel's two daughters for next steps towards comfort.   Yong ChannelAlicia Manila Rommel, NP Palliative Medicine Team Pager # [phone] (M-F 8a-5p) Team Phone # [phone] (Nights/Weekends)

## 2014-02-11 NOTE — Progress Notes (Signed)
Speech Language Pathology Daily Session Note  Patient Details  Name: Sarah [place] MRN: [id number] Date of Birth: 11-22-1921  Today's Date: 02/11/2014 SLP Individual Time: [id number] SLP Individual Time Calculation (min): 45 min  Short Term Goals: Week 2: SLP Short Term Goal 1 (Week 2): Pt will demonstrate an increased swallow initiation with Mod A multimodal cues to utilize swallowing compensatory strategies. SLP Short Term Goal 2 (Week 2): Pt will demonstrate increased sustained attention with Mod A multimodal cues. SLP Short Term Goal 3 (Week 2): Pt will utilize external memory aids to increase orientation with Max A multimodal cues. SLP Short Term Goal 4 (Week 2): Pt will demonstrate functional problem solving for basic and familiar tasks with Max A multimodal cues.  SLP Short Term Goal 5 (Week 2): Pt will use call bell to request assistance with Max A multimodal cues. SLP Short Term Goal 6 (Week 2): Pt will utilize over articulation and increased vocal intensity to increase speech intelligibility with Max A multimodal cues.   Skilled Therapeutic Interventions:  Pt was seen for skilled ST targeting goals for dysphagia and cognition.  Per report, pt's NG tube had become dislodged prior to arrival and had been removed.  Pt seated upright in wheelchair, awake, lethargic but agreeable to participate in ST.  SLP facilitated the session with trials of pureed consistencies per pt's request.  Pt required overall mod assist verbal cues to improve oral delay and facilitate timeliness of swallow initiation.  No overt s/s of aspiration noted at bedside; however, SLP stopped trials following one 1/2 teaspoon secondary to increasing lethargy.  SLP took pt outside of room to facilitate improved alertness and engaged her in a personally meaningful activity (watering plants, which was a favorite past time of pt's prior to admission per family).  Pt required max to total assist to sustain attention to task with  increasing work required to maintain an upright posture.  As a result, SLP ended session early and returned pt back to bed with assist from nurse techs.    FIM:  Comprehension Comprehension Mode: Auditory Comprehension: 3-Understands basic 50 - 74% of the time/requires cueing 25 - 50%  of the time Expression Expression Mode: Verbal Expression: 3-Expresses basic 50 - 74% of the time/requires cueing 25 - 50% of the time. Needs to repeat parts of sentences. Social Interaction Social Interaction: 2-Interacts appropriately 25 - 49% of time - Needs frequent redirection. Problem Solving Problem Solving: 2-Solves basic 25 - 49% of the time - needs direction more than half the time to initiate, plan or complete simple activities Memory Memory: 1-Recognizes or recalls less than 25% of the time/requires cueing greater than 75% of the time FIM - Eating Eating Activity: 1: Helper feeds patient (with trials )  Pain Pain Assessment Pain Assessment: Faces Pain Score: 0-No pain Faces Pain Scale: No hurt  Therapy/Group: Individual Therapy   Jackalyn LombardNicole Haim Hansson, M.A. CCC-SLP  Manjot Hinks, Melanee SpryNicole L 02/11/2014, 12:48 PM

## 2014-02-11 NOTE — Plan of Care (Signed)
Problem: RH BLADDER ELIMINATION Goal: RH STG MANAGE BLADDER WITH ASSISTANCE STG Manage Bladder With max Assistance  Outcome: Progressing     

## 2014-02-11 NOTE — Progress Notes (Signed)
Patient ID: Sarah Daniel, female   DOB: Jan 24, 1922, 80 y.o.   MRN: [id number]  80 y.o. right handed female with history of dementia maintained on Aricept, coronary artery disease, hypertension. By report patient lives with her daughter independent prior to admission and assistance as needed. Presented 01/29/2014 after being found down by her daughter with left-sided weakness and facial droop. Patient was a poor medical historian. MRI/MRA of the brain shows acute nonhemorrhagic anterior right MCA territory infarct as well as occlusion of the right internal carotid artery with partial reconstitution of the cavernous and ophthalmic segments. CT angiogram of the neck again showing right internal carotid artery occlusion as well as proximal left subclavian artery occlusion less than 10 mm from the aortic arch. Patient did not receive TPA. Echocardiogram with ejection fraction of 60% grade 1 diastolic dysfunction. Subjective/Complaints: Cont NPO, no cough or gagging on secretions Severe L neglect No pain c/os noted, minimal verbal output, calls her grand daughter her niece Review of Systems - cannot obtain secondary to poor awareness     Objective: Vital Signs: Blood pressure 119/84, pulse 93, temperature 99.2 F (37.3 C), temperature source Oral, resp. rate 17, weight 43.7 kg (96 lb 5.5 oz), SpO2 96 %. Dg Swallowing Func-speech Pathology  02/09/2014   Melanee Spry Page, CCC-SLP     02/09/2014  4:45 PM   Objective Swallowing Evaluation: Modified Barium Swallowing Study   Patient Details  Name: Sarah Daniel MRN: [id number] Date of Birth: [birth year]  Today's Date: 02/09/2014 Time: 7829-5621   Past Medical History:  Past Medical History  Diagnosis Date  . Dementia   . Hypertension   . GERD (gastroesophageal reflux disease)   . Thyroid disease   . PUD (peptic ulcer disease)   . Coronary artery disease   . Hypothyroidism   . Arthritis    Past Surgical History:  Past Surgical History  Procedure Laterality Date  .  Abdominal hysterectomy     HPI:    Sarah Daniel is a 80 y.o. right handed female with history of  dementia maintained on Aricept, coronary artery disease,  hypertension. By report patient lives with her daughter,  independent prior to admission with assistance as needed.  Presented 01/29/2014 after being found down by her daughter with  left-sided weakness and facial droop. MRI/MRA of the brain shows  acute non-hemorrhagic anterior right MCA territory infarct.  Swallow evaluation completed per speech therapy 01/30/2014 with  recommendation for NPO with nasogastric tube for nutritional  support.  MBS ordered today to objectively determine readiness  for diet initiation.       Recommendation/Prognosis  Clinical Impression:   Dysphagia Diagnosis: Severe pharyngeal phase dysphagia;Severe  oral phase dysphagia  Pt presents with a severe oropharyngeal dysphagia with sensory,  motor, and cognitive components.  Oral phase impairments are  characterized by significant left labial, lingual, and buccal  weakness which resulted in weak pumping mechanism to slowly  transit materials to the oropharynx.  Pt was also noted with  decreased sustained attention which, in combination with the oral  motor deficits listed above, resulted in oral holding (lasting  ~1-2 minutes on average) with pt requiring max cuing to  posteriorly transit materials.  The abovementioned deficits, in  combination with decreased pharyngeal sensation resulted in  premature spillage of materials with pharyngeal swallow response  triggered at the level of the vallecula.  While pt's pharyngeal  strength was grossly within functional limits, pt's delayed  swallow initiation resulted in decreased airway protection with  intermittent  silent penetration and aspiration of nectar thick  and thin liquids.  No aspiration or penetration visualized with  limited trials of honey thick liquids via teaspoon; however, SLP  suspects that, given pt's oral phase impairments,  swallow delay,  and cognitive deficits, pt would eventually penetrate or aspirate  honey thick liquids over time as well.  Pt is not only at risk  for aspirating PO but is also likely aspirating her own  secretions given persistently wet vocal quality noted at bedside  and poor initiation for use of throat clear to more adequately  manage secretions.  Recommend a palliative care consult to  establish pt's and family's wishes going forward given that pt's  recovery will likely be slow and may likely involve end of life  issues.  Pt would continue to benefit from skilled ST while  inpatient for ongoing family education related to comfort feeds  versus PEG versus initiation of PO diet depending on family's  wishes.  Pt may also benefit from small amounts of pureed  consistencies per her request for comfort until family's wishes  have been more clearly established.     Swallow Evaluation Recommendations:  Diet Recommendations: NPO Medication Administration: Via alternative means Oral Care Recommendations: Oral care Q4 per protocol Follow up Recommendations: Home health SLP;24 hour  supervision/assistance;Skilled Nursing facility;Other (comment)  (palliative care )    Prognosis:  Prognosis for Safe Diet Advancement: Guarded Barriers to Reach Goals: Cognitive deficits;Time post  onset;Severity of dysphagia   Individuals Consulted: Consulted and Agree with Results and  Recommendations: Patient unable/family or caregiver not available      SLP Assessment/Plan  Plan:   See care plan    Short Term Goals: Week 1: SLP Short Term Goal 1 (Week 1): Pt will  demonstrate an increased swallow initiation with Max A multimodal  cues to utilize swallowing compensatory strategies. SLP Short Term Goal 2 (Week 1): Pt will demonstrated increased  sustained attention with Max A multimodal cues. SLP Short Term Goal 3 (Week 1): Pt will utilize external memory  aids to increase orientation with Max A multimodal cues. SLP Short Term Goal 4 (Week  1): Pt will demonstrate functional  problem solving for basic and familiar tasks with Max A  multimodal cues.  SLP Short Term Goal 5 (Week 1): Pt will use call bell to request  assistance with Max A multimodal cues. SLP Short Term Goal 6 (Week 1): Pt will utilize over articulation  and increased vocal intensity to increase speech intelligibility  with Max A multimodal cues.     General: Date of Onset: 01/29/14 Type of Study: Modified Barium Swallowing Study Reason for Referral: Objectively evaluate swallowing function Previous Swallow Assessment: BSE 10/28 Diet Prior to this Study: NPO Temperature Spikes Noted: No Respiratory Status: Room air History of Recent Intubation: No Behavior/Cognition: Lethargic;Requires cueing;Decreased sustained  attention Oral Cavity - Dentition: Dentures, top Oral Motor / Sensory Function: Impaired - see Bedside swallow  eval Self-Feeding Abilities: Total assist Patient Positioning: Upright in chair Baseline Vocal Quality: Wet;Low vocal intensity Volitional Cough: Weak Anatomy: Within functional limits Pharyngeal Secretions: Not observed secondary MBS   Reason for Referral:   Objectively evaluate swallowing function    Oral Phase: Oral Preparation/Oral Phase Oral Phase: Impaired Oral - Honey Oral - Honey Teaspoon: Reduced posterior propulsion;Delayed oral  transit;Weak lingual manipulation;Lingual pumping;Lingual/palatal  residue;Holding of bolus Oral - Nectar Oral - Nectar Teaspoon: Weak lingual manipulation;Reduced  posterior propulsion;Delayed oral transit;Lingual/palatal  residue;Lingual pumping;Holding of bolus Oral -  Nectar Cup: Weak lingual manipulation;Reduced posterior  propulsion;Delayed oral transit;Lingual/palatal residue;Holding  of bolus;Lingual pumping Oral - Thin Oral - Thin Cup: Weak lingual manipulation;Reduced posterior  propulsion;Delayed oral transit;Lingual/palatal residue;Lingual  pumping;Holding of bolus Oral - Solids Oral - Puree: Weak lingual  manipulation;Reduced posterior  propulsion;Delayed oral transit;Lingual/palatal residue;Lingual  pumping;Holding of bolus   Pharyngeal Phase:  Pharyngeal Phase Pharyngeal Phase: Impaired Pharyngeal - Honey Pharyngeal - Honey Teaspoon: Premature spillage to  valleculae;Delayed swallow initiation Pharyngeal - Nectar Pharyngeal - Nectar Teaspoon: Delayed swallow  initiation;Premature spillage to valleculae Pharyngeal - Nectar Cup: Delayed swallow initiation;Premature  spillage to valleculae;Penetration/Aspiration during swallow Penetration/Aspiration details (nectar cup): Material enters  airway, passes BELOW cords without attempt by patient to eject  out (silent aspiration);Material enters airway, remains ABOVE  vocal cords then ejected out;Material enters airway, CONTACTS  cords and not ejected out Pharyngeal - Thin Pharyngeal - Thin Cup: Delayed swallow initiation;Premature  spillage to valleculae;Penetration/Aspiration during swallow Penetration/Aspiration details (thin cup): Material enters  airway, passes BELOW cords without attempt by patient to eject  out (silent aspiration);Material enters airway, remains ABOVE  vocal cords then ejected out;Material enters airway, CONTACTS  cords and not ejected out Pharyngeal - Solids Pharyngeal - Puree: Delayed swallow initiation;Premature spillage  to valleculae      GN          Page, Melanee SpryNicole L 02/09/2014, 4:09 PM                    No results found for this or any previous visit (from the past 72 hour(s)).   HEENT: Left facial droop Cardio: RRR and no murmur Resp: CTA B/L and unlabored GI: BS positive and NT, ND Extremity:  No Edema Skin:   Bruise LUE and LLE Neuro: Confused, Flat, Cranial Nerve Abnormalities Left central VII, Abnormal Sensory Left hemisensory def, no AROM noted LUE this am shoulder add, 2- L HF, KE, synergy pattern  0/5 Left ankle, Dysarthric and Inattention Musc/Skel:  Other no pain with A/PROM of UE and LE NAD, severe R gaze pref Sparse  verbal output- "good morming"  Poor sitting posture  Assessment/Plan: 1. 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 Physiatrist is providing close team supervision and 24 hour management of active medical problems listed below. Physiatrist and rehab team continue to assess barriers to discharge/monitor patient progress toward functional and medical goals. Appreciate palliative care note Continue tube feeds until current tube comes out or quits functioning or until patient transferred to inpatient hospice FIM: FIM - Bathing Bathing Steps Patient Completed: Chest, Abdomen, Front perineal area, Right upper leg, Left upper leg, Right lower leg (including foot), Left lower leg (including foot) Bathing: 3: Mod-Patient completes 5-7 [id number] 10 parts or 50-74%  FIM - Upper Body Dressing/Undressing Upper body dressing/undressing steps patient completed: Put head through opening of pull over shirt/dress Upper body dressing/undressing: 2: Max-Patient completed 25-49% of tasks FIM - Lower Body Dressing/Undressing Lower body dressing/undressing steps patient completed: Thread/unthread right pants leg Lower body dressing/undressing: 1: Two helpers (at sit > stand)  FIM - Toileting Toileting: 0: No continent bowel/bladder events this shift  FIM - ArchivistToilet Transfers Toilet Transfers: 0-Activity did not occur  FIM - BankerBed/Chair Transfer Bed/Chair Transfer Assistive Devices: Bed rails, Arm rests Bed/Chair Transfer: 3: Supine > Sit: Mod A (lifting assist/Pt. 50-74%/lift 2 legs, 1: Two helpers  FIM - Locomotion: Wheelchair Distance: 10 Locomotion:  Wheelchair: 1: Travels less than 50 ft with maximal assistance (Pt: 25 - 49%) FIM - Locomotion: Ambulation Locomotion: Ambulation Assistive Devices: Other (comment) (R HHA and therapist at pt's L side) Ambulation/Gait  Assistance: 1: +2 Total assist Locomotion: Ambulation: 1: Two helpers  Comprehension Comprehension Mode: Auditory Comprehension: 3-Understands basic 50 - 74% of the time/requires cueing 25 - 50%  of the time  Expression Expression Mode: Verbal Expression: 3-Expresses basic 50 - 74% of the time/requires cueing 25 - 50% of the time. Needs to repeat parts of sentences.  Social Interaction Social Interaction: 2-Interacts appropriately 25 - 49% of time - Needs frequent redirection.  Problem Solving Problem Solving: 2-Solves basic 25 - 49% of the time - needs direction more than half the time to initiate, plan or complete simple activities  Memory Memory: 1-Recognizes or recalls less than 25% of the time/requires cueing greater than 75% of the time  Medical Problem List and Plan: 1. Functional deficits secondary to right MCA infarct with dense left hemiparesis and visual-spatial deficits 2.  DVT Prophylaxis/Anticoagulation: Subcutaneous Lovenox. Monitor platelet counts and any signs of bleeding 3. Pain Management: Ultram as needed. 4. Mood/depression/dementia. Aricept 10 mg daily at bedtime, Effexor 75 mg daily. Discussed baseline cognition with family 5. Neuropsych: This patient is not capable of making decisions on her own behalf. 6. Skin/Wound Care: Routine skin checks as well as routine turning to maintain skin integrity 7. Fluids/Electrolytes/Nutrition: Follow-up chemistries. Strict I and O's.Hx CKD, monitor renal status 8. Dysphagia. Severe NPO Nasogastric tube feeds. Follow-up speech therapy. IV fluids to maintain hydration  swallowing recovery prognosis is guarded, very long duration at best 9. Hypothyroidism. Synthroid. Latest TSH level 2.929 10. GERD. Protonix 11. Hx CAD, ASA   12.  Mild hypokalemia LOS (Days) 10 A FACE TO FACE EVALUATION WAS PERFORMED  Maeve Debord E 02/11/2014, 8:08 AM

## 2014-02-12 ENCOUNTER — Inpatient Hospital Stay (HOSPITAL_COMMUNITY): Payer: Medicare Other | Admitting: Occupational Therapy

## 2014-02-12 NOTE — Progress Notes (Signed)
Occupational Therapy Session Note  Patient Details  Name: Sarah Daniel MRN: [id number] Date of Birth: [birth year]  Today's Date: 02/12/2014 OT Individual Time: [id number] OT Individual Time Calculation (min): 30 min    Short Term Goals: Week 2:  OT Short Term Goal 1 (Week 2): Pt will perform grooming with min A at sink in order to increase participation with self care. OT Short Term Goal 2 (Week 2): Pt will perform UB dressing with Mod A  utilizing modifcations as needed in order to increase participation with self care. OT Short Term Goal 3 (Week 2): Pt will locate items to the Lt during ADL tasks with min verbal cues during bathing and dressing session.  OT Short Term Goal 4 (Week 2): Pt will complete toilet transfer with mod assist OT Short Term Goal 5 (Week 2): Pt will complete LB dressing with max assist of 1 caregiver   Skilled Therapeutic Interventions/Progress Updates:    Pt in bed upon arrival, reporting discomfort in back.  Engaged in rolling in bed for perineal hygiene, as pt incontinent of bowel, and repositioning to decrease back pain.  Pt required hand over hand assist to promote reaching for bed rails to assist with rolling, +2 to maintain pt in sidelying while this therapist completed hygiene.  Pt able to bridge hips and lift buttocks to assist in LB dressing while therapist stabilized her feet and pulled pants over hips.  Mod assist supine to sit with focus on turning head in direction of movement to also improve head control and midline orientation.  Squat pivot +2 to w/c with pt initiating forward weight shift.  Utilized mirror at sink to encourage midline orientation and head turn to locate self in mirror to complete hair brushing.  Pt with improved postural control in reclining w/c and head alignment upon end of session.  Therapy Documentation Precautions:  Precautions Precautions: Fall Precaution Comments: Severe L inattention Restrictions Weight Bearing Restrictions:  No Pain:  Pt with c/o pain in back, repositioned, RN notified  See FIM for current functional status  Therapy/Group: Individual Therapy  Rosalio LoudHOXIE, Jery Hollern 02/12/2014, 10:56 AM

## 2014-02-12 NOTE — Progress Notes (Signed)
Patient ID: Taralee [place], female   DOB: 03/10/1922, 80 y.o.   MRN: [id number]   Patient ID: Tephanie [place], female   DOB: 03/10/1922, 80 y.o.   MRN: [id number]  Patient ID: Shawniece [place], female   DOB: 03/10/1922, 80 y.o.   MRN: [id number]  Patient ID: Marysa [place], female   DOB: 03/10/1922, 80 y.o.   MRN: [id number]   02/12/14.  80 y.o. right handed female with history of dementia maintained on Aricept, coronary artery disease, hypertension. Presented 01/29/2014 after being found down by her daughter with left-sided weakness and facial droop.  Neurology service  consulted maintained on aspirin   Subjective/Complaints:  Review of Systems - cannot obtain secondary to poor awareness  Past Medical History  Diagnosis Date  . Dementia   . Hypertension   . GERD (gastroesophageal reflux disease)   . Thyroid disease   . PUD (peptic ulcer disease)   . Coronary artery disease   . Hypothyroidism   . Arthritis     No intake or output data in the 24 hours ending 02/12/14 1022  Patient Vitals for the past 24 hrs:  BP Temp Temp src Pulse Resp SpO2 Weight  02/12/14 0553 99/73 mmHg 98.7 F (37.1 C) Oral 88 18 98 % 97 lb 3.6 oz (44.1 kg)  02/11/14 1327 (!) 164/80 mmHg 98.2 F (36.8 C) Axillary 85 17 97 % -      Objective: Vital Signs: Blood pressure 99/73, pulse 88, temperature 98.7 F (37.1 C), temperature source Oral, resp. rate 18, weight 97 lb 3.6 oz (44.1 kg), SpO2 98 %. No results found. No results found for this or any previous visit (from the past 72 hour(s)).   Gen- frail somnolent this am HEENT: Left facial droop Cardio: RRR and no murmur Resp: CTA B/L and unlabored GI: BS positive and NT, ND; NGT Extremity:  No Edema Skin:  Neg  Neuro: depressed?,  Cranial Nerve Abnormalities Left central VII, Abnormal Sensory Left hemisensory def, Abnormal Motor 2- biceps, shoulder add, 2- L HFMusc/Skel:  Other no pain with A/PROM of UE and LE NAD,R gaze pref   Assessment/Plan: 1.  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  DVT Prophylaxis/Anticoagulation: Subcutaneous Lovenox. Monitor platelet counts and any signs of bleeding 3. Pain Management: Ultram as needed. 4. Mood/depression/dementia. Aricept 10 mg daily at bedtime, Effexor 37.5 mg daily. Discussed difficulty with evaluation of depression in setting of large CVA       LOS (Days) 11 A FACE TO FACE EVALUATION WAS PERFORMED  Rogelia BogaKWIATKOWSKI,Teancum Brule FRANK 02/12/2014, 10:22 AM

## 2014-02-12 NOTE — Plan of Care (Signed)
Problem: RH BOWEL ELIMINATION Goal: RH STG MANAGE BOWEL WITH ASSISTANCE STG Manage Bowel with max Assistance.  Outcome: Not Progressing Patient npo

## 2014-02-12 NOTE — Plan of Care (Signed)
Problem: RH BOWEL ELIMINATION Goal: RH STG MANAGE BOWEL WITH ASSISTANCE STG Manage Bowel with max Assistance.  Outcome: Progressing Goal: RH STG MANAGE BOWEL W/MEDICATION W/ASSISTANCE STG Manage Bowel with Medication with mod Assistance.  Outcome: Progressing  Problem: RH BLADDER ELIMINATION Goal: RH STG MANAGE BLADDER WITH ASSISTANCE STG Manage Bladder With max Assistance  Outcome: Progressing Goal: RH STG MANAGE BLADDER WITH MEDICATION WITH ASSISTANCE STG Manage Bladder With Medication With max Assistance.  Outcome: Progressing Goal: RH STG MANAGE BLADDER WITH EQUIPMENT WITH ASSISTANCE STG Manage Bladder With Equipment With min Assistance  Outcome: Progressing  Problem: RH SKIN INTEGRITY Goal: RH STG SKIN FREE OF INFECTION/BREAKDOWN Max Assist Outcome: Progressing Goal: RH STG MAINTAIN SKIN INTEGRITY WITH ASSISTANCE STG Maintain Skin Integrity With Max Assistance.  Outcome: Progressing  Problem: RH SAFETY Goal: RH STG ADHERE TO SAFETY PRECAUTIONS W/ASSISTANCE/DEVICE STG Adhere to Safety Precautions With Mod Assistance/Device.  Outcome: Progressing Goal: RH STG DECREASED RISK OF FALL WITH ASSISTANCE STG Decreased Risk of Fall With [employer] CorporationMod Assistance.  Outcome: Progressing Goal: RH STG DEMO UNDERSTANDING HOME SAFETY PRECAUTIONS Mod Assist  Outcome: Progressing  Problem: RH PAIN MANAGEMENT Goal: RH STG PAIN MANAGED AT OR BELOW PT'S PAIN GOAL <2  Outcome: Progressing

## 2014-02-13 ENCOUNTER — Inpatient Hospital Stay (HOSPITAL_COMMUNITY): Payer: Medicare Other

## 2014-02-13 NOTE — Progress Notes (Signed)
Patient ID: Demira [place], female   DOB: 10-Jan-1922, 80 y.o.   MRN: [id number]  Patient ID: Katoria [place], female   DOB: 10-Jan-1922, 80 y.o.   MRN: [id number]   Patient ID: Marbella [place], female   DOB: 10-Jan-1922, 80 y.o.   MRN: [id number]  Patient ID: Neva [place], female   DOB: 10-Jan-1922, 80 y.o.   MRN: [id number]  Patient ID: Mariene [place], female   DOB: 10-Jan-1922, 80 y.o.   MRN: [id number]   02/13/14.  80 y.o. right handed female with history of dementia maintained on Aricept, coronary artery disease, hypertension. Presented 01/29/2014 after being found down by her daughter with left-sided weakness and facial droop.  Neurology service  consulted;  maintained on aspirin   Subjective/Complaints:  Review of Systems - cannot obtain secondary to poor awareness  Past Medical History  Diagnosis Date  . Dementia   . Hypertension   . GERD (gastroesophageal reflux disease)   . Thyroid disease   . PUD (peptic ulcer disease)   . Coronary artery disease   . Hypothyroidism   . Arthritis     No intake or output data in the 24 hours ending 02/13/14 0919  Patient Vitals for the past 24 hrs:  BP Temp Temp src Pulse Resp SpO2 Weight  02/13/14 0604 137/62 mmHg 97.7 F (36.5 C) Oral 66 17 97 % -  02/13/14 0500 - - - - - - 91 lb 7.9 oz (41.5 kg)  02/12/14 1443 113/67 mmHg 99.1 F (37.3 C) Oral 86 17 98 % -      Objective: Vital Signs: Blood pressure 137/62, pulse 66, temperature 97.7 F (36.5 C), temperature source Oral, resp. rate 17, weight 91 lb 7.9 oz (41.5 kg), SpO2 97 %. No results found. No results found for this or any previous visit (from the past 72 hour(s)).   Gen- frail somnolent this am HEENT: Left facial droop Cardio: RRR and no murmur Resp: CTA B/L and unlabored GI: BS positive and NT, ND; NGT Extremity:  No Edema Skin:  Neg  Neuro: depressed?,  Cranial Nerve Abnormalities Left central VII, Abnormal Sensory Left hemisensory def, Abnormal Motor 2- biceps, shoulder add, 2- L  HFMusc/Skel:  Other no pain with A/PROM of UE and LE NAD,R gaze pref; nods head inconsistently to questions; no verbal response   Assessment/Plan: 1. 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  DVT Prophylaxis/Anticoagulation: Subcutaneous Lovenox. Monitor platelet counts and any signs of bleeding 3. Pain Management: Ultram as needed. 4. Mood/depression/dementia. Aricept 10 mg daily at bedtime, Effexor 37.5 mg daily. Discussed difficulty with evaluation of depression in setting of large CVA       LOS (Days) 12 A FACE TO FACE EVALUATION WAS PERFORMED  Rogelia BogaKWIATKOWSKI,PETER FRANK 02/13/2014, 9:19 AM

## 2014-02-13 NOTE — Progress Notes (Addendum)
Physical Therapy Session Note  Patient Details  Name: Sarah Daniel MRN: [id number] Date of Birth: 09-04-1921  Today's Date: 02/13/2014 PT Individual Time: 1400-1430 PT Individual Time Calculation (min): 30 min   Short Term Goals: Week 2:  PT Short Term Goal 1 (Week 2): Pt will perform supine<>sit with mod A with HOB elevated 30 degrees using bed rail. PT Short Term Goal 2 (Week 2): Pt will perform bed<>w/c transfers with min A, 50% of the time. PT Short Term Goal 3 (Week 2): Pt will perform w/c mobility x100' with mod A and 50% cueing for attention to L visual field. PT Short Term Goal 4 (Week 2): Pt will ambulate x100' with mod A of one person (and w/c follow for safety, if needed). PT Short Term Goal 5 (Week 2): Pt will track head/eyes to midline to midline with mod cueing, 50% of the time.  Skilled Therapeutic Interventions/Progress Updates:    Pt received supine in bed, agreeable to participate in therapy. Session focused on R attention, seated balance, trunk control. Pt moved supine>sit w/ HOB elevated to L side of bed w/ MaxA. Pt reaching R hand for rail on R side despite VC's to reach for therapist. Pt tolerated sitting EOB for 10 minutes, Min-TotalA to remain seated. Max VC's to reach R hand towards foot of bed to remediate falling to L side. Moved sit>supine w/ ModA. Provided passive stretching for cervical rotation towards L. Pt able to actively rotate neck just past midline to L w/ max multimodal cueing. Also provided gentle massage to suboccipital area for pain relief. Pt left supine in bed w/ pillows under R side and all needs within reach w/ bed alarm on.   Therapy Documentation Precautions:  Precautions Precautions: Fall Precaution Comments: Severe L inattention Restrictions Weight Bearing Restrictions: No General:   Vital Signs: Therapy Vitals Temp: 97.7 F (36.5 C) Temp Source: Oral Pulse Rate: 66 Resp: 17 BP: 137/62 mmHg Patient Position (if appropriate):  Lying Oxygen Therapy SpO2: 97 % O2 Device: Not Delivered Pain:  "Crink" in neck, not rated. Provided stretching and gentle massage. Mobility:   Locomotion :    Trunk/Postural Assessment :    Balance:   Exercises:   Other Treatments:    See FIM for current functional status  Therapy/Group: Individual Therapy  Sarah Daniel, Sarah Daniel  Sarah Daniel, PT, DPT 02/13/2014, 7:49 AM

## 2014-02-13 NOTE — Plan of Care (Signed)
Problem: RH SAFETY Goal: RH STG ADHERE TO SAFETY PRECAUTIONS W/ASSISTANCE/DEVICE STG Adhere to Safety Precautions With Mod Assistance/Device.  Outcome: Progressing

## 2014-02-14 ENCOUNTER — Inpatient Hospital Stay (HOSPITAL_COMMUNITY): Payer: Medicare Other | Admitting: Speech Pathology

## 2014-02-14 ENCOUNTER — Inpatient Hospital Stay (HOSPITAL_COMMUNITY): Payer: Medicare Other | Admitting: Occupational Therapy

## 2014-02-14 ENCOUNTER — Inpatient Hospital Stay (HOSPITAL_COMMUNITY): Payer: Medicare Other | Admitting: Physical Therapy

## 2014-02-14 DIAGNOSIS — F09 Unspecified mental disorder due to known physiological condition: Secondary | ICD-10-CM

## 2014-02-14 DIAGNOSIS — F0390 Unspecified dementia without behavioral disturbance: Secondary | ICD-10-CM

## 2014-02-14 DIAGNOSIS — R414 Neurologic neglect syndrome: Secondary | ICD-10-CM

## 2014-02-14 DIAGNOSIS — I63511 Cerebral infarction due to unspecified occlusion or stenosis of right middle cerebral artery: Secondary | ICD-10-CM

## 2014-02-14 MED ORDER — BISACODYL 10 MG RE SUPP: 10.0000 mg | Freq: Every day | RECTAL | Status: DC | PRN

## 2014-02-14 NOTE — Progress Notes (Signed)
Speech Language Pathology Daily Session Note  Patient Details  Name: Sarah [place] MRN: [id number] Date of Birth: Apr 28, 1921  Today's Date: 02/14/2014 SLP Individual Time: 1003-1050 SLP Individual Time Calculation (min): 47 min  Short Term Goals: Week 2: SLP Short Term Goal 1 (Week 2): Pt will demonstrate an increased swallow initiation with Mod A multimodal cues to utilize swallowing compensatory strategies. SLP Short Term Goal 2 (Week 2): Pt will demonstrate increased sustained attention with Mod A multimodal cues. SLP Short Term Goal 3 (Week 2): Pt will utilize external memory aids to increase orientation with Max A multimodal cues. SLP Short Term Goal 4 (Week 2): Pt will demonstrate functional problem solving for basic and familiar tasks with Max A multimodal cues.  SLP Short Term Goal 5 (Week 2): Pt will use call bell to request assistance with Max A multimodal cues. SLP Short Term Goal 6 (Week 2): Pt will utilize over articulation and increased vocal intensity to increase speech intelligibility with Max A multimodal cues.   Skilled Therapeutic Interventions:  Pt was seen for skilled ST targeting family education.  Upon arrival, pt was upright in bed, awake, alert, and pleasantly interactive.  Pt's daughters were both present for the majority of today's therapy session.  Per report, pt's daughters have decided to pursue hospice and are [facility].  SLP provided skilled education related to comfort feeds, pt's risk of aspiration, and results of most recent MBS.  Pt declined comfort feedings of pureed consistencies during today's session.  Pt's daughters requested that pt continue to be seen for therapies as tolerated while inpatient.  Spoke with PA and requested order for QD therapies.  Throughout session, pt remained, awake, alert, and engaged in personally meaningful conversation with SLP for multiple turns with supervision cues.     FIM:  Comprehension Comprehension Mode:  Auditory Comprehension: 3-Understands basic 50 - 74% of the time/requires cueing 25 - 50%  of the time Expression Expression Mode: Verbal Expression: 4-Expresses basic 75 - 89% of the time/requires cueing 10 - 24% of the time. Needs helper to occlude trach/needs to repeat words. Social Interaction Social Interaction: 2-Interacts appropriately 25 - 49% of time - Needs frequent redirection. Problem Solving Problem Solving: 2-Solves basic 25 - 49% of the time - needs direction more than half the time to initiate, plan or complete simple activities Memory Memory: 2-Recognizes or recalls 25 - 49% of the time/requires cueing 51 - 75% of the time  Pain Pain Assessment Pain Assessment: Faces Faces Pain Scale: Hurts little more Pain Type: Acute pain Pain Location: Neck Pain Orientation: Upper;Mid;Posterior Pain Descriptors / Indicators: Aching Pain Onset: Gradual Pain Intervention(s): RN made aware;Repositioned Multiple Pain Sites: No  Therapy/Group: Individual Therapy   Jackalyn LombardNicole Lexander Tremblay, M.A. CCC-SLP  Ebenezer Mccaskey, Melanee SpryNicole L 02/14/2014, 2:24 PM

## 2014-02-14 NOTE — Progress Notes (Signed)
Social Work Patient ID: Sarah Daniel, female   DOB: 12/04/1921, 80 y.o.   MRN: [id number] Met with daughter's to inform Sarah Daniel will be contacting them to discuss services.  Will await family getting in touch with Sarah Daniel to make arrangements for the transfer.

## 2014-02-14 NOTE — Progress Notes (Signed)
1540 discussed loss of IV access with Yong ChannelAlicia Parker, NP. At this time she reccommended holding insertion of IV and IV therapy.   Harvel Ricksan Anguilli made aware.   Family was present in the room; aware and agreed with decision.   Will continue to monitor.   Edgardo RoysMcGrath, Khush Pasion R

## 2014-02-14 NOTE — Progress Notes (Signed)
Progress Note from the Palliative Medicine Team at [facility]: I met today with Sarah Daniel's daughters, Sarah Daniel and Sarah Daniel, regarding further planning for goals of care and focus on comfort. They both agree to move forward with NO PEG and hospice to ensure comfort. They both agree that Sarah Daniel will not have a quality of life that she would be satisfied with someone bathing her and total care needs. Sarah Daniel was very social, active, and independent prior to stroke. Discussed hospice care and philosophy and daughters believe this is the best route for their mother. Discussed natural progression and process of dying with daughters as well. They have good understanding and seem to be at peace with their decisions and realize that this is what their mother would want for herself and they can respect her wishes. Without artifical feeding Sarah Daniel's prognosis is days to a couple weeks at best. They have asked me to come by later to briefly and simply explain this transition to Sarah Daniel.    Objective: No Known Allergies Scheduled Meds: . aspirin  300 mg Rectal Daily  . chlorhexidine  15 mL Mouth Rinse BID  . enoxaparin (LOVENOX) injection  30 mg Subcutaneous Q24H  . levothyroxine  25 mcg Intravenous QAC breakfast  . pantoprazole (PROTONIX) IV  40 mg Intravenous Q24H   Continuous Infusions: . sodium chloride Stopped (02/14/14 0724)   PRN Meds:.acetaminophen, MUSCLE RUB, [DISCONTINUED] ondansetron **OR** ondansetron (ZOFRAN) IV, sorbitol  BP 146/79 mmHg  Pulse 77  Temp(Src) 98.8 F (37.1 C) (Oral)  Resp 17  Wt 41.9 kg (92 lb 6 oz)  SpO2 100%   PPS: 20%   Intake/Output Summary (Last 24 hours) at 02/14/14 0930 Last data filed at 02/14/14 0500  Gross per 24 hour  Intake    720 ml  Output      0 ml  Net    720 ml      LBM: 11/8  Physical Exam:  General: NAD, elderly, thin, frail HEENT: [place]/AT, no JVD, moist mucous membranes Chest: CTA throughout, no labored breathing,  symmetric CVS: RRR, S1 S2 Abdomen: Soft, NT, ND, +BS Ext: No edema, warm to touch Neuro: Lethargic, soft voice, oriented to person   Labs: CBC    Component Value Date/Time   WBC 8.0 02/02/2014 0726   RBC 3.67* 02/02/2014 0726   HGB 10.7* 02/02/2014 0726   HCT 33.4* 02/02/2014 0726   PLT  02/02/2014 0726    PLATELET CLUMPS NOTED ON SMEAR, COUNT APPEARS ADEQUATE   MCV 91.0 02/02/2014 0726   MCH 29.2 02/02/2014 0726   MCHC 32.0 02/02/2014 0726   RDW 15.1 02/02/2014 0726   LYMPHSABS 0.9 02/02/2014 0726   MONOABS 0.7 02/02/2014 0726   EOSABS 0.0 02/02/2014 0726   BASOSABS 0.0 02/02/2014 0726    BMET    Component Value Date/Time   NA 137 02/02/2014 0726   K 3.3* 02/02/2014 0726   CL 99 02/02/2014 0726   CO2 19 02/02/2014 0726   GLUCOSE 98 02/02/2014 0726   BUN 13 02/02/2014 0726   CREATININE 0.59 02/08/2014 0712   CALCIUM 8.6 02/02/2014 0726   GFRNONAA 78* 02/08/2014 0712   GFRAA >90 02/08/2014 0712    CMP     Component Value Date/Time   NA 137 02/02/2014 0726   K 3.3* 02/02/2014 0726   CL 99 02/02/2014 0726   CO2 19 02/02/2014 0726   GLUCOSE 98 02/02/2014 0726   BUN 13 02/02/2014 0726   CREATININE  0.59 02/08/2014 0712   CALCIUM 8.6 02/02/2014 0726   PROT 6.6 02/02/2014 0726   ALBUMIN 3.2* 02/02/2014 0726   AST 31 02/02/2014 0726   ALT 25 02/02/2014 0726   ALKPHOS 78 02/02/2014 0726   BILITOT 0.5 02/02/2014 0726   GFRNONAA 78* 02/08/2014 0712   GFRAA >90 02/08/2014 0712    Assessment and Plan: 1. Code Status: DNR 2. Symptom Control:  1. Bowel Regimen: Dulcolax supp daily prn.   2. Nausea/Vomiting: Ondansetron prn.  3. Dysphagia: No PEG. Comfort feeds - would not encourage large amounts of food intake but may offer foods she likes in small bites periodically for comfort. 4. Neck pain/lower back pain: Offered Lidoderm patch but Sarah Daniel says Sarah Daniel did not like this in the past. Continue using muscle rub cream and acetaminophen prn. Consider low dose  oxyfast for increased pain or even low dose muscle relaxer qhs prn.  3. Psycho/Social: Emotional support provided to patient and family during difficult conversation and decisions.  4. Disposition: Hopeful for hospice facility.     Time In Time Out Total Time Spent with Patient Total Overall Time  0820 0910 70mn 532m    Greater than 50%  of this time was spent counseling and coordinating care related to the above assessment and plan.  AlVinie SillNP Palliative Medicine Team Pager # [phone]-F 8a-5p) Team Phone # [phone]/Weekends)

## 2014-02-14 NOTE — Consult Note (Signed)
[facility] Liaison: Received request from CSW [place]y for [facility]. Chart reviewed and attempted to meet with family. Spoke with daughter Geraldine ContrasDee by phone. At that time she was requesting followup with Elease HashimotoAlisha with Palliative Medicine Team. Rella LarveMade Alisha aware of request. [facility] after followup with Aslisha. At that time [facility] ready [facility] tomorrow. [facility] tomorrow morning to complete transfer paper work. Unable to confirm time due to Dee's voice message system full and unable to leave message. Bedside RN aware of above and encouraged to have [facility] me if she calls unit this evening. Once paper work complete, will need DC summary faxed to [phone] and RN to call report to [phone]. Thank you. Forrestine Himva Marni Franzoni LCSW [phone]

## 2014-02-14 NOTE — Progress Notes (Signed)
Occupational Therapy Session Note  Patient Details  Name: Sarah Daniel MRN: [id number] Date of Birth: 04-Apr-1922  Today's Date: 02/14/2014 OT Individual Time: 4098-11910730-0830 OT Individual Time Calculation (min): 60 min    Short Term Goals: Week 2:  OT Short Term Goal 1 (Week 2): Pt will perform grooming with min A at sink in order to increase participation with self care. OT Short Term Goal 2 (Week 2): Pt will perform UB dressing with Mod A  utilizing modifcations as needed in order to increase participation with self care. OT Short Term Goal 3 (Week 2): Pt will locate items to the Lt during ADL tasks with min verbal cues during bathing and dressing session.  OT Short Term Goal 4 (Week 2): Pt will complete toilet transfer with mod assist OT Short Term Goal 5 (Week 2): Pt will complete LB dressing with max assist of 1 caregiver   Skilled Therapeutic Interventions/Progress Updates:    Engaged in ADL retraining with focus on initiation, Lt attention, and midline head positioning.  Pt in bed upon arrival willing to participate in treatment session.  Pt required mod verbal cues and manual facilitation and stretching to promote midline head placement in bed to increase participation in LB bathing and perineal hygiene.  Engaged in bed mobility to doff wet brief and don new one.  Pt with increased participation with rolling and weight shift, utilizing bridging to assist therapist in pulling pants over hips.  UB dressing completed at sink with use of mirror for visual feedback to promote midline sitting balance and head positioning.  Pt with increased initiation with supine to sit and squat pivot transfer requiring assist of only one caregiver.  Oral care completed with use of suction with pt controlling brush while therapist controlled suction to maintain NPO.  Pt with increased initiation with all mobility and following directions.  Much more conversational with this therapist and daughters upon their  arrival.    Therapy Documentation Precautions:  Precautions Precautions: Fall Precaution Comments: Severe L inattention Restrictions Weight Bearing Restrictions: No Pain: Pain Assessment Pain Assessment: Faces Faces Pain Scale: Hurts little more Pain Type: Chronic pain Pain Location: Head Pain Descriptors / Indicators: Aching Pain Intervention(s): Medication (See eMAR)  See FIM for current functional status  Therapy/Group: Individual Therapy  Rosalio LoudHOXIE, Katriona Schmierer 02/14/2014, 10:43 AM

## 2014-02-14 NOTE — Progress Notes (Signed)
Social Work Patient ID: Sarah Daniel, female   DOB: Jun 21, 1921, 80 y.o.   MRN: [id number] Met with pt and two daughters who have met this am with [facility]-Palliative care and have decided to pursue [facility]. Have given them the choice of hospices and they have chosen [facility].  Have made the referral and await response.

## 2014-02-14 NOTE — Progress Notes (Signed)
NUTRITION FOLLOW UP  INTERVENTION: Agree with continuation of feeds for comfort.  NUTRITION DIAGNOSIS: Inadequate oral intake related to inability to eat as evidenced by NPO status; ongoing  Goal: Pt to meet >/= 90% of their estimated nutrition needs; unmet  Monitor:  weight trends, labs, I/O's  80 y.o. female  Admitting Dx: CVA  ASSESSMENT: Pt with history of dementia, coronary artery disease, hypertension. By report patient lives with her daughter independent prior to admission and assistance as needed. Presented 01/29/2014 after being found down by her daughter with left-sided weakness and facial droop. MRI/MRA of the brain shows acute nonhemorrhagic anterior right MCA territory infarct as well as occlusion of the right internal carotid artery with partial reconstitution of the cavernous and ophthalmic segments.  Family plan for to move forward with no PEG. Pt now on feeds for comfort. No nutrition intervention at this time.   Height: Ht Readings from Last 1 Encounters:  01/30/14 4\' 9"  (1.448 m)    Weight: Wt Readings from Last 1 Encounters:  02/14/14 92 lb 6 oz (41.9 kg)  02/02/14 87 lbs  BMI:  Body mass index is 19.98 kg/(m^2).  Re-Estimated Nutritional Needs: Kcal: 1300-1500 Protein: 55-65 grams  Fluid: >/=1.5 L/day  Skin: non-pitting LUE edema  Diet Order: Diet NPO time specified    Intake/Output Summary (Last 24 hours) at 02/14/14 1435 Last data filed at 02/14/14 0500  Gross per 24 hour  Intake    720 ml  Output      0 ml  Net    720 ml    Last BM: 11/9  Labs:   Recent Labs Lab 02/08/14 0712  CREATININE 0.59    CBG (last 3)  No results for input(s): GLUCAP in the last 72 hours.  Scheduled Meds: . aspirin  300 mg Rectal Daily  . chlorhexidine  15 mL Mouth Rinse BID  . enoxaparin (LOVENOX) injection  30 mg Subcutaneous Q24H  . levothyroxine  25 mcg Intravenous QAC breakfast  . pantoprazole (PROTONIX) IV  40 mg Intravenous Q24H     Continuous Infusions: . sodium chloride Stopped (02/14/14 0724)    Past Medical History  Diagnosis Date  . Dementia   . Hypertension   . GERD (gastroesophageal reflux disease)   . Thyroid disease   . PUD (peptic ulcer disease)   . Coronary artery disease   . Hypothyroidism   . Arthritis     Past Surgical History  Procedure Laterality Date  . Abdominal hysterectomy      Marijean NiemannStephanie La, MS, RD, LDN Pager # [phone] After hours/ weekend pager # [phone]

## 2014-02-14 NOTE — Plan of Care (Signed)
Problem: RH BOWEL ELIMINATION Goal: RH STG MANAGE BOWEL WITH ASSISTANCE STG Manage Bowel with max Assistance.  Outcome: Not Progressing  Problem: RH BLADDER ELIMINATION Goal: RH STG MANAGE BLADDER WITH ASSISTANCE STG Manage Bladder With max Assistance  Outcome: Not Progressing  Problem: RH SKIN INTEGRITY Goal: RH STG SKIN FREE OF INFECTION/BREAKDOWN Max Assist Outcome: Progressing Goal: RH STG MAINTAIN SKIN INTEGRITY WITH ASSISTANCE STG Maintain Skin Integrity With Max Assistance.  Outcome: Progressing

## 2014-02-14 NOTE — Progress Notes (Addendum)
Physical Therapy Session Note  Patient Details  Name: Sarah Daniel MRN: [id number] Date of Birth: February 01, 1922  Today's Date: 02/14/2014 PT Individual Time: 1100-1145 PT Individual Time Calculation (min): 45 min   Short Term Goals: Week 2:  PT Short Term Goal 1 (Week 2): Pt will perform supine<>sit with mod A with HOB elevated 30 degrees using bed rail. PT Short Term Goal 2 (Week 2): Pt will perform bed<>w/c transfers with min A, 50% of the time. PT Short Term Goal 3 (Week 2): Pt will perform w/c mobility x100' with mod A and 50% cueing for attention to L visual field. PT Short Term Goal 4 (Week 2): Pt will ambulate x100' with mod A of one person (and w/c follow for safety, if needed). PT Short Term Goal 5 (Week 2): Pt will track head/eyes to midline to midline with mod cueing, 50% of the time.  Skilled Therapeutic Interventions/Progress Updates:    2:1. Pt received reclined in w/c with quick release belt in place for safety. Pt reporting pain in upper/mid aspect of posterior neck. Noted tenderness to palpation at suboccipital muscles, R sternocleidomastoid, R levator scapulae, and R anterior and middle scalene muscles. Therefore, applied moist heat to posterior/right aspect of cervical spine (focus on suboccipital region); checked skin frequently for excessive erythema. Performed gentle stretching x3 minutes per muscle group (x15 minutes total). Performed gait x60' in controlled environment with +2A for R HHA and PT positioned at pt's L side providing manual facilitation at L ribcage for thoracic extension, at posterior pelvis for bilat hip extension. Per initiation of pt, negotiated 2 stairs with R rail, forward-facing to ascend and backwards to descend with step-to pattern and max A.   Pt expressing significant fatigue at this time. Therefore, transported pt back to room with total A. Per request of RN to leave pt in bed, performed lateral scooting transfer (to R side) Addendum: with max A, manual  facilitation of anterior weight shift. Pt performed sit>supine with mod A for bilat LE management with HOB flat. Departed with pt semi reclined in bed with 3 rails up, bed alarm on, and all needs within reach.  Therapy Documentation Precautions:  Precautions Precautions: Fall Precaution Comments: Severe L inattention Restrictions Weight Bearing Restrictions: No Pain: Pain Assessment Pain Assessment: Faces Faces Pain Scale: Hurts little more Pain Type: Acute pain Pain Location: Neck Pain Orientation: Upper;Mid;Posterior Pain Descriptors / Indicators: Aching Pain Onset: Gradual Pain Intervention(s): Repositioned;Heat applied;Other (Comment);Massage (gentle stretching) Multiple Pain Sites: No Locomotion : Ambulation Ambulation/Gait Assistance: 1: +2 Total assist Wheelchair Mobility Distance: 15   See FIM for current functional status  Therapy/Group: Individual Therapy  Jazzman Loughmiller, Lorenda IshiharaBlair A 02/14/2014, 11:56 AM

## 2014-02-14 NOTE — Progress Notes (Signed)
Patient ID: Sarah Daniel, female   DOB: [birth year], 80 y.o.   MRN: [id number]  [age] y.o. right handed female with history of dementia maintained on Aricept, coronary artery disease, hypertension. By report patient lives with her daughter independent prior to admission and assistance as needed. Presented 01/29/2014 after being found down by her daughter with left-sided weakness and facial droop. Patient was a poor medical historian. MRI/MRA of the brain shows acute nonhemorrhagic anterior right MCA territory infarct as well as occlusion of the right internal carotid artery with partial reconstitution of the cavernous and ophthalmic segments. CT angiogram of the neck again showing right internal carotid artery occlusion as well as proximal left subclavian artery occlusion less than 10 mm from the aortic arch. Patient did not receive TPA. Echocardiogram with ejection fraction of 60% grade 1 diastolic dysfunction. Subjective/Complaints: NG tube has come out and not replaced Receives IVF Comfort feeds limited by level of alertness Review of Systems - cannot obtain secondary to poor awareness     Objective: Vital Signs: Blood pressure 146/79, pulse 77, temperature 98.8 F (37.1 C), temperature source Oral, resp. rate 17, weight 41.9 kg (92 lb 6 oz), SpO2 100 %. No results found. No results found for this or any previous visit (from the past 72 hour(s)).   HEENT: Left facial droop Cardio: RRR and no murmur Resp: CTA B/L and unlabored GI: BS positive and NT, ND Extremity:  No Edema Skin:   Bruise LUE and LLE Neuro: Confused, Flat, Cranial Nerve Abnormalities Left central VII, Abnormal Sensory Left hemisensory def, no AROM noted LUE this am shoulder add, 2- L HF, KE, synergy pattern  0/5 Left ankle, Dysarthric and Inattention Musc/Skel:  Other no pain with A/PROM of UE and LE NAD, severe R gaze pref Sparse verbal output- "good morming"  Poor sitting posture  Assessment/Plan: 1. 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 Physiatrist is providing close team supervision and 24 hour management of active medical problems listed below. Physiatrist and rehab team continue to assess barriers to discharge/monitor patient progress toward functional and medical goals. Appreciate palliative care note Will do comfort feeds when alert enough but do not think pt will get enough intake based on fluctuating level of alertness FIM: FIM - Bathing Bathing Steps Patient Completed: Chest, Left Arm, Abdomen, Front perineal area, Right upper leg, Left upper leg, Right lower leg (including foot), Left lower leg (including foot) Bathing: 4: Min-Patient completes 8-9 2437f 10 parts or 75+ percent (at bed level)  FIM - Upper Body Dressing/Undressing Upper body dressing/undressing steps patient completed: Put head through opening of pull over shirt/dress Upper body dressing/undressing: 2: Max-Patient completed 25-49% of tasks FIM - Lower Body Dressing/Undressing Lower body dressing/undressing steps patient completed: Thread/unthread right pants leg Lower body dressing/undressing: 1: Total-Patient completed less than 25% of tasks  FIM - Toileting Toileting: 0: No continent bowel/bladder events this shift  FIM - ArchivistToilet Transfers Toilet Transfers: 0-Activity did not occur  FIM - BankerBed/Chair Transfer Bed/Chair Transfer Assistive Devices: Bed rails, Arm rests Bed/Chair Transfer: 3: Supine > Sit: Mod A (lifting assist/Pt. 50-74%/lift 2 legs, 2: Bed > Chair or W/C: Max A (lift and lower assist)  FIM - Locomotion: Wheelchair Distance: 10 Locomotion: Wheelchair: 0: Activity did not occur FIM - Locomotion: Ambulation Locomotion: Ambulation Assistive Devices: Other (comment) (two person assist PT under L axilla, R HHA) Ambulation/Gait Assistance: 1: +2  Total assist Locomotion:  Ambulation: 0: Activity did not occur  Comprehension Comprehension Mode: Auditory Comprehension: 4-Understands basic 75 - 89% of the time/requires cueing 10 - 24% of the time  Expression Expression Mode: Verbal Expression: 5-Expresses basic needs/ideas: With extra time/assistive device  Social Interaction Social Interaction Mode: Asleep Social Interaction: 2-Interacts appropriately 25 - 49% of time - Needs frequent redirection.  Problem Solving Problem Solving Mode: Asleep Problem Solving: 3-Solves basic 50 - 74% of the time/requires cueing 25 - 49% of the time  Memory Memory Mode: Asleep Memory: 2-Recognizes or recalls 25 - 49% of the time/requires cueing 51 - 75% of the time  Medical Problem List and Plan: 1. Functional deficits secondary to right MCA infarct with dense left hemiparesis and visual-spatial deficits 2.  DVT Prophylaxis/Anticoagulation: Subcutaneous Lovenox. Monitor platelet counts and any signs of bleeding 3. Pain Management: Ultram as needed. 4. Mood/depression/dementia. Aricept 10 mg daily at bedtime, Effexor 75 mg daily. Discussed baseline cognition with family 5. Neuropsych: This patient is not capable of making decisions on her own behalf. 6. Skin/Wound Care: Routine skin checks as well as routine turning to maintain skin integrity 7. Fluids/Electrolytes/Nutrition: Follow-up chemistries. Strict I and O's.Hx CKD, monitor renal status 8. Dysphagia. Severe . Follow-up speech therapy. IV fluids to maintain hydration  swallowing recovery prognosis is guarded, intake also limited by fluctuating level of alertness 9. Hypothyroidism. Synthroid. Latest TSH level 2.929 10. GERD. Protonix 11. Hx CAD, ASA   12.  Mild hypokalemia LOS (Days) 13 A FACE TO FACE EVALUATION WAS PERFORMED  KIRSTEINS,ANDREW E 02/14/2014, 8:28 AM

## 2014-02-15 ENCOUNTER — Inpatient Hospital Stay (HOSPITAL_COMMUNITY): Payer: Medicare Other | Admitting: Occupational Therapy

## 2014-02-15 ENCOUNTER — Inpatient Hospital Stay (HOSPITAL_COMMUNITY): Payer: Medicare Other | Admitting: Speech Pathology

## 2014-02-15 ENCOUNTER — Inpatient Hospital Stay (HOSPITAL_COMMUNITY): Payer: Medicare Other | Admitting: Physical Therapy

## 2014-02-15 LAB — CREATININE, SERUM
Creatinine, Ser: 0.69 mg/dL (ref 0.50–1.10)
GFR calc Af Amer: 86 mL/min — ABNORMAL LOW (ref >90)
GFR, EST NON AFRICAN AMERICAN: 74 mL/min — AB (ref >90)

## 2014-02-15 NOTE — Plan of Care (Signed)
Problem: RH BOWEL ELIMINATION Goal: RH STG MANAGE BOWEL WITH ASSISTANCE STG Manage Bowel with max Assistance.  Outcome: Progressing  Problem: RH BLADDER ELIMINATION Goal: RH STG MANAGE BLADDER WITH ASSISTANCE STG Manage Bladder With max Assistance  Outcome: Progressing Goal: RH STG MANAGE BLADDER WITH MEDICATION WITH ASSISTANCE STG Manage Bladder With Medication With max Assistance.  Outcome: Progressing Goal: RH STG MANAGE BLADDER WITH EQUIPMENT WITH ASSISTANCE STG Manage Bladder With Equipment With min Assistance  Outcome: Progressing  Problem: RH SKIN INTEGRITY Goal: RH STG SKIN FREE OF INFECTION/BREAKDOWN Max Assist Outcome: Progressing  Problem: RH SAFETY Goal: RH STG ADHERE TO SAFETY PRECAUTIONS W/ASSISTANCE/DEVICE STG Adhere to Safety Precautions With Mod Assistance/Device.  Outcome: Progressing Goal: RH STG DECREASED RISK OF FALL WITH ASSISTANCE STG Decreased Risk of Fall With Fluor CorporationMod Assistance.  Outcome: Progressing  Problem: RH PAIN MANAGEMENT Goal: RH STG PAIN MANAGED AT OR BELOW PT'S PAIN GOAL <2  Outcome: Progressing

## 2014-02-15 NOTE — Plan of Care (Signed)
Problem: RH Swallowing Goal: LTG Patient will consume least restrictive PO diet (SLP) LTG: Patient will consume least restrictive PO diet with assist for use of compensatory strategies (SLP)  Outcome: Adequate for Discharge Pt is now comfort care and is discharging to hospice facility Goal: LTG Patient will participate in dysphagia therapy (SLP) LTG: Patient will participate in dysphagia therapy with assist to increase swallow function as evidenced by bedside or objective clinical assessment (SLP)  Outcome: Adequate for Discharge Pt is now comfort care and is discharging to hospice facility  Goal: LTG Patient will demonstrate a functional change in (SLP) LTG: Patient will demonstrate a functional change in oral/oropharyngeal swallow as evidenced by an objective assessment (SLP)  Outcome: Adequate for Discharge Pt is now comfort care and is discharging to hospice facility   Problem: RH Cognition - SLP Goal: RH LTG Patient will demonstrate orientation with cues LTG: Patient will demonstrate orientation to person/place/time/situation with cues (SLP)  Outcome: Adequate for Discharge Pt is now comfort care and is discharging to hospice facility   Problem: RH Expression Communication Goal: LTG Patient will increase speech intelligibility (SLP) LTG: Patient will increase speech intelligibility at word/phrase/conversation level with cues, % of the time (SLP)  Outcome: Adequate for Discharge Pt is now comfort care and is discharging to hospice facility   Problem: RH Problem Solving Goal: LTG Patient will demonstrate problem solving for (SLP) LTG: Patient will demonstrate problem solving for basic/complex daily situations with cues (SLP)  Outcome: Adequate for Discharge Pt is now comfort care and is discharging to hospice facility   Problem: RH Memory Goal: LTG Patient will demonstrate ability for day to day (SLP) LTG: Patient will demonstrate ability for day to day recall/carryover during  cognitive/linguistic activities with assist (SLP)  Outcome: Adequate for Discharge Pt is now comfort care and is discharging to hospice facility   Problem: RH Attention Goal: LTG Patient will demonstrate focused/sustained (SLP) LTG: Patient will demonstrate focused/sustained/selective/alternating/divided attention during cognitive/linguistic activities in specific environment with assist for # of minutes (SLP)  Outcome: Adequate for Discharge Pt is now comfort care and is discharging to hospice facility   Problem: RH Awareness Goal: LTG: Patient will demonstrate intellectual/emergent (SLP) LTG: Patient will demonstrate intellectual/emergent/anticipatory awareness with assist during a cognitive/linguistic activity (SLP)  Outcome: Adequate for Discharge Pt is now comfort care and is discharging to hospice facility

## 2014-02-15 NOTE — Progress Notes (Signed)
Speech Language Pathology Discharge Summary  Patient Details  Name: Sarah Daniel MRN: [id number] Date of Birth: 1922-01-15   Patient has met  0 of 9 long term goals.  Patient to discharge at overall Max level.  Reasons goals not met: Pt's current functional status is adequate for discharge as she is now comfort care and is discharging to hospice facility    Clinical Impression/Discharge Summary:  Pt made slow, limited gains while inpatient and is discharging to hospice facility with comfort care measures having met 0 out of 9 long term goals.  Pt is currently max assist for basic cognitive-linguistic tasks due to decreased initiation, sustained attention, and fluctuating alertness and orientation.  Pt also continues to present with s/s of a moderately severe oropharyngeal dysphagia with motor, sensory, and cognitive components. MBS completed while inpatient with recommendations for continued NPO.  Family declined PEG placement per pt's wishes and comfort feeds of pureed consistencies were initiated.  Family education is complete with all family's questions related to comfort feeds and cognition answered to their satisfaction at this time.  No further ST needs are indicated as pt is now receiving measures for comfort only.    Care Partner:  Caregiver Able to Provide Assistance: Other (comment) (Hospice facility )     Recommendation:  None (Hospice)     Equipment: none recommended by SLP    Reasons for discharge: Discharged from hospital   Patient/Family Agrees with Progress Made and Goals Achieved: Yes   See FIM for current functional status  Windell Moulding L 02/15/2014, 9:25 AM

## 2014-02-15 NOTE — Plan of Care (Signed)
Problem: RH Grooming Goal: LTG Patient will perform grooming w/assist,cues/equip (OT) LTG: Patient will perform grooming with assist, with/without cues using equipment (OT)  Outcome: Completed/Met Date Met:  02/15/14  Problem: RH Bathing Goal: LTG Patient will bathe with assist, cues/equipment (OT) LTG: Patient will bathe specified number of body parts with assist with/without cues using equipment (position) (OT)  Outcome: Completed/Met Date Met:  02/15/14  Problem: RH Dressing Goal: LTG Patient will perform upper body dressing (OT) LTG Patient will perform upper body dressing with assist, with/without cues (OT).  Outcome: Adequate for Discharge Goal: LTG Patient will perform lower body dressing w/assist (OT) LTG: Patient will perform lower body dressing with assist, with/without cues in positioning using equipment (OT)  Outcome: Adequate for Discharge  Problem: RH Toileting Goal: LTG Patient will perform toileting w/assist, cues/equip (OT) LTG: Patient will perform toiletiing (clothes management/hygiene) with assist, with/without cues using equipment (OT)  Outcome: Adequate for Discharge  Problem: RH Functional Use of Upper Extremity Goal: LTG Patient will use RT/LT upper extremity as a (OT) LTG: Patient will use right/left upper extremity as a stabilizer/gross assist/diminished/nondominant/dominant level with assist, with/without cues during functional activity (OT)  Outcome: Adequate for Discharge  Problem: RH Toilet Transfers Goal: LTG Patient will perform toilet transfers w/assist (OT) LTG: Patient will perform toilet transfers with assist, with/without cues using equipment (OT)  Outcome: Adequate for Discharge  Problem: RH Memory Goal: LTG Patient will demonstrate ability for day to day (OT) LTG: Patient will demonstrate ability for day to day recall/carryover during activities of daily living with assist (OT)  Outcome: Adequate for Discharge  Problem: RH Attention Goal:  LTG Patient will demonstrate focused/sustained (OT) LTG: Patient will demonstrate focused/sustained/selective/alternating/divided attention during functional activities in specific environment with assist for # of minutes (OT)  Outcome: Completed/Met Date Met:  02/15/14  Problem: RH Awareness Goal: LTG: Patient will demonstrate intellectual/emergent (OT) LTG: Patient will demonstrate intellectual/emergent/anticipatory awareness with assist during a functional activity (OT)  Outcome: Adequate for Discharge  Comments:  Change of plan with patient discharging to hospice care.

## 2014-02-15 NOTE — Progress Notes (Signed)
Patient discharged and transported via [facility]. Patient's belongings with the family.

## 2014-02-15 NOTE — Progress Notes (Signed)
Social Work Patient ID: Sarah Daniel, female   DOB: [birth year], 80 y.o.   MRN: [id number] Spoke with [facility]  Both daughter's are on board with this plan and will meet in am and complete paperwork. Will gather paperwork and set up transport.  MD and PA aware along with team.

## 2014-02-15 NOTE — Plan of Care (Signed)
Problem: RH KNOWLEDGE DEFICIT Goal: RH STG INCREASE KNOWLEDGE OF DIABETES Outcome: Not Applicable Date Met:  79/81/02 Goal: RH STG INCREASE KNOWLEDGE OF HYPERTENSION Outcome: Not Applicable Date Met:  54/86/28 Goal: RH STG INCREASE KNOWLEDGE OF DYSPHAGIA/FLUID INTAKE Outcome: Not Applicable Date Met:  24/17/53

## 2014-02-15 NOTE — Plan of Care (Signed)
Problem: RH PAIN MANAGEMENT Goal: RH STG PAIN MANAGED AT OR BELOW PT'S PAIN GOAL <2  Outcome: Completed/Met Date Met:  02/15/14 Medication managment

## 2014-02-15 NOTE — Progress Notes (Signed)
Social Work Discharge Note Discharge Note  The overall goal for the admission was met for:   Discharge location: [facility]  Length of Stay: Yes-14 DAYS  Discharge activity level: Yes-MIN/MOD LEVEL  Home/community participation: Yes  Services provided included: MD, RD, PT, OT, SLP, RN, CM, Pharmacy and SW  Financial Services: Private Insurance: [employer]  Follow-up services arranged: Other: [facility]  Comments (or additional information):PT AND FAMILY DECIDED AGAINST A PEG TUBE AND FAMILY IS FOLLOWING PT'S WISHES AND ADVANCED DIRECTIVES OF NO FEEDING TUBE. ALL DECIDED [facility] FOR PT.  Patient/Family verbalized understanding of follow-up arrangements: Yes  Individual responsible for coordination of the follow-up plan: DEE-DAUGHTER  Confirmed correct DME delivered: Elease Hashimoto 02/15/2014    Elease Hashimoto

## 2014-02-15 NOTE — Progress Notes (Signed)
Patient ID: Sarah Daniel, female   DOB: 01-18-1922, 80 y.o.   MRN: [id number]   Subjective/Complaints: NG tube has come out and not replaced IV out not replaced Comfort feeds limited by level of alertness Review of Systems - cannot obtain secondary to poor awareness     Objective: Vital Signs: Blood pressure 130/58, pulse 83, temperature 97.6 F (36.4 C), temperature source Oral, resp. rate 17, weight 37.8 kg (83 lb 5.3 oz), SpO2 99 %. No results found. Results for orders placed or performed during the hospital encounter of 02/01/14 (from the past 72 hour(s))  Creatinine, serum     Status: Abnormal   Collection Time: 02/15/14  5:55 AM  Result Value Ref Range   Creatinine, Ser 0.69 0.50 - 1.10 mg/dL   GFR calc non Af Amer 74 (L) >90 mL/min   GFR calc Af Amer 86 (L) >90 mL/min    Comment: (NOTE) The eGFR has been calculated using the CKD EPI equation. This calculation has not been validated in all clinical situations. eGFR's persistently <90 mL/min signify possible Chronic Kidney Disease.      HEENT: Left facial droop Cardio: RRR and no murmur Resp: CTA B/L and unlabored GI: BS positive and NT, ND Extremity:  No Edema Skin:   Bruise LUE and LLE Neuro: Confused, Flat, Cranial Nerve Abnormalities Left central VII, Abnormal Sensory Left hemisensory def, no AROM noted LUE this am shoulder add, 2- L HF, KE, synergy pattern  0/5 Left ankle, Dysarthric and Inattention Musc/Skel:  Other no pain with A/PROM of UE and LE NAD, severe R gaze pref Sparse verbal output- "good morming"  Poor sitting posture  Assessment/Plan: 1. 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  Awaiting [facility] transfer FIM: FIM - Bathing Bathing Steps Patient Completed: Chest, Left Arm, Abdomen, Front perineal area, Right upper leg, Left upper leg, Right lower leg (including foot), Left lower leg (including foot) Bathing: 4: Min-Patient completes 8-9 10f10 parts  or 75+ percent (at bed level)  FIM - Upper Body Dressing/Undressing Upper body dressing/undressing steps patient completed: Put head through opening of pull over shirt/dress Upper body dressing/undressing: 2: Max-Patient completed 25-49% of tasks FIM - Lower Body Dressing/Undressing Lower body dressing/undressing steps patient completed: Thread/unthread right pants leg Lower body dressing/undressing: 1: Total-Patient completed less than 25% of tasks  FIM - Toileting Toileting: 0: No continent bowel/bladder events this shift  FIM - TAir cabin crewTransfers: 0-Activity did not occur  FIM - BControl and instrumentation engineerDevices: Bed rails, Arm rests Bed/Chair Transfer: 3: Sit > Supine: Mod A (lifting assist/Pt. 50-74%/lift 2 legs), 2: Chair or W/C > Bed: Max A (lift and lower assist)  FIM - Locomotion: Wheelchair Distance: 15 Locomotion: Wheelchair: 1: Travels less than 50 ft with moderate assistance (Pt: 50 - 74%) FIM - Locomotion: Ambulation Locomotion: Ambulation Assistive Devices: Other (comment) (R HHA, therapist at L side) Ambulation/Gait Assistance: 1: +2 Total assist Locomotion: Ambulation: 1: Two helpers  Comprehension Comprehension Mode: Auditory Comprehension: 3-Understands basic 50 - 74% of the time/requires cueing 25 - 50%  of the time  Expression Expression Mode: Verbal Expression: 4-Expresses basic 75 - 89% of the time/requires cueing 10 - 24% of the time. Needs helper to occlude trach/needs to repeat words.  Social Interaction Social Interaction Mode: Asleep Social Interaction: 2-Interacts appropriately 25 - 49% of time - Needs frequent redirection.  Problem Solving Problem Solving Mode: Asleep Problem Solving: 2-Solves basic 25 - 49% of the time -  needs direction more than half the time to initiate, plan or complete simple activities  Memory Memory Mode: Asleep Memory: 2-Recognizes or recalls 25 - 49% of the time/requires cueing 51  - 75% of the time  Medical Problem List and Plan: 1. Functional deficits secondary to right MCA infarct with dense left hemiparesis and visual-spatial deficits 2.  DVT Prophylaxis/Anticoagulation: Subcutaneous Lovenox. Monitor platelet counts and any signs of bleeding 3. Pain Management: Ultram as needed. 4. Mood/depression/dementia. Aricept 10 mg daily at bedtime, Effexor 75 mg daily. Discussed baseline cognition with family 5. Neuropsych: This patient is not capable of making decisions on her own behalf. 6. Skin/Wound Care: Routine skin checks as well as routine turning to maintain skin integrity 7. Fluids/Electrolytes/Nutrition: Follow-up chemistries. Strict I and O's.Hx CKD, monitor renal status 8. Dysphagia. Severe . Follow-up speech therapy. IV fluids to maintain hydration  swallowing recovery prognosis is guarded, intake also limited by fluctuating level of alertness 9. Hypothyroidism. Synthroid. Latest TSH level 2.929 10. GERD. Protonix 11. Hx CAD, ASA   12.  Mild hypokalemia LOS (Days) 14 A FACE TO FACE EVALUATION WAS PERFORMED  Sarah Daniel E 02/15/2014, 8:25 AM

## 2014-02-15 NOTE — Discharge Summary (Signed)
NAME:  Sarah Daniel, Sarah Daniel                ACCOUNT NO.:  [id number]  MEDICAL RECORD NO.:  [id number]  LOCATION:  [id number]                        FACILITY:  [facility]  PHYSICIAN:  Erick ColaceAndrew E. Kirsteins, M.D.DATE OF BIRTH:  1921-10-08  DATE OF ADMISSION:  02/01/2014 DATE OF DISCHARGE:  02/15/2014                              DISCHARGE SUMMARY   DISCHARGE DIAGNOSES: 1. 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 2. Subcutaneous Lovenox for deep vein thrombosis prophylaxis. 3. Depression with history of dementia. 4. Decreased nutritional storage with severe dysphagia. 5. Hypothyroidism. 6. Gastroesophageal reflux disease. 7. History of coronary artery disease.  HISTORY OF PRESENT ILLNESS:  This is a 80 year old right-handed female with history of dementia, coronary artery disease, hypertension who lives with her daughter.  Presented on January 29, 2014, after being found down by her daughter with left-sided weakness and facial droop. The patient was a poor medical historian.  MRI/MRA of the brain showed acute nonhemorrhagic anterior right MCA territory infarct as well as occlusion of the right internal carotid artery with partial reconstitution of the cavernous and ophthalmic segments.  CT angiogram of the neck showing right internal carotid artery occlusion.  The patient did not receive tPA.  Echocardiogram with ejection fraction of 60% and grade 1 diastolic dysfunction.  EEG with mild diffuse slowing of the waking background without seizure activity.  Neurology consulted, placed on aspirin for CVA prophylaxis, subcutaneous Lovenox for DVT prophylaxis.  Swallow study per Speech Therapy with profound swallowing difficulties.  A nasogastric tube was placed for nutritional support. The patient was admitted for a comprehensive rehab program.  PAST MEDICAL HISTORY:  See discharge diagnoses.  SOCIAL HISTORY:  Lives with  her daughter.  FUNCTIONAL HISTORY:  Prior to admission, independent at home, unattended up to 4 hours a day.  Functional status upon admission to rehab services was moderate assist to ambulate 40 feet pushing IV pole, moderate assist supine to sit, moderate assist sit to supine, mod to max assist activities of daily living.  PHYSICAL EXAMINATION:  VITAL SIGNS:  Blood pressure 150/83, pulse 83, temperature 98, respirations 18. GENERAL:  This was an alert female, severe left inattention. EYES:  Pupils reactive to light. LUNGS:  Decreased breath sounds. CARDIAC:  Regular rate and rhythm. ABDOMEN:  Soft, nontender.  Good bowel sounds. NEURO:  She would not maintained neutral even when her head passively rotated to neutral.  She did follow inconsistent commands.  She would bowel some words, but mostly unintelligible at times.  A nasogastric tube was in place.  REHABILITATION HOSPITAL COURSE:  The patient was admitted to inpatient rehab services with therapies initiated on a 3-hour daily basis consisting of physical therapy, occupational therapy, speech therapy, and rehabilitation nursing.  The following issues were addressed during the patient's rehabilitation stay.  Pertaining to Sarah Daniel's right MCA infarct with dense left-sided weakness, visual spatial deficits she was on an aspirin suppository for CVA prophylaxis.  Subcutaneous Lovenox for DVT prophylaxis.  No bleeding episodes.  She did have a documented history of dementia, maintained on Aricept as prior to admission. Follow up with Speech Therapy for profound dysphagia.  A nasogastric tube was in place for nutritional support.  The patient had pulled out on numerous occasions.  This was discussed at length with family.  It was not felt she would be able to take any diet by mouth, there was some consideration of PEG tube being placed as family was adamantly against, thus Palliative Care was consulted to discuss all areas of the  patient's care.  Again, she had pulled out her nasogastric tube.  Family had requested to leave the tube out.  She was placed on intravenous fluids for supportive care.  Again, she pulled out her IV line and family again requested not to have this reinserted.  All issues in regards to her care were discussed per Palliative Care and multiple meetings were held with family for goals of care, [facility] made available on February 15, 2014, discharge taking place at that time.  DISCHARGE MEDICATIONS:  At the time of her discharge included: 1. Aspirin suppository 300 mg daily, rectally. 2. Synthroid 25 mcg intravenously daily.  Her diet was n.p.o.  All medication changes would be made once the [facility].     Sarah Daniel, P.A.   ______________________________ Erick ColaceAndrew E. Kirsteins, M.D.    DA/MEDQ  D:  02/15/2014  T:  02/15/2014  Job:  [id number]  cc:   Thora LanceJohn J. Griffin, M.D.

## 2014-02-15 NOTE — Progress Notes (Signed)
Physical Therapy Discharge Summary  Patient Details  Name: Sarah Daniel MRN: [id number] Date of Birth: December 02, 1921  Today's Date: 02/15/2014  Patient has met 2 of 14 long term goals due to improved attention and improved awareness.  Patient to discharge to hospice facility for ongoing comfort care.  Reasons goals not met: long term goals adequate for discharge due to plan to discharge to hospice facility.  Recommendation:  No follow up recommended due to discharge plan described above.  Equipment: No equipment provided  Reasons for discharge: discharge from hospital  Patient/family agrees with progress made and goals achieved: Yes  PT Discharge Precautions/Restrictions Precautions Precautions: Fall Precaution Comments: Severe Lt inattention Restrictions Weight Bearing Restrictions: No Vision/Perception  Vision - Assessment Additional Comments: Pt with Lt inattention and Rt visual gaze preference  Cognition Overall Cognitive Status: Impaired/Different from baseline Arousal/Alertness: Lethargic Orientation Level: Oriented to place;Oriented to person;Disoriented to time;Disoriented to situation Attention: Sustained Focused Attention: Appears intact Sustained Attention: Impaired Sustained Attention Impairment: Verbal basic;Functional basic Memory: Impaired Memory Impairment: Decreased short term memory;Decreased recall of new information;Storage deficit;Retrieval deficit Decreased Short Term Memory: Verbal basic;Functional basic Awareness: Impaired Awareness Impairment: Intellectual impairment Problem Solving: Impaired Problem Solving Impairment: Verbal basic;Functional basic Executive Function: Initiating;Self Monitoring;Decision Making Decision Making: Impaired Decision Making Impairment: Verbal basic;Functional basic Initiating: Impaired Initiating Impairment: Verbal basic;Functional basic Self Monitoring: Impaired Self Monitoring Impairment: Verbal basic;Functional  basic Safety/Judgment: Impaired Sensation Sensation Light Touch: Impaired Detail Light Touch Impaired Details: Absent LUE;Impaired LLE Stereognosis: Not tested Hot/Cold: Impaired Detail Hot/Cold Impaired Details: Impaired LUE;Impaired LLE Proprioception: Impaired Detail Proprioception Impaired Details: Absent LUE;Impaired LLE Coordination Gross Motor Movements are Fluid and Coordinated: No Fine Motor Movements are Fluid and Coordinated: No Coordination and Movement Description: significantly impaired on L side, LUE >LLE Motor  Motor Motor: Hemiplegia;Abnormal postural alignment and control Motor - Discharge Observations: L hemiplegia, R gaze preference  Mobility Bed Mobility Bed Mobility: Rolling Left;Rolling Right;Supine to Sit;Sit to Supine Rolling Right: With rail;3: Mod assist Rolling Right Details: Tactile cues for initiation;Manual facilitation for placement;Manual facilitation for weight shifting;Tactile cues for placement Rolling Left: With rail;4: Min assist;3: Mod assist Rolling Left Details: Tactile cues for initiation;Tactile cues for placement;Verbal cues for sequencing;Manual facilitation for weight shifting Rolling Left Details (indicate cue type and reason): Fluctuates between Min and Mod A Supine to Sit: HOB elevated;2: Max assist;With rails;3: Mod assist Supine to Sit Details: Tactile cues for placement;Tactile cues for initiation;Manual facilitation for weight shifting;Verbal cues for sequencing;Manual facilitation for placement Supine to Sit Details (indicate cue type and reason): Fluctuates between Mod and Max A Transfers Transfers: Yes Sit to Stand: 3: Mod assist;With armrests;From chair/3-in-1 Sit to Stand Details: Tactile cues for initiation;Manual facilitation for weight shifting;Tactile cues for weight beaing Stand to Sit: 3: Mod assist Stand to Sit Details (indicate cue type and reason): Verbal cues for precautions/safety;Manual facilitation for weight  shifting Stand Pivot Transfers: 2: Max assist Stand Pivot Transfer Details: Manual facilitation for weight shifting;Verbal cues for sequencing;Verbal cues for precautions/safety Squat Pivot Transfers: 3: Mod assist;2: Max Teacher, English as a foreign language Transfer Details: Verbal cues for sequencing;Manual facilitation for placement;Manual facilitation for weight shifting;Verbal cues for precautions/safety;Tactile cues for placement Squat Pivot Transfer Details (indicate cue type and reason): Fluctuates between Mod and Max A Locomotion  Ambulation Ambulation: Yes Ambulation/Gait Assistance: 1: +2 Total assist Ambulation Distance (Feet): 80 Feet Assistive device: 1 person hand held assist;Other (Comment) (R HHA and therapist postioned at L side) Ambulation/Gait Assistance Details: Manual facilitation for weight shifting;Verbal cues for  gait pattern;Tactile cues for posture Gait Gait: Yes Gait Pattern: Impaired Gait Pattern: Step-through pattern;Decreased step length - left;Decreased hip/knee flexion - left;Decreased dorsiflexion - left;Trunk flexed;Narrow base of support;Left flexed knee in stance;Decreased stance time - right;Decreased weight shift to right Stairs / Additional Locomotion Stairs: Yes Stairs Assistance: 2: Max assist Stairs Assistance Details: Manual facilitation for weight shifting;Tactile cues for posture;Manual facilitation for placement Stairs Assistance Details (indicate cue type and reason): Ascended forwards, descended backwards; manual facilitation of LLE clearance, placement on step during descent. Stair Management Technique: Step to pattern;Forwards;Seated/boosting;One rail Right Number of Stairs: 2 Wheelchair Mobility Wheelchair Mobility: Yes Wheelchair Assistance: 2: Max Technical sales engineer Details: Tactile cues for initiation;Tactile cues for posture;Tactile cues for placement;Verbal cues for technique Wheelchair Propulsion: Right upper extremity;Other (comment)  (Unable to reach RLE to ground in reclining back w/c) Wheelchair Parts Management: Needs assistance Distance: 15  Trunk/Postural Assessment  Cervical Assessment Cervical Assessment: Exceptions to [facility] (At rest, cervical spine rotated and laterally flexed to R side) Cervical AROM Overall Cervical AROM Comments: AROM to R WFL; unable to actively rotate head L to midline; Severe R rotation Thoracic Assessment Thoracic Assessment: Exceptions to [facility] (thoracic kyphosis) Thoracic AROM Overall Thoracic AROM Comments: Presents with kyphosis and slight R lateral rotation Lumbar Assessment Lumbar Assessment: Exceptions to [facility] (posterior pelvic tilt) Postural Control Postural Control: Deficits on evaluation Head Control: During standing/ambulation, pt able to maintain head at midline for up to 30 seconds prior to increasingly more limited extension. Trunk Control: In seated, pt with tendency toward LOB to L side. Righting Reactions: Impaired Protective Responses: Impaired  Balance Balance Balance Assessed: Yes Static Sitting Balance Static Sitting - Balance Support: Bilateral upper extremity supported;Feet supported Static Sitting - Level of Assistance: 4: Min assist Static Sitting - Comment/# of Minutes: Lateral trunk lean to L Dynamic Sitting Balance Dynamic Sitting - Balance Support: Bilateral upper extremity supported;Feet supported Dynamic Sitting - Level of Assistance: 4: Min assist;3: Mod assist Dynamic Sitting - Balance Activities: Reaching for objects;Forward lean/weight shifting Static Standing Balance Static Standing - Balance Support: Right upper extremity supported Static Standing - Level of Assistance: 4: Min assist Dynamic Standing Balance Dynamic Standing - Balance Support: Right upper extremity supported;During functional activity Dynamic Standing - Level of Assistance: 2: Max assist Extremity Assessment  RUE Assessment RUE Assessment: Within Functional Limits LUE  Assessment LUE Assessment: Exceptions to [facility] LUE Strength LUE Overall Strength Comments: 0/5 gross strength throughout - PROM WFLs LUE Tone LUE Tone: Flaccid RLE Assessment RLE Assessment: Within Functional Limits LLE Assessment LLE Assessment: Exceptions to [facility] LLE Strength LLE Overall Strength: Due to impaired cognition;Deficits LLE Overall Strength Comments: Unable to formally assess due to cognition and L inattention, demontrates poor-fair functional strength  See FIM for current functional status  Hobble, Malva Cogan 02/15/2014, 6:29 PM

## 2014-02-15 NOTE — Plan of Care (Signed)
Problem: RH BOWEL ELIMINATION Goal: RH STG MANAGE BOWEL WITH ASSISTANCE STG Manage Bowel with max Assistance.  Outcome: Not Met (add Reason) Pt SNF placement-incont of b/b total care required from staff managing b/b Goal: RH STG MANAGE BOWEL W/MEDICATION W/ASSISTANCE STG Manage Bowel with Medication with mod Assistance.  Outcome: Completed/Met Date Met:  02/15/14  Problem: RH BLADDER ELIMINATION Goal: RH STG MANAGE BLADDER WITH ASSISTANCE STG Manage Bladder With max Assistance  Outcome: Not Met (add Reason) Goal: RH STG MANAGE BLADDER WITH MEDICATION WITH ASSISTANCE STG Manage Bladder With Medication With max Assistance.  Outcome: Not Applicable Date Met:  88/91/69 Goal: RH STG MANAGE BLADDER WITH EQUIPMENT WITH ASSISTANCE STG Manage Bladder With Equipment With min Assistance  Outcome: Not Applicable Date Met:  45/03/88  Problem: RH SKIN INTEGRITY Goal: RH STG SKIN FREE OF INFECTION/BREAKDOWN Max Assist Outcome: Completed/Met Date Met:  02/15/14 Goal: RH STG MAINTAIN SKIN INTEGRITY WITH ASSISTANCE STG Maintain Skin Integrity With Max Assistance.  Outcome: Completed/Met Date Met:  02/15/14  Problem: RH SAFETY Goal: RH STG ADHERE TO SAFETY PRECAUTIONS W/ASSISTANCE/DEVICE STG Adhere to Safety Precautions With Mod Assistance/Device.  Outcome: Not Met (add Reason) Goal: RH STG DECREASED RISK OF FALL WITH ASSISTANCE STG Decreased Risk of Fall With Mod Assistance.  Outcome: Completed/Met Date Met:  02/15/14 Goal: RH STG DEMO UNDERSTANDING HOME SAFETY PRECAUTIONS Mod Assist  Outcome: Not Applicable Date Met:  82/80/03  Problem: RH COGNITION-NURSING Goal: RH STG USES MEMORY AIDS/STRATEGIES W/ASSIST TO PROBLEM SOLVE STG Uses Memory Aids/Strategies With Mod Assistance to Problem Solve.  Outcome: Completed/Met Date Met:  02/15/14 Goal: RH STG ANTICIPATES NEEDS/CALLS FOR ASSIST W/ASSIST/CUES STG Anticipates Needs/Calls for Assist With Mod Assistance/Cues.  Outcome: Completed/Met Date  Met:  02/15/14  Problem: RH PAIN MANAGEMENT Goal: RH STG PAIN MANAGED AT OR BELOW PT'S PAIN GOAL <2  Outcome: Completed/Met Date Met:  02/15/14

## 2014-02-15 NOTE — Plan of Care (Signed)
Problem: RH Balance Goal: LTG Patient will maintain dynamic sitting balance (PT) LTG: Patient will maintain dynamic sitting balance with assistance during mobility activities (PT)  Outcome: Adequate for Discharge Secondary to discharge to hospice facility for comfort care. Goal: LTG Patient will maintain dynamic standing balance (PT) LTG: Patient will maintain dynamic standing balance with assistance during mobility activities (PT)  Outcome: Adequate for Discharge Secondary to discharge to hospice facility for comfort care.  Problem: RH Bed Mobility Goal: LTG Patient will perform bed mobility with assist (PT) LTG: Patient will perform bed mobility with assistance, with/without cues (PT).  Outcome: Adequate for Discharge Secondary to discharge to hospice facility for comfort care.  Problem: RH Bed to Chair Transfers Goal: LTG Patient will perform bed/chair transfers w/assist (PT) LTG: Patient will perform bed/chair transfers with assistance, with/without cues (PT).  Outcome: Adequate for Discharge Secondary to discharge to hospice facility for comfort care.  Problem: RH Car Transfers Goal: LTG Patient will perform car transfers with assist (PT) LTG: Patient will perform car transfers with assistance (PT).  Outcome: Adequate for Discharge Secondary to discharge to hospice facility for comfort care.  Problem: RH Furniture Transfers Goal: LTG Patient will perform furniture transfers w/assist (OT/PT LTG: Patient will perform furniture transfers with assistance (OT/PT).  Outcome: Adequate for Discharge Secondary to discharge to hospice facility for comfort care.  Problem: RH Ambulation Goal: LTG Patient will ambulate in controlled environment (PT) LTG: Patient will ambulate in a controlled environment, # of feet with assistance (PT).  Outcome: Adequate for Discharge Secondary to discharge to hospice facility for comfort care. Goal: LTG Patient will ambulate in home environment  (PT) LTG: Patient will ambulate in home environment, # of feet with assistance (PT).  Outcome: Adequate for Discharge Secondary to discharge to hospice facility for comfort care.  Problem: RH Wheelchair Mobility Goal: LTG Patient will propel w/c in controlled environment (PT) LTG: Patient will propel wheelchair in controlled environment, # of feet with assist (PT)  Outcome: Adequate for Discharge Secondary to discharge to hospice facility for comfort care. Goal: LTG Patient will propel w/c in home environment (PT) LTG: Patient will propel wheelchair in home environment, # of feet with assistance (PT).  Outcome: Adequate for Discharge Secondary to discharge to hospice facility for comfort care.  Problem: RH Stairs Goal: LTG Patient will ambulate up and down stairs w/assist (PT) LTG: Patient will ambulate up and down # of stairs with assistance (PT)  Outcome: Adequate for Discharge Secondary to discharge to hospice facility for comfort care.  Problem: RH Memory Goal: LTG Patient demonstrate ability for day to day recall (PT) LTG: Patient will demonstrate ability for day to day recall/carryover during mobility activities with assist (PT)  Outcome: Adequate for Discharge Secondary to discharge to hospice facility for comfort care.  Problem: RH Attention Goal: LTG Patient will demonstrate focused/sustained (PT) LTG: Patient will demonstrate focused/sustained/selective/alternating/divided attention during functional mobility in specific environment with assist for # of minutes (PT)  Outcome: Completed/Met Date Met:  02/15/14  Problem: RH Awareness Goal: LTG: Patient will demonstrate intellectual/emergent (PT) LTG: Patient will demonstrate intellectual/emergent/anticipatory awareness with assist during a mobility activity (PT)  Outcome: Completed/Met Date Met:  02/15/14

## 2014-02-15 NOTE — Discharge Summary (Signed)
Discharge summary job # [id number]

## 2014-02-15 NOTE — Progress Notes (Signed)
Occupational Therapy Discharge Summary  Patient Details  Name: Sarah Daniel MRN: [id number] Date of Birth: 02-Sep-1921   Patient has met 3 of 10 long term goals due to improved attention.  Patient to discharge at overall Max Assist - Total assist level with transfers and dressing tasks.  Patient is now comfort care and is discharging to hospice facility.  Reasons goals not met: Pt's current functional status is adequate for discharge as she is now comfort care and is discharging to hospice facility.   Recommendation:  Patient is now comfort care.  Equipment: No equipment provided  Reasons for discharge: discharge from hospital  Patient/family agrees with progress made and goals achieved: Yes  OT Discharge Precautions/Restrictions  Precautions Precautions: Fall Precaution Comments: Severe Lt inattention ADL  See FIM Vision/Perception  Vision- Assessment Vision Assessment?: Vision impaired- to be further tested in functional context Additional Comments: Pt with Lt inattention and Rt visual gaze preference  Cognition Overall Cognitive Status: Impaired/Different from baseline Arousal/Alertness: Lethargic Orientation Level: Oriented to place;Oriented to person;Disoriented to time;Disoriented to situation Attention: Sustained Focused Attention: Appears intact Sustained Attention: Impaired Sustained Attention Impairment: Verbal basic;Functional basic Memory: Impaired Memory Impairment: Decreased short term memory;Decreased recall of new information;Storage deficit;Retrieval deficit Decreased Short Term Memory: Verbal basic;Functional basic Awareness: Impaired Awareness Impairment: Intellectual impairment Problem Solving: Impaired Problem Solving Impairment: Verbal basic;Functional basic Executive Function: Initiating;Self Monitoring;Decision Making Decision Making: Impaired Decision Making Impairment: Verbal basic;Functional basic Initiating: Impaired Initiating Impairment:  Verbal basic;Functional basic Self Monitoring: Impaired Self Monitoring Impairment: Verbal basic;Functional basic Safety/Judgment: Impaired Sensation Sensation Light Touch: Impaired Detail Light Touch Impaired Details: Absent LUE;Impaired LLE Stereognosis: Not tested Hot/Cold: Impaired Detail Hot/Cold Impaired Details: Impaired LUE;Impaired LLE Proprioception: Impaired Detail Proprioception Impaired Details: Absent LUE;Impaired LLE Coordination Gross Motor Movements are Fluid and Coordinated: No Fine Motor Movements are Fluid and Coordinated: No Coordination and Movement Description: significantly impaired on L side, LUE >LLE  Trunk/Postural Assessment  Cervical AROM Overall Cervical AROM Comments: AROM to R WFL; unable to actively rotate head L to midline; Severe R rotation Thoracic AROM Overall Thoracic AROM Comments: Presents with kyphosis and slight R lateral rotation  Balance Static Sitting Balance Static Sitting - Balance Support: Bilateral upper extremity supported;Feet supported Static Sitting - Level of Assistance: 4: Min assist Static Sitting - Comment/# of Minutes: Lt lean Dynamic Sitting Balance Dynamic Sitting - Balance Support: Bilateral upper extremity supported;Feet supported Dynamic Sitting - Level of Assistance: 4: Min assist;3: Mod assist Dynamic Sitting - Balance Activities: Reaching for objects;Forward lean/weight shifting Extremity/Trunk Assessment RUE Assessment RUE Assessment: Within Functional Limits LUE Assessment LUE Assessment: Exceptions to [facility] LUE Strength LUE Overall Strength Comments: 0/5 gross strength throughout - PROM WFLs LUE Tone LUE Tone: Flaccid  See FIM for current functional status  Sarah Daniel, [facility] 02/15/2014, 3:49 PM

## 2014-03-08 DEATH — deceased
